# Patient Record
Sex: Female | Born: 1955 | Race: White | Hispanic: No | Smoking: Current every day smoker
Health system: Southern US, Community
[De-identification: ages and names within clinical notes are randomized; demographics above are authoritative.]

## PROBLEM LIST (undated history)

## (undated) DIAGNOSIS — M1990 Unspecified osteoarthritis, unspecified site: Secondary | ICD-10-CM

## (undated) DIAGNOSIS — I509 Heart failure, unspecified: Secondary | ICD-10-CM

## (undated) DIAGNOSIS — I10 Essential (primary) hypertension: Secondary | ICD-10-CM

---

## 1998-08-31 ENCOUNTER — Other Ambulatory Visit: Admission: RE | Admit: 1998-08-31 | Discharge: 1998-08-31 | Payer: Self-pay | Admitting: Obstetrics & Gynecology

## 1998-09-01 ENCOUNTER — Inpatient Hospital Stay (HOSPITAL_COMMUNITY): Admission: AD | Admit: 1998-09-01 | Discharge: 1998-09-01 | Payer: Self-pay | Admitting: Obstetrics & Gynecology

## 2000-04-27 ENCOUNTER — Other Ambulatory Visit: Admission: RE | Admit: 2000-04-27 | Discharge: 2000-04-27 | Payer: Self-pay | Admitting: Obstetrics & Gynecology

## 2001-09-13 ENCOUNTER — Other Ambulatory Visit: Admission: RE | Admit: 2001-09-13 | Discharge: 2001-09-13 | Payer: Self-pay | Admitting: Obstetrics & Gynecology

## 2001-10-11 ENCOUNTER — Encounter: Admission: RE | Admit: 2001-10-11 | Discharge: 2001-10-11 | Payer: Self-pay

## 2004-07-10 ENCOUNTER — Other Ambulatory Visit: Admission: RE | Admit: 2004-07-10 | Discharge: 2004-07-10 | Payer: Self-pay | Admitting: Internal Medicine

## 2004-08-19 ENCOUNTER — Encounter: Admission: RE | Admit: 2004-08-19 | Discharge: 2004-08-19 | Payer: Self-pay | Admitting: Internal Medicine

## 2012-03-22 ENCOUNTER — Other Ambulatory Visit: Payer: Self-pay | Admitting: Family Medicine

## 2012-03-22 DIAGNOSIS — Z780 Asymptomatic menopausal state: Secondary | ICD-10-CM

## 2012-03-22 DIAGNOSIS — Z1231 Encounter for screening mammogram for malignant neoplasm of breast: Secondary | ICD-10-CM

## 2012-04-19 ENCOUNTER — Ambulatory Visit: Payer: Self-pay

## 2012-04-19 ENCOUNTER — Other Ambulatory Visit: Payer: Self-pay

## 2012-05-17 ENCOUNTER — Ambulatory Visit
Admission: RE | Admit: 2012-05-17 | Discharge: 2012-05-17 | Disposition: A | Discharge status: Home / Self Care | Payer: PRIVATE HEALTH INSURANCE | Source: Ambulatory Visit | Attending: Family Medicine | Admitting: Family Medicine

## 2012-05-17 DIAGNOSIS — Z1231 Encounter for screening mammogram for malignant neoplasm of breast: Secondary | ICD-10-CM

## 2013-12-16 ENCOUNTER — Encounter (HOSPITAL_COMMUNITY): Payer: Self-pay | Admitting: Emergency Medicine

## 2013-12-16 ENCOUNTER — Emergency Department (HOSPITAL_COMMUNITY)
Admission: EM | Admit: 2013-12-16 | Discharge: 2013-12-16 | Disposition: A | Discharge status: Home / Self Care | Payer: PRIVATE HEALTH INSURANCE | Attending: Emergency Medicine | Admitting: Emergency Medicine

## 2013-12-16 ENCOUNTER — Emergency Department (HOSPITAL_COMMUNITY): Payer: PRIVATE HEALTH INSURANCE

## 2013-12-16 DIAGNOSIS — K625 Hemorrhage of anus and rectum: Secondary | ICD-10-CM | POA: Insufficient documentation

## 2013-12-16 DIAGNOSIS — M1990 Unspecified osteoarthritis, unspecified site: Secondary | ICD-10-CM | POA: Diagnosis not present

## 2013-12-16 DIAGNOSIS — H6123 Impacted cerumen, bilateral: Secondary | ICD-10-CM | POA: Diagnosis not present

## 2013-12-16 DIAGNOSIS — Z7982 Long term (current) use of aspirin: Secondary | ICD-10-CM | POA: Diagnosis not present

## 2013-12-16 DIAGNOSIS — Z720 Tobacco use: Secondary | ICD-10-CM | POA: Diagnosis not present

## 2013-12-16 DIAGNOSIS — Z79899 Other long term (current) drug therapy: Secondary | ICD-10-CM | POA: Insufficient documentation

## 2013-12-16 DIAGNOSIS — R42 Dizziness and giddiness: Secondary | ICD-10-CM | POA: Diagnosis present

## 2013-12-16 DIAGNOSIS — R935 Abnormal findings on diagnostic imaging of other abdominal regions, including retroperitoneum: Secondary | ICD-10-CM | POA: Diagnosis not present

## 2013-12-16 DIAGNOSIS — R55 Syncope and collapse: Secondary | ICD-10-CM | POA: Diagnosis not present

## 2013-12-16 HISTORY — DX: Unspecified osteoarthritis, unspecified site: M19.90

## 2013-12-16 HISTORY — DX: Essential (primary) hypertension: I10

## 2013-12-16 LAB — COMPREHENSIVE METABOLIC PANEL
ALBUMIN: 4.2 g/dL (ref 3.5–5.2)
ALT: 22 U/L (ref 0–35)
ANION GAP: 12 (ref 5–15)
AST: 35 U/L (ref 0–37)
Alkaline Phosphatase: 63 U/L (ref 39–117)
BUN: 13 mg/dL (ref 6–23)
CO2: 27 mEq/L (ref 19–32)
CREATININE: 0.97 mg/dL (ref 0.50–1.10)
Calcium: 8.9 mg/dL (ref 8.4–10.5)
Chloride: 96 mEq/L (ref 96–112)
GFR calc Af Amer: 74 mL/min — ABNORMAL LOW (ref >90)
GFR calc non Af Amer: 64 mL/min — ABNORMAL LOW (ref >90)
Glucose, Bld: 67 mg/dL — ABNORMAL LOW (ref 70–99)
Potassium: 4.6 mEq/L (ref 3.7–5.3)
Sodium: 135 mEq/L — ABNORMAL LOW (ref 137–147)
TOTAL PROTEIN: 7.9 g/dL (ref 6.0–8.3)
Total Bilirubin: 0.4 mg/dL (ref 0.3–1.2)

## 2013-12-16 LAB — URINALYSIS, ROUTINE W REFLEX MICROSCOPIC
Bilirubin Urine: NEGATIVE
Glucose, UA: NEGATIVE mg/dL
Hgb urine dipstick: NEGATIVE
Ketones, ur: NEGATIVE mg/dL
LEUKOCYTES UA: NEGATIVE
Nitrite: NEGATIVE
PH: 6 (ref 5.0–8.0)
PROTEIN: NEGATIVE mg/dL
Specific Gravity, Urine: 1.006 (ref 1.005–1.030)
Urobilinogen, UA: 0.2 mg/dL (ref 0.0–1.0)

## 2013-12-16 LAB — CBC WITH DIFFERENTIAL/PLATELET
BASOS PCT: 0 % (ref 0–1)
Basophils Absolute: 0 10*3/uL (ref 0.0–0.1)
Eosinophils Absolute: 0.2 10*3/uL (ref 0.0–0.7)
Eosinophils Relative: 3 % (ref 0–5)
HEMATOCRIT: 42.9 % (ref 36.0–46.0)
Hemoglobin: 15.1 g/dL — ABNORMAL HIGH (ref 12.0–15.0)
LYMPHS ABS: 1.3 10*3/uL (ref 0.7–4.0)
LYMPHS PCT: 18 % (ref 12–46)
MCH: 36.1 pg — ABNORMAL HIGH (ref 26.0–34.0)
MCHC: 35.2 g/dL (ref 30.0–36.0)
MCV: 102.6 fL — ABNORMAL HIGH (ref 78.0–100.0)
MONO ABS: 0.5 10*3/uL (ref 0.1–1.0)
MONOS PCT: 6 % (ref 3–12)
Neutro Abs: 5.7 10*3/uL (ref 1.7–7.7)
Neutrophils Relative %: 73 % (ref 43–77)
Platelets: 149 10*3/uL — ABNORMAL LOW (ref 150–400)
RBC: 4.18 MIL/uL (ref 3.87–5.11)
RDW: 13.3 % (ref 11.5–15.5)
WBC: 7.7 10*3/uL (ref 4.0–10.5)

## 2013-12-16 LAB — ETHANOL

## 2013-12-16 LAB — I-STAT CHEM 8, ED
BUN: 13 mg/dL (ref 6–23)
CALCIUM ION: 1.09 mmol/L — AB (ref 1.12–1.23)
CHLORIDE: 98 meq/L (ref 96–112)
Creatinine, Ser: 1.1 mg/dL (ref 0.50–1.10)
GLUCOSE: 71 mg/dL (ref 70–99)
HCT: 49 % — ABNORMAL HIGH (ref 36.0–46.0)
Hemoglobin: 16.7 g/dL — ABNORMAL HIGH (ref 12.0–15.0)
Potassium: 4.1 mEq/L (ref 3.7–5.3)
Sodium: 135 mEq/L — ABNORMAL LOW (ref 137–147)
TCO2: 26 mmol/L (ref 0–100)

## 2013-12-16 LAB — CBG MONITORING, ED: GLUCOSE-CAPILLARY: 78 mg/dL (ref 70–99)

## 2013-12-16 LAB — I-STAT TROPONIN, ED: Troponin i, poc: 0.02 ng/mL (ref 0.00–0.08)

## 2013-12-16 LAB — I-STAT CG4 LACTIC ACID, ED: Lactic Acid, Venous: 1.25 mmol/L (ref 0.5–2.2)

## 2013-12-16 MED ORDER — SODIUM CHLORIDE 0.9 % IV SOLN
1000.0000 mL | Freq: Once | INTRAVENOUS | Status: AC
  Administered 2013-12-16: 1000 mL via INTRAVENOUS

## 2013-12-16 MED ORDER — IOHEXOL 300 MG/ML  SOLN
25.0000 mL | Freq: Once | INTRAMUSCULAR | Status: AC | PRN
  Administered 2013-12-16: 25 mL via ORAL

## 2013-12-16 MED ORDER — SODIUM CHLORIDE 0.9 % IV BOLUS (SEPSIS)
1000.0000 mL | Freq: Once | INTRAVENOUS | Status: AC
  Administered 2013-12-16: 1000 mL via INTRAVENOUS

## 2013-12-16 MED ORDER — IOHEXOL 300 MG/ML  SOLN
100.0000 mL | Freq: Once | INTRAMUSCULAR | Status: AC | PRN
  Administered 2013-12-16: 100 mL via INTRAVENOUS

## 2013-12-16 MED ORDER — SODIUM CHLORIDE 0.9 % IV SOLN
1000.0000 mL | INTRAVENOUS | Status: DC
  Administered 2013-12-16: 1000 mL via INTRAVENOUS

## 2013-12-16 NOTE — ED Notes (Signed)
Pt ambulated to restroom with steady gait.

## 2013-12-16 NOTE — ED Notes (Signed)
Pt returned from CT scan.

## 2013-12-16 NOTE — ED Notes (Signed)
Receive pt via EMS with c/o feeling lightheaded between the hours of 7-9 while at work. Pt continued to work until symptoms increased and she felt as if she was going to pass out. Pt went to bathroom and removed a girdle which made her feel a little better. Pt reports that she had some diarrhea normal in color. Co-workers reported that pt was pale and clammy. 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 Pt ate a banana and 3 mini muffins for breakfast with juice.

## 2013-12-16 NOTE — ED Provider Notes (Signed)
CSN: [id number]     Arrival date & time 12/16/13  1023 History   First MD Initiated Contact with Patient 12/16/13 1028     Chief Complaint  Patient presents with  . Dizziness     (Consider location/radiation/quality/duration/timing/severity/associated sxs/prior Treatment) HPI  58 year old female was brought here via EMS with near-syncopal episode. Patient reports she felt fine last night. This morning she woke up and went to work without any problem. 2 hours into her work she developed sensation of lightheadedness, skin felt cool and clammy and report diaphoresis. She continued to work but her symptoms progressed. She went to the bathroom and removed a girdle around her abdomen which makes her feels a bit better. She does not normally wear a girdle. She report having a small amounts of loose stool while in the bathroom. Denies any blood or mucus in it. Patient was walking out of the bathroom but felt as if she was going to pass out, she sat down without passing out. EMS was called. 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 EMS recording of her blood pressure was 104/70. Patient report eating breakfast this morning. She denies having any vertiginous symptoms. Reports mild urinary frequency several days ago but does not think she has a urinary tract infection. She denies any headache, double vision, neck pain, chest pain, shortness of breath, productive cough, nausea or vomiting, back pain, abnormal bleeding, or rash. She does not have a primary care Dr. Patient states she gets going through the menopause and hasn't been feeling herself. Does admit to drinking 3-4 beers nightly.  Report intermittent constipation and diarrhea.  Report having dark stools on occasion, last episode 3 weeks ago.    Past Medical History  Diagnosis Date  . Arthritis    History reviewed. No pertinent past surgical history. No family history on file. History  Substance  Use Topics  . Smoking status: Current Every Day Smoker  . Smokeless tobacco: Not on file  . Alcohol Use: Yes   OB History   Grav Para Term Preterm Abortions TAB SAB Ect Mult Living                 Review of Systems  All other systems reviewed and are negative.     Allergies  Review of patient's allergies indicates no known allergies.  Home Medications   Prior to Admission medications   Medication Sig Start Date End Date Taking? Authorizing Provider  aspirin EC 81 MG tablet Take 81 mg by mouth daily.   Yes Historical Provider, MD  Multiple Vitamin (MULTIVITAMIN WITH MINERALS) TABS tablet Take 1 tablet by mouth daily.   Yes Historical Provider, MD  naphazoline-pheniramine (NAPHCON-A) 0.025-0.3 % ophthalmic solution Place 2 drops into both eyes 4 (four) times daily as needed for irritation.   Yes Historical Provider, MD   BP 123/87  Pulse 88  Temp(Src) 97.6 F (36.4 C) (Oral)  Resp 24  Ht 5\' 6"  (1.676 m)  Wt 156 lb (70.761 kg)  BMI 25.19 kg/m2  SpO2 100% Physical Exam  Nursing note and vitals reviewed. Constitutional: She is oriented to person, place, and time. She appears well-developed and well-nourished. No distress.  HENT:  Head: Atraumatic.  Ears: Cerumen impaction to both ears.  Eyes: Conjunctivae and EOM are normal. Pupils are equal, round, and reactive to light.  Neck: Neck supple. No JVD present.  Cardiovascular: Normal rate and regular rhythm.   Pulmonary/Chest: Effort normal and breath sounds  normal. No respiratory distress.  Abdominal: Soft. There is no tenderness.  Neurological: She is alert and oriented to person, place, and time.  Neurologic exam:  Speech clear, pupils equal round reactive to light, extraocular movements intact  Normal peripheral visual fields Cranial nerves III through XII normal including no facial droop Follows commands, moves all extremities x4, normal strength to bilateral upper and lower extremities at all major muscle groups  including grip Sensation normal to light touch  Coordination intact, no limb ataxia, finger-nose-finger normal No pronator drift Gait normal   Skin: No rash noted.  Psychiatric: She has a normal mood and affect.    ED Course  Procedures (including critical care time)  11:00 AM Patient here with a near syncopal episode. She has no vertiginous symptoms and no focal neuro deficit on exam. She felt better without any specific treatment aside from removing a girdle that she was wearing. Near syncope workup initiated.  2:36 PM Patient is afebrile with normal vital sign. Normal orthostatic vital sign as  Well.  UA without evidence of urinary tract infection, other electrolytes are reassuring, her EKG is unremarkable. Abdominal and pelvis CT scan demonstrates moderate right hydronephrosis and hydroureter to the level of S1 where there is an abrupt transition to normal caliber ureter. This appearance can be seen with a stricture but a ureteral lesion cannot be excluded. The remainder of the CT scan was unremarkable. I discussed findings with patient with recommendation for close follow up with urology, patient agrees. For her intermittent rectal bleeding, and without evidence of anemia, patient will followup with allergy and for further care. Resources provided. Alcohol cessation discussed. Return precautions discussed. Suspect symptoms likely from vasovagal, for wearing a tight girdle.   Labs Review Labs Reviewed  CBC WITH DIFFERENTIAL - Abnormal; Notable for the following:    Hemoglobin 15.1 (*)    MCV 102.6 (*)    MCH 36.1 (*)    Platelets 149 (*)    All other components within normal limits  COMPREHENSIVE METABOLIC PANEL - Abnormal; Notable for the following:    Sodium 135 (*)    Glucose, Bld 67 (*)    GFR calc non Af Amer 64 (*)    GFR calc Af Amer 74 (*)    All other components within normal limits  I-STAT CHEM 8, ED - Abnormal; Notable for the following:    Sodium 135 (*)    Calcium,  Ion 1.09 (*)    Hemoglobin 16.7 (*)    HCT 49.0 (*)    All other components within normal limits  URINALYSIS, ROUTINE W REFLEX MICROSCOPIC  ETHANOL  I-STAT CG4 LACTIC ACID, ED  I-STAT TROPOININ, ED  CBG MONITORING, ED    Imaging Review Ct Abdomen Pelvis W Contrast  12/16/2013   CLINICAL DATA:  Dizziness, diarrhea, low abdominal pain  EXAM: CT ABDOMEN AND PELVIS WITH CONTRAST  TECHNIQUE: Multidetector CT imaging of the abdomen and pelvis was performed using the standard protocol following bolus administration of intravenous contrast.  CONTRAST:  OMNIPAQUE IOHEXOL 300 MG/ML  SOLN  COMPARISON:  None.  FINDINGS: The lung bases are clear.  The liver demonstrates no focal abnormality. There is no intrahepatic or extrahepatic biliary ductal dilatation. The gallbladder is normal. The spleen demonstrates no focal abnormality.There is moderate right hydronephrosis and hydroureter to the level of S1 where there is transition to a normal caliber ureter. The left kidney, adrenal glands and pancreas are normal. The bladder is unremarkable.  Small hiatal hernia. The  stomach, duodenum, small intestine, and large intestine demonstrate no contrast extravasation or dilatation. There is no pneumoperitoneum, pneumatosis, or portal venous gas. There is no abdominal or pelvic free fluid. There is no lymphadenopathy.  The abdominal aorta is normal in caliber.  There are no lytic or sclerotic osseous lesions.  IMPRESSION: 1. Moderate right hydronephrosis and hydroureter to the level of S1 where there is abrupt transition to a normal caliber ureter. This appearance can be seen with a stricture but a ureteral lesion cannot be excluded.   Electronically Signed   By: Elige Ko   On: 12/16/2013 14:05     EKG Interpretation   Date/Time:  Friday December 16 2013 10:28:52 EDT Ventricular Rate:  87 PR Interval:  127 QRS Duration: 72 QT Interval:  389 QTC Calculation: 468 R Axis:   28 Text Interpretation:  Sinus  rhythm Probable left atrial enlargement  Probable anteroseptal infarct, old Electrode noise No old tracing to  compare Confirmed by KNAPP  MD-I, IVA ([id number]) on 12/16/2013 11:55:36 AM      MDM   Final diagnoses:  Abnormal abdominal CT scan  Rectal bleeding  Near syncope    BP 126/89  Pulse 95  Temp(Src) 98 F (36.7 C) (Oral)  Resp 18  Ht 5\' 6"  (1.676 m)  Wt 156 lb (70.761 kg)  BMI 25.19 kg/m2  SpO2 99%  I have reviewed nursing notes and vital signs. I personally reviewed the imaging tests through PACS system  I reviewed available ER/hospitalization records thought the EMR    Fayrene Helper, [place] 12/16/13 1439

## 2013-12-16 NOTE — ED Provider Notes (Signed)
Patient presents after having a near syncopal episode at work today. She states she initially got to work at 7 AM and she started feeling lightheaded like she was going to pass out. She states it got very intense about 8:30 she had to sit down. She states she feels drained of all her energy. She states she feels cold and she feels hot. She reports sweating. She reports for the past year she's been having constipation alternating with diarrhea. She states sometimes she has black stools and other times she has bright red rectal bleeding. She states the last time she had a black stool was about 10 days ago and the last time she had rectal bleeding was about 3 weeks ago when she had a severe episode of diarrhea. She denies any weight loss. She states she's eating normally. She denies chest pain or shortness of breath but does have some lower abdominal discomfort. She had nausea today without vomiting or diarrhea. She denies having actual loss of consciousness. EMS reports her initial blood pressure was in the 70s. She states she refused IV fluids because she thought she could drink. She reports she used to take [facility] powders however when she stopped them rectal bleeding seem to improve.  PCP none  Patient reports she smokes one half to one pack a day. Patient works in nursing home. She states she drinks 3-6 beers daily.  On exam patient has a mild paleness however she is not pale in her palms or her conjunctiva. Her lungs are clear, her abdomen is soft with active bowel sounds and minimal lower abdominal discomfort to palpation.   Medical screening examination/treatment/procedure(s) were conducted as a shared visit with non-physician practitioner(s) and myself.  I personally evaluated the patient during the encounter.   EKG Interpretation   Date/Time:  Friday December 16 2013 10:28:52 EDT Ventricular Rate:  87 PR Interval:  127 QRS Duration: 72 QT Interval:  389 QTC Calculation: 468 R Axis:   28 Text  Interpretation:  Sinus rhythm Probable left atrial enlargement  Probable anteroseptal infarct, old Electrode noise No old tracing to  compare Confirmed by Davian Hanshaw  MD-I, Topaz Raglin ([id number]) on 12/16/2013 11:55:36 AM       Devoria Albe, MD, Franz Dell, MD 12/16/13 [phone]

## 2013-12-16 NOTE — Discharge Instructions (Signed)
You have been evaluated for your near passing out episode.  Your work up has been unremarkable.  There is an abnormal finding on your abdominal CT scan which demonstrates an enlargement of your right ureter.  Please follow up with [facility] for further evaluation.  As for your rectal bleeding, follow up closely with [facility] if bleeding worsening.  Use resources below to find a primary care provider.  Return if you have any concerns.   Near-Syncope Near-syncope (commonly known as near fainting) is sudden weakness, dizziness, or feeling like you might pass out. This can happen when getting up or while standing for a long time. It is caused by a sudden decrease in blood flow to the brain, which can occur for various reasons. Most of the reasons are not serious.  HOME CARE Watch your condition for any changes.  Have someone stay with you until you feel stable.  If you feel like you are going to pass out:  Lie down right away.  Prop your feet up if you can.  Breathe deeply and steadily.  Move only when the feeling has gone away. Most of the time, this feeling lasts only a few minutes. You may feel tired for several hours.  Drink enough fluids to keep your pee (urine) clear or pale yellow.  If you are taking blood pressure or heart medicine, stand up slowly.  Follow up with your doctor as told. GET HELP RIGHT AWAY IF:   You have a severe headache.  You have unusual pain in the chest, belly (abdomen), or back.  You have bleeding from the mouth or butt (rectum), or you have black or tarry poop (stool).  You feel your heart beat differently than normal, or you have a very fast pulse.  You pass out, or you twitch and shake when you pass out.  You pass out when sitting or lying down.  You feel confused.  You have trouble walking.  You are weak.  You have vision problems. MAKE SURE YOU:   Understand these instructions.  Will watch your condition.  Will get help right  away if you are not doing well or get worse. Document Released: 08/13/2007 Document Revised: 03/01/2013 Document Reviewed: 07/30/2012 [facility] Patient Information 2015 [place], [place]. This information is not intended to replace advice given to you by your health care provider. Make sure you discuss any questions you have with your health care provider.   Emergency Department Resource Guide 1) Find a Doctor and Pay Out of Pocket Although you won't have to find out who is covered by your insurance plan, it is a good idea to ask around and get recommendations. You will then need to call the office and see if the doctor you have chosen will accept you as a new patient and what types of options they offer for patients who are self-pay. Some doctors offer discounts or will set up payment plans for their patients who do not have insurance, but you will need to ask so you aren't surprised when you get to your appointment.  2) Contact Your Local Health Department Not all health departments have doctors that can see patients for sick visits, but many do, so it is worth a call to see if yours does. If you don't know where your local health department is, you can check in your phone book. The CDC also has a tool to help you locate your state's health department, and many state websites also have listings of all  of their local health departments.  3) Find a Walk-in Clinic If your illness is not likely to be very severe or complicated, you may want to try a walk in clinic. These are popping up all over the country in pharmacies, drugstores, and shopping centers. They're usually staffed by nurse practitioners or physician assistants that have been trained to treat common illnesses and complaints. They're usually fairly quick and inexpensive. However, if you have serious medical issues or chronic medical problems, these are probably not your best option.  No Primary Care Doctor: - Call Health Connect at  [phone] -  they can help you locate a primary care doctor that  accepts your insurance, provides certain services, etc. - Physician Referral Service- [phone]  Chronic Pain Problems: Organization         Address  Phone   Notes  [facility]  [phone] Patients need to be referred by their primary care doctor.   Medication Assistance: Organization         Address  Phone   Notes  [facility] Medication [facility] [address] [place], [place] [address] [phone] --Must be a resident of [facility] -- Must have NO insurance coverage whatsoever (no Medicaid/ Medicare, etc.) -- The pt. MUST have a primary care doctor that directs their care regularly and follows them in the community   [facility]ist  [phone]   [employer]  [phone]    Agencies that provide inexpensive medical care: Organization         Address  Phone   Notes  [facility]  [phone]   [facility]    [phone]   [facility] [address] [place], [place] [address] [phone]   [facility] [address] [place], [place] [phone]   Planned Parenthood    [phone]   [facility]    [phone]   [facility] and [facility]  [address], [place] Phone:  [phone], Fax:  [phone] Hours of Operation:  9 am - 6 pm, M-F.  Also accepts Medicaid/Medicare and self-pay.  [facility]  [address], [place] Phone: [phone], Fax: [phone]. Hours of Operation:  8:30 am - 5:30 pm, M-F.  Also accepts Medicaid and self-pay.  [facility] [place] [address] [place] Phone: [phone]   [facility] [address] Natasha Bence [place], [place] [phone], Ext. [phone] Mondays & Thursdays: 7-9 AM.  First 15 patients are seen on a first come, first serve basis.    Medicaid-accepting  [facility] Providers:  Organization         Address  Phone   Notes  [facility] [address], [place] [phone] Also accepts self-pay patients.  [facility] [address] Laurell Josephs [place], [place]  [phone]   [facility] [address], [place] [phone]   [facility] [address], [place] [phone]   Renaye Rakers [address], [place]   [phone] Only accepts [place] patients after they have their name applied to their card.   Self-Pay (no insurance) in [facility]:  Organization         Address  Phone   Notes  Sickle  Cell Patients, [facility] [address] [place], [place] [phone]   [facility] [address] [place], [place] [phone]   [facility]  [address], [place] [phone]   [facility]/Dr. Osei-Bonsu  [address] [place] or [address], [place] [phone] Phone number for both [place] and [place] locations is the same.  [facility] [address] [place] [phone]   [facility] [address] [place] or [address] [phone]   [facility] [address] [place] [phone], phone; [phone], fax Sees patients 1st and 3rd Saturday of every month.  Must not qualify for public or private insurance (i.e. Medicaid, Medicare, [place] Health Choice, Veterans' Benefits)  Household income should be no more than 200% of the poverty level The clinic cannot treat you if you are pregnant or think you are pregnant  Sexually transmitted diseases are not treated at the clinic.    Dental Care: Organization         Address  Phone  Notes  [facility] Department of [facility] [address] [place], [place] [phone] Accepts children up to age 64 who are enrolled in [place] or [place] Health Choice; pregnant women with a Medicaid card; and children who have applied for Medicaid or [place] Health Choice, but were declined, whose parents can pay a reduced fee at time of service.  [facility] Department of [facility]  [address], [place] [phone] Accepts children up to age 72 who are enrolled in [place] or [place] Health Choice; pregnant women with a Medicaid card; and children who have applied for Medicaid or  Health Choice, but were declined, whose parents can pay a reduced fee at time of service.  [facility]  [address] [place], [place] [phone] Patients are seen by appointment only. Walk-ins are not accepted. [facility] will see patients 41 years of age and older. Monday - Tuesday (8am-5pm) Most Wednesdays (8:30-5pm) $30 per visit, cash only  [facility]  [address], [facility] [phone] Patients are seen by appointment only. Walk-ins are not accepted. [facility] will see patients 97 years of age and older. One Wednesday Evening (Monthly: Volunteer Based).  $30 per visit, cash only  [employer]  [phone] for adults; Children under age 52, call Graduate Pediatric Dentistry at [phone]. Children aged 11-14, please call [phone] to request a pediatric application.  Dental services are provided in all areas of dental care including fillings, crowns and bridges, complete and partial dentures, implants, gum treatment, root canals, and extractions. Preventive care is also provided. Treatment is provided to both adults and children. Patients are selected via a lottery and there is often a waiting list.   [facility] [address], [place]  [phone] [email or web address]   [facility] [address] [place], [place] [phone], Ext. [phone] Second and Fourth Thursday of each month, opens at 6:30 AM; Clinic ends at 9 AM.  Patients are seen on a first-come first-served basis, and a limited number are seen during each clinic.   [facility]  [address] Ether Griffins [place], [place] [phone]   Eligibility Requirements  You must have lived in [place], [place], or [place] counties for at least the last three months.   You cannot be eligible for state or federal sponsored [place]healthcare insurance, including CIG[employer]eterans Administration, [place]icaid, or [employer]icare.   You generally cannot be eligible for healthcare insurance through your employer.    How to apply: Eligibility screenings are held every Tuesday and Wednesday afternoon from 1:00 pm until 4:00 pm. You do not need an appointment for the interview!  [facility] [address], [place], [place] [phone]   [facility]  [phone]   [facility]  [phone]   [facility]  [phone]    Behavioral Health Resources in the Community: Intensive Outpatient Programs Organization         Address  Phone  Notes  [facility] [address], [place], [place] [phone]   [facility] [address], [place], [place] [phone]   [facility] [address], [place], [place]  [phone]   [facility] [address],  [place], [place] [phone] or [phone]   Substance Abuse Resources Organization         Address  Phone  Notes  [facility]  [phone]   [facility]  [phone]   [facility]  [phone]   [facility]  [phone]   Residential & Outpatient Substance Abuse Program  [phone]   Psychological Services Organization         Address  Phone  Notes  [facility]   [phone]- [phone]   [facility]  [phone]- [phone]   [facility] [address], [place] [phone] or [phone]    Mobile Crisis Teams Organization         Address  Phone  Notes  [facility], Mobile Crisis Care Unit  [phone]   [facility]  [address] [place], [place] [phone]   Doristine LocksSharon DeEsch [address] [place] [phone]    Self-Help/Support Groups Organization         Address  Phone             Notes  [facility] [place] - variety of support groups  [phone] Call for more information  Narcotics Anonymous (NA), [facility] [address], [place]  2 meetings at this location   Statisticianesidential Treatment Programs Organization         Address  Phone  Notes  [facility] [address],    [place]  [phone]   [facility]  [address], [place] [phone], [place], [place] [phone]   [facility] [address], [place] [phone] Admissions: 8am-3pm M-F  [facility] [address]    [place], [place] [phone]   [facility] [address], [place], [place] [phone]   [facility] [address]  [place], [place] [phone]   [facility] - Intensive Outpatient [address], [place], [place] [phone]   [facility] ([facility] [address]  [place], [place] [phone] or [phone]   [facility] ([facility]) [address] [place], [place] [phone] Accepts Medicaid  [facility] [address]  [place] [phone] Substance Abuse/Addiction Treatment   [facility] Resources Organization         Address  Phone  Notes  [facility]  [phone]   Angie Fava, PhD [address] Ervin Knack [place], [place]   [phone] or  [phone]   [facility]   [address] [place], [place] [phone]   [facility] [address] [place], [place] [phone] Insurance/Medicaid/sponsorship through [employer] and Families [address]                                    [place], [place] [phone] Therapy/tele-psych/case  [facility] [address], [place] [phone]    Dr. Lolly Mustache  [phone]   [facility]  [employer] [facility] Dept. 1) [address], [place] 2) [address] [place] 3)  [address], [place] [phone]  [phone]   [facility] Child Abuse Hotline (646)[phone] or [phone] (After Hours)

## 2013-12-16 NOTE — ED Provider Notes (Signed)
See prior note   Ward Givens, MD 12/16/13 1443

## 2014-08-30 ENCOUNTER — Other Ambulatory Visit: Payer: Self-pay

## 2014-08-30 DIAGNOSIS — Z1231 Encounter for screening mammogram for malignant neoplasm of breast: Secondary | ICD-10-CM

## 2014-09-04 ENCOUNTER — Ambulatory Visit
Admission: RE | Admit: 2014-09-04 | Discharge: 2014-09-04 | Disposition: A | Discharge status: Home / Self Care | Payer: PRIVATE HEALTH INSURANCE | Source: Ambulatory Visit

## 2014-09-04 DIAGNOSIS — Z1231 Encounter for screening mammogram for malignant neoplasm of breast: Secondary | ICD-10-CM

## 2014-09-05 ENCOUNTER — Other Ambulatory Visit: Payer: Self-pay | Admitting: Nurse Practitioner

## 2014-09-05 DIAGNOSIS — R928 Other abnormal and inconclusive findings on diagnostic imaging of breast: Secondary | ICD-10-CM

## 2014-09-15 ENCOUNTER — Ambulatory Visit
Admission: RE | Admit: 2014-09-15 | Discharge: 2014-09-15 | Disposition: A | Discharge status: Home / Self Care | Payer: PRIVATE HEALTH INSURANCE | Source: Ambulatory Visit | Attending: Nurse Practitioner | Admitting: Nurse Practitioner

## 2014-09-15 DIAGNOSIS — R928 Other abnormal and inconclusive findings on diagnostic imaging of breast: Secondary | ICD-10-CM

## 2014-10-27 ENCOUNTER — Other Ambulatory Visit: Payer: Self-pay | Admitting: Gastroenterology

## 2017-02-04 ENCOUNTER — Other Ambulatory Visit: Payer: Self-pay | Admitting: Family Medicine

## 2017-02-04 DIAGNOSIS — Z1231 Encounter for screening mammogram for malignant neoplasm of breast: Secondary | ICD-10-CM

## 2017-02-04 DIAGNOSIS — M8580 Other specified disorders of bone density and structure, unspecified site: Secondary | ICD-10-CM

## 2017-02-27 ENCOUNTER — Emergency Department (HOSPITAL_COMMUNITY): Payer: PRIVATE HEALTH INSURANCE

## 2017-02-27 ENCOUNTER — Encounter (HOSPITAL_COMMUNITY): Payer: Self-pay

## 2017-02-27 ENCOUNTER — Inpatient Hospital Stay (HOSPITAL_COMMUNITY)
Admission: EM | Admit: 2017-02-27 | Discharge: 2017-03-11 | DRG: 216 | Disposition: A | Discharge status: Home Health | Payer: PRIVATE HEALTH INSURANCE | Attending: Cardiothoracic Surgery | Admitting: Cardiothoracic Surgery

## 2017-02-27 DIAGNOSIS — I081 Rheumatic disorders of both mitral and tricuspid valves: Secondary | ICD-10-CM | POA: Diagnosis present

## 2017-02-27 DIAGNOSIS — I110 Hypertensive heart disease with heart failure: Secondary | ICD-10-CM | POA: Diagnosis present

## 2017-02-27 DIAGNOSIS — J9811 Atelectasis: Secondary | ICD-10-CM | POA: Diagnosis present

## 2017-02-27 DIAGNOSIS — I5021 Acute systolic (congestive) heart failure: Secondary | ICD-10-CM | POA: Diagnosis present

## 2017-02-27 DIAGNOSIS — F17210 Nicotine dependence, cigarettes, uncomplicated: Secondary | ICD-10-CM | POA: Diagnosis present

## 2017-02-27 DIAGNOSIS — Z79899 Other long term (current) drug therapy: Secondary | ICD-10-CM | POA: Diagnosis not present

## 2017-02-27 DIAGNOSIS — F329 Major depressive disorder, single episode, unspecified: Secondary | ICD-10-CM | POA: Diagnosis present

## 2017-02-27 DIAGNOSIS — I440 Atrioventricular block, first degree: Secondary | ICD-10-CM | POA: Diagnosis present

## 2017-02-27 DIAGNOSIS — I25110 Atherosclerotic heart disease of native coronary artery with unstable angina pectoris: Secondary | ICD-10-CM | POA: Diagnosis not present

## 2017-02-27 DIAGNOSIS — Z720 Tobacco use: Secondary | ICD-10-CM | POA: Diagnosis present

## 2017-02-27 DIAGNOSIS — I509 Heart failure, unspecified: Secondary | ICD-10-CM

## 2017-02-27 DIAGNOSIS — E871 Hypo-osmolality and hyponatremia: Secondary | ICD-10-CM | POA: Diagnosis present

## 2017-02-27 DIAGNOSIS — R739 Hyperglycemia, unspecified: Secondary | ICD-10-CM | POA: Diagnosis present

## 2017-02-27 DIAGNOSIS — N179 Acute kidney failure, unspecified: Secondary | ICD-10-CM | POA: Diagnosis present

## 2017-02-27 DIAGNOSIS — D696 Thrombocytopenia, unspecified: Secondary | ICD-10-CM | POA: Diagnosis present

## 2017-02-27 DIAGNOSIS — E039 Hypothyroidism, unspecified: Secondary | ICD-10-CM | POA: Diagnosis present

## 2017-02-27 DIAGNOSIS — Z8249 Family history of ischemic heart disease and other diseases of the circulatory system: Secondary | ICD-10-CM | POA: Diagnosis not present

## 2017-02-27 DIAGNOSIS — R945 Abnormal results of liver function studies: Secondary | ICD-10-CM | POA: Diagnosis not present

## 2017-02-27 DIAGNOSIS — D62 Acute posthemorrhagic anemia: Secondary | ICD-10-CM | POA: Diagnosis not present

## 2017-02-27 DIAGNOSIS — J810 Acute pulmonary edema: Secondary | ICD-10-CM

## 2017-02-27 DIAGNOSIS — M1990 Unspecified osteoarthritis, unspecified site: Secondary | ICD-10-CM | POA: Diagnosis present

## 2017-02-27 DIAGNOSIS — J449 Chronic obstructive pulmonary disease, unspecified: Secondary | ICD-10-CM | POA: Diagnosis present

## 2017-02-27 DIAGNOSIS — I700 Atherosclerosis of aorta: Secondary | ICD-10-CM | POA: Diagnosis present

## 2017-02-27 DIAGNOSIS — I272 Pulmonary hypertension, unspecified: Secondary | ICD-10-CM | POA: Diagnosis present

## 2017-02-27 DIAGNOSIS — I471 Supraventricular tachycardia: Secondary | ICD-10-CM | POA: Diagnosis present

## 2017-02-27 DIAGNOSIS — R7989 Other specified abnormal findings of blood chemistry: Secondary | ICD-10-CM | POA: Diagnosis present

## 2017-02-27 DIAGNOSIS — Z952 Presence of prosthetic heart valve: Secondary | ICD-10-CM

## 2017-02-27 DIAGNOSIS — Z79890 Hormone replacement therapy (postmenopausal): Secondary | ICD-10-CM

## 2017-02-27 DIAGNOSIS — J9601 Acute respiratory failure with hypoxia: Secondary | ICD-10-CM | POA: Diagnosis present

## 2017-02-27 DIAGNOSIS — I501 Left ventricular failure: Secondary | ICD-10-CM | POA: Diagnosis present

## 2017-02-27 DIAGNOSIS — I340 Nonrheumatic mitral (valve) insufficiency: Secondary | ICD-10-CM | POA: Diagnosis not present

## 2017-02-27 DIAGNOSIS — R0602 Shortness of breath: Secondary | ICD-10-CM | POA: Diagnosis present

## 2017-02-27 DIAGNOSIS — I361 Nonrheumatic tricuspid (valve) insufficiency: Secondary | ICD-10-CM | POA: Diagnosis not present

## 2017-02-27 DIAGNOSIS — I480 Paroxysmal atrial fibrillation: Secondary | ICD-10-CM | POA: Diagnosis present

## 2017-02-27 HISTORY — DX: Heart failure, unspecified: I50.9

## 2017-02-27 LAB — I-STAT ARTERIAL BLOOD GAS, ED
Acid-base deficit: 11 mmol/L — ABNORMAL HIGH (ref 0.0–2.0)
Bicarbonate: 17.6 mmol/L — ABNORMAL LOW (ref 20.0–28.0)
O2 Saturation: 99 %
PCO2 ART: 48.6 mmHg — AB (ref 32.0–48.0)
PH ART: 7.166 — AB (ref 7.350–7.450)
PO2 ART: 181 mmHg — AB (ref 83.0–108.0)
Patient temperature: 98.6
TCO2: 19 mmol/L — ABNORMAL LOW (ref 22–32)

## 2017-02-27 LAB — COMPREHENSIVE METABOLIC PANEL
ALBUMIN: 3.2 g/dL — AB (ref 3.5–5.0)
ALK PHOS: 83 U/L (ref 38–126)
ALT: 82 U/L — AB (ref 14–54)
AST: 93 U/L — AB (ref 15–41)
Anion gap: 14 (ref 5–15)
BUN: 17 mg/dL (ref 6–20)
CALCIUM: 8.1 mg/dL — AB (ref 8.9–10.3)
CO2: 17 mmol/L — AB (ref 22–32)
CREATININE: 1.19 mg/dL — AB (ref 0.44–1.00)
Chloride: 104 mmol/L (ref 101–111)
GFR calc Af Amer: 57 mL/min — ABNORMAL LOW (ref >60)
GFR calc non Af Amer: 49 mL/min — ABNORMAL LOW (ref >60)
GLUCOSE: 304 mg/dL — AB (ref 65–99)
Potassium: 3.9 mmol/L (ref 3.5–5.1)
SODIUM: 135 mmol/L (ref 135–145)
Total Bilirubin: 0.9 mg/dL (ref 0.3–1.2)
Total Protein: 6.1 g/dL — ABNORMAL LOW (ref 6.5–8.1)

## 2017-02-27 LAB — BRAIN NATRIURETIC PEPTIDE: B NATRIURETIC PEPTIDE 5: 1451.3 pg/mL — AB (ref 0.0–100.0)

## 2017-02-27 LAB — CBC WITH DIFFERENTIAL/PLATELET
BASOS PCT: 0 %
Basophils Absolute: 0 10*3/uL (ref 0.0–0.1)
EOS PCT: 2 %
Eosinophils Absolute: 0.3 10*3/uL (ref 0.0–0.7)
HCT: 41.9 % (ref 36.0–46.0)
Hemoglobin: 13.5 g/dL (ref 12.0–15.0)
LYMPHS ABS: 6 10*3/uL — AB (ref 0.7–4.0)
Lymphocytes Relative: 41 %
MCH: 32.6 pg (ref 26.0–34.0)
MCHC: 32.2 g/dL (ref 30.0–36.0)
MCV: 101.2 fL — AB (ref 78.0–100.0)
MONO ABS: 1 10*3/uL (ref 0.1–1.0)
Monocytes Relative: 7 %
NEUTROS ABS: 7.4 10*3/uL (ref 1.7–7.7)
NEUTROS PCT: 50 %
PLATELETS: 192 10*3/uL (ref 150–400)
RBC: 4.14 MIL/uL (ref 3.87–5.11)
RDW: 13.9 % (ref 11.5–15.5)
WBC: 14.7 10*3/uL — ABNORMAL HIGH (ref 4.0–10.5)

## 2017-02-27 LAB — I-STAT CHEM 8, ED
BUN: 16 mg/dL (ref 6–20)
CREATININE: 1 mg/dL (ref 0.44–1.00)
Calcium, Ion: 1.08 mmol/L — ABNORMAL LOW (ref 1.15–1.40)
Chloride: 104 mmol/L (ref 101–111)
Glucose, Bld: 299 mg/dL — ABNORMAL HIGH (ref 65–99)
HEMATOCRIT: 43 % (ref 36.0–46.0)
Hemoglobin: 14.6 g/dL (ref 12.0–15.0)
Potassium: 3.9 mmol/L (ref 3.5–5.1)
Sodium: 138 mmol/L (ref 135–145)
TCO2: 19 mmol/L — AB (ref 22–32)

## 2017-02-27 LAB — ETHANOL: Alcohol, Ethyl (B): <10 mg/dL (ref <10)

## 2017-02-27 LAB — I-STAT TROPONIN, ED: Troponin i, poc: 0.01 ng/mL (ref 0.00–0.08)

## 2017-02-27 LAB — PROTIME-INR
INR: 1.21
Prothrombin Time: 15.2 seconds (ref 11.4–15.2)

## 2017-02-27 LAB — CBG MONITORING, ED: GLUCOSE-CAPILLARY: 299 mg/dL — AB (ref 65–99)

## 2017-02-27 MED ORDER — ACETAMINOPHEN 650 MG RE SUPP: 650.0000 mg | Freq: Four times a day (QID) | RECTAL | Status: DC | PRN

## 2017-02-27 MED ORDER — LORAZEPAM 2 MG/ML IJ SOLN
INTRAMUSCULAR | Status: AC
  Filled 2017-02-27: qty 1

## 2017-02-27 MED ORDER — NICOTINE 14 MG/24HR TD PT24
14.0000 mg | MEDICATED_PATCH | Freq: Every day | TRANSDERMAL | Status: DC
  Administered 2017-02-27 – 2017-03-03 (×5): 14 mg via TRANSDERMAL
  Filled 2017-02-27 (×5): qty 1

## 2017-02-27 MED ORDER — ESCITALOPRAM OXALATE 10 MG PO TABS
20.0000 mg | ORAL_TABLET | Freq: Every day | ORAL | Status: DC
  Administered 2017-02-27 – 2017-03-03 (×5): 20 mg via ORAL
  Filled 2017-02-27 (×7): qty 2

## 2017-02-27 MED ORDER — ACETAMINOPHEN 325 MG PO TABS
650.0000 mg | ORAL_TABLET | Freq: Four times a day (QID) | ORAL | Status: DC | PRN
  Administered 2017-02-28 – 2017-03-01 (×2): 650 mg via ORAL
  Filled 2017-02-27 (×2): qty 2

## 2017-02-27 MED ORDER — ENOXAPARIN SODIUM 40 MG/0.4ML ~~LOC~~ SOLN
40.0000 mg | SUBCUTANEOUS | Status: DC
  Administered 2017-02-27 – 2017-02-28 (×2): 40 mg via SUBCUTANEOUS
  Filled 2017-02-27 (×2): qty 0.4

## 2017-02-27 MED ORDER — FUROSEMIDE 10 MG/ML IJ SOLN
40.0000 mg | Freq: Once | INTRAMUSCULAR | Status: AC
  Administered 2017-02-27: 40 mg via INTRAVENOUS
  Filled 2017-02-27: qty 4

## 2017-02-27 MED ORDER — LEVOTHYROXINE SODIUM 100 MCG PO TABS
100.0000 ug | ORAL_TABLET | Freq: Every day | ORAL | Status: DC
  Administered 2017-02-28 – 2017-03-03 (×4): 100 ug via ORAL
  Filled 2017-02-27 (×4): qty 1

## 2017-02-27 MED ORDER — MAGNESIUM SULFATE 2 GM/50ML IV SOLN
2.0000 g | Freq: Once | INTRAVENOUS | Status: AC
  Administered 2017-02-27: 2 g via INTRAVENOUS
  Filled 2017-02-27: qty 50

## 2017-02-27 MED ORDER — IOPAMIDOL (ISOVUE-370) INJECTION 76%
INTRAVENOUS | Status: AC
  Administered 2017-02-27: 100 mL
  Filled 2017-02-27: qty 100

## 2017-02-27 NOTE — ED Triage Notes (Signed)
Pt from home by [facility]. Pt called EMS saying: "I cannot breath I am dying." they got disconnected and EMS had to force entry into house. Pt has inhaler near her in the house no other history at this time. Pt was found with purple lips and blue fingers

## 2017-02-27 NOTE — Progress Notes (Signed)
Patient admitted onto 3e. Vitals take and patient resting in the room.   Diane Nielsen, Diane Nielsen

## 2017-02-27 NOTE — ED Provider Notes (Signed)
Assumed care from Dr. Rubin Payor @ 1600; see that provider's note for full H&P. In stable condition at the time of transfer. Currently awaiting CTA to evaluate for PE.  UPDATE: CTA negative for PE but again shows signs of acute heart failure.  Re-evaluated the Pt; saturations in the mid-90s on 3L [place] w/normal RR.  Will admit the Pt to medicine for further evaluation and treatment of new-onset CHF.   Forest Becker, MD 02/27/17 1744    Cathren Laine, MD 02/27/17 [phone]

## 2017-02-27 NOTE — H&P (Signed)
Family Medicine Teaching [facility] Admission History and Physical Service Pager: [phone]  Patient name: Diane Nielsen Medical record number: [id number] Date of birth: 10-27-1955 Age: 61 y.o. Gender: female  Primary Care Provider: Patient, No Pcp Per Consultants: None Code Status: Full  Chief Complaint: Shortness of breath  Assessment and Plan: Diane Nielsen is a 61 y.o. female presenting with shortness of breath found to have acute heart failure. PMH is significant for hypothyroidism, depression, arthritis, and tobacco abuse.  Shortness of breath with acute respiratory failure, likely 2/2 to acute decompensated heart failure:  Patient with increasing weakness for 1 month and acute shortness of breath over the past 24-48 hours.  Found on the floor cyanotic and with O2 sats in the 70s by EMS. Patient was initially placed on BiPAP in the ED but is now satting in the upper 90s on 3 L by [place], already with about 1 L of urine output s/p 40 mg IV lasix in ED.  CXR in ED showed CHF, with mild cardiomegaly and moderate to severe pulmonary edema.  CTA was obtained to rule out PE due to patient's hypoxemia.  CTA showed acute congestive heart failure with mild cardiomegaly and bilateral pleural effusions with no PE.  BNP was elevated to 1,451. Given 2 g of mag and 40 mg IV Lasix in the ED.  ADHF is commonly due to left ventricular systolic or diastolic dysfunction however patient with no significant cardiac history.  Patient does not have hypertension, but noted to have elevated AST 93, ALT 82. EMR shows ED visit from 2015 with patient endorsing having 3-4 beers night but EtOH < 10 on admission and denies EtOH use. Initial troponin negative and denying chest pain, so do not suspect ACS but does have new possible RBBB.  -Admit to telemetry, attending Dr. Lum Babe -Vitals per unit -Supplemental oxygen to maintain O2 sat greater than 92% -Obtain echocardiogram -Consult cardiology for management of new  onset CHF  -Strict I's and O's -Monitor respiratory status -Repeat CMP in a.m. -Repeat EKG in a.m. -Risk strat labs of TSH, A1c, lipid panel -IV lasix 20 mg ordered for a.m.  Hypothyroidism: Chronic.  -Continue home levothyroxine 100 mcg -Obtain TSH  Depression: Chronic.  Patient also with spouse passing away 6 months ago and reports that she is managing okay with this and has been in grief counseling. -Continue home Lexapro 20 mg  Tobacco abuse: Patient says she smokes a little less than half a pack a day.  Request nicotine patch while admitted.  Reports she is tried to stop before but has been unsuccessful. -Nicotine patch 14 mg  AKI: Initial SCr 1.19. Repeat 1.08. - Continue to monitor  Elevated LFTs: In setting of acute heart failure may be 2/2 volume overload. Consider hepatitis labs. Not in typical 2:1 AST:ALT for ETOH abuse and denies recent drinking. - a.m. CMP  Hyperglycemia: to 304 in ED. Was given steroids by EMS for respiratory failure. - Obtain A1c  FEN/GI: Heart healthy diet Prophylaxis: Lovenox  Disposition: Admit to telemetry  History of Present Illness:  Diane Nielsen is a 61 y.o. female presenting with shortness of breath.  Earlier today patient could not breathe and called 911 reporting that she was dying because she could not breathe.  Patient reports that she waited too long and that she had trouble breathing all night.  She reports that she became acutely worse in the past 24-48 hours.  Last night the patient had to use 2-3 pillows but  still tossed and turned.  She has had trouble laying flat for a little while.  She felt that she was hyperventilating and could not get good air in. 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  She does endorse that walking long distances in the hallways for the past month she has felt weaker.  She also reports that for a couple of months she has used an inhaler on and off to help  "open my lungs up" but not on a regular basis.   Patient reporting that her husband passed away 6 months ago.  She reports that she has had alcohol a few times since his passing but that she has not had a drink in a month.  Patient reports that she does smoke tobacco but only has less than half a pack per day.  Reports she has tried to quit but was unsuccessful in the past.  Patient only has a past medical history significant for hypothyroidism and depression.  EMS reported that her sats were in the 70s when they arrived to her house and they started her on oxygen which relieved her symptoms somewhat.  They also reported that she was cyanotic and had to enter her house by force because no one responded.  On arrival to the ED patient's ABG was 7.166, 48.6, 181, with a bicarb of 17.6.  Patient was initially placed on BiPAP.  CXR in ED showed: CHF, with mild cardiomegaly and moderate to severe pulmonary edema.  CTA was obtained to rule out PE due to patient's hypoxemia.  CTA showed acute congestive heart failure with mild cardiomegaly and bilateral pleural effusions with no PE.  A BNP was obtained which was elevated to 1, 451.  Initial troponin was negative.  Given 2 g of mag and 40 mg IV Lasix in the ED.  Review Of Systems: Per HPI with the following additions:   Review of Systems  Constitutional: Positive for chills and fever.  HENT: Negative for congestion and sore throat.   Eyes: Negative for double vision and pain.  Respiratory: Positive for shortness of breath. Negative for cough.   Cardiovascular: Positive for palpitations and orthopnea. Negative for chest pain and leg swelling.  Gastrointestinal: Negative for abdominal pain and constipation.  Genitourinary: Negative for dysuria and urgency.       Reports some trouble not going as much.  Musculoskeletal: Positive for falls (mechanical while mopping).  Skin: Negative for rash.  Neurological: Positive for weakness. Negative for sensory change  and headaches.  Psychiatric/Behavioral: Negative for suicidal ideas.    Patient Active Problem List   Diagnosis Date Noted  . Acute exacerbation of CHF (congestive heart failure) (HCC) 02/27/2017    Past Medical History: Past Medical History:  Diagnosis Date  . Acute exacerbation of CHF (congestive heart failure) (HCC) 02/27/2017  . Arthritis   . Hypertension     Past Surgical History: History reviewed. No pertinent surgical history.  Social History: Social History   Tobacco Use  . Smoking status: Current Every Day Smoker  Substance Use Topics  . Alcohol use: Yes  . Drug use: No   Additional social history: last drink was 1 month ago, smokes half a pack per day, no illicit drugs Please also refer to relevant sections of EMR.  Family History: No family history on file. No known heart or lung problems.   Allergies and Medications: No Known Allergies No current facility-administered medications on file  prior to encounter.    Current Outpatient Medications on File Prior to Encounter  Medication Sig Dispense Refill  . acetaminophen (TYLENOL) 325 MG tablet Take 650 mg by mouth every 6 (six) hours as needed for headache (pain).    . Aspirin-Salicylamide-Caffeine (BC HEADACHE POWDER PO) Take 1 packet by mouth 2 (two) times daily as needed (pain/headache).    . escitalopram (LEXAPRO) 20 MG tablet Take 20 mg by mouth at bedtime.  0  . Ipratropium Bromide (ATROVENT IN) Inhale 2 puffs into the lungs 2 (two) times daily as needed (shortness of breath/wheezing).    Marland Kitchen. levothyroxine (SYNTHROID, LEVOTHROID) 100 MCG tablet Take 100 mcg by mouth daily before breakfast.  1  . Multiple Vitamin (MULTIVITAMIN WITH MINERALS) TABS tablet Take 1 tablet by mouth daily.    Marland Kitchen. OVER THE COUNTER MEDICATION Take 1 tablet by mouth daily. Hildred PriestSebra dietary supplement      Objective: BP 138/78   Pulse 96   Resp (!) 23   SpO2 97%  Exam: General: NAD, pleasant Eyes: PERRL, EOMI, no conjunctival  pallor or injection ENTM: Moist mucous membranes, no pharyngeal erythema or exudate Neck: Supple, no LAD Cardiovascular: tachycardic with regular rhythm, no m/r/g, no LE edema Respiratory: CTA in upper lobes, crackles noted in BL bases, normal work of breathing on 3L  Gastrointestinal: soft, nontender, nondistended, normoactive BS MSK: moves 4 extremities equally, normal tone Derm: no rashes appreciated Neuro: CN II-XII grossly intact, no focal deficits Psych: AOx3, appropriate affect   Labs and Imaging: CBC BMET  Recent Labs  Lab 02/27/17 1300 02/27/17 1314  WBC 14.7*  --   HGB 13.5 14.6  HCT 41.9 43.0  PLT 192  --    Recent Labs  Lab 02/27/17 1300 02/27/17 1314  NA 135 138  K 3.9 3.9  CL 104 104  CO2 17*  --   BUN 17 16  CREATININE 1.19* 1.00  GLUCOSE 304* 299*  CALCIUM 8.1*  --      BNP 1,451.3  Ct Angio Chest Pe W And/or Wo Contrast  Result Date: 02/27/2017 CLINICAL DATA:  Strain shortness of breath since last night. Found unresponsive and cyanotic. Smoker. EXAM: CT ANGIOGRAPHY CHEST WITH CONTRAST TECHNIQUE: Multidetector CT imaging of the chest was performed using the standard protocol during bolus administration of intravenous contrast. Multiplanar CT image reconstructions and MIPs were obtained to evaluate the vascular anatomy. CONTRAST:  100mL ISOVUE-370 IOPAMIDOL (ISOVUE-370) INJECTION 76% COMPARISON:  Chest radiographs obtained earlier today. FINDINGS: Cardiovascular: Normally opacified pulmonary arteries with no pulmonary arterial filling defects. Mildly enlarged heart. Atheromatous calcifications, including the aorta and coronary arteries. Mediastinum/Nodes: No enlarged mediastinal, hilar, or axillary lymph nodes. Thyroid gland, trachea, and esophagus demonstrate no significant findings. Lungs/Pleura: Small bilateral pleural effusions. Patchy interstitial prominence in both lungs. Diffusely prominent pulmonary vasculature. Bilateral dependent lower lobe  atelectasis. Upper Abdomen: Atheromatous arterial calcifications. Musculoskeletal: Thoracic spine degenerative changes. Review of the MIP images confirms the above findings. IMPRESSION: 1. Changes of acute congestive heart failure with mild cardiomegaly and bilateral pleural effusions. 2. No pulmonary emboli. 3. Bilateral lower lobe atelectasis. 4. Atheromatous calcifications, including the coronary arteries and aorta. Aortic Atherosclerosis (ICD10-I70.0). Electronically Signed   By: Beckie SaltsSteven  Reid M.D.   On: 02/27/2017 16:37   Dg Chest Portable 1 View  Result Date: 02/27/2017 CLINICAL DATA:  61 year old with acute onset of severe shortness of breath and cyanosis. Current smoker. Patient currently on BiPAP. EXAM: PORTABLE CHEST 1 VIEW COMPARISON:  None. FINDINGS: Cardiac silhouette mildly enlarged.  Thoracic aorta atherosclerotic. Moderate to severe diffuse interstitial pulmonary edema with associated mild airspace pulmonary edema. No visible pleural effusions. IMPRESSION: CHF, with mild cardiomegaly and moderate to severe pulmonary edema. Electronically Signed   By: Hulan Saas M.D.   On: 02/27/2017 13:19    Shirley, [place], DO 02/27/2017, 7:23 PM PGY-1,  Family Medicine FPTS Intern pager: [phone], text pages welcome  Upper Level Addendum:  I have seen and evaluated this patient along with Dr. Talbert Forest and reviewed the above note, making necessary revisions in green.  Casey Burkitt, MD PGY-3,  [facility] 02/27/2017 11:13 PM

## 2017-02-27 NOTE — ED Provider Notes (Addendum)
[facility] EMERGENCY DEPARTMENT Provider Note   CSN: [id number] Arrival date & time: 02/27/17  1247     History   Chief Complaint Chief Complaint  Patient presents with  . Respiratory Distress    HPI BETZAIRA HILES is a 61 y.o. female. Level 5 caveat due to altered mental status HPI Patient presents with shortness of breath.  Initial altered mental status at home.  Reportedly called EMS saying she was dying and EMS had to force entry into house.  Found unresponsive and cyanotic.  Upon arrival patient cannot really provide much history although is more reactive than upon arrival per EMS.EMS gave steroids and albuterol treatments. Past Medical History:  Diagnosis Date  . Arthritis   . Hypertension     There are no active problems to display for this patient.   History reviewed. No pertinent surgical history.  OB History    No data available       Home Medications    Prior to Admission medications   Medication Sig Start Date End Date Taking? Authorizing Provider  aspirin EC 81 MG tablet Take 81 mg by mouth daily.    [provider]  Multiple Vitamin (MULTIVITAMIN WITH MINERALS) TABS tablet Take 1 tablet by mouth daily.    [provider]  naphazoline-pheniramine (NAPHCON-A) 0.025-0.3 % ophthalmic solution Place 2 drops into both eyes 4 (four) times daily as needed for irritation.    [provider]    Family History No family history on file.  Social History Social History   Tobacco Use  . Smoking status: Current Every Day Smoker  Substance Use Topics  . Alcohol use: Yes  . Drug use: No     Allergies   Patient has no known allergies.   Review of Systems Review of Systems  Unable to perform ROS: Mental status change     Physical Exam Updated Vital Signs BP 117/83   Pulse 92   Resp (!) 26   SpO2 91%   Physical Exam  Constitutional: She appears well-developed.  HENT:  Head: Normocephalic.    Eyes: Pupils are equal, round, and reactive to light.  Neck: Neck supple.  Cardiovascular:  Tachycardia  Pulmonary/Chest: She has wheezes.  Diffuse wheezes and prolonged expirations.  With some respiratory distress.  Abdominal: There is no tenderness.  Musculoskeletal: She exhibits no edema.  Neurological: She is alert.  Patient is alert but somewhat confused.  Unable to really provide any history.  Continues to try and pull off her mask.  Skin: Skin is warm. Capillary refill takes less than 2 seconds.     ED Treatments / Results  Labs (all labs ordered are listed, but only abnormal results are displayed) Labs Reviewed  COMPREHENSIVE METABOLIC PANEL - Abnormal; Notable for the following components:      Result Value   CO2 17 (*)    Glucose, Bld 304 (*)    Creatinine, Ser 1.19 (*)    Calcium 8.1 (*)    Total Protein 6.1 (*)    Albumin 3.2 (*)    AST 93 (*)    ALT 82 (*)    GFR calc non Af Amer 49 (*)    GFR calc Af Amer 57 (*)    All other components within normal limits  CBC WITH DIFFERENTIAL/PLATELET - Abnormal; Notable for the following components:   WBC 14.7 (*)    MCV 101.2 (*)    Lymphs Abs 6.0 (*)    All other components  within normal limits  BRAIN NATRIURETIC PEPTIDE - Abnormal; Notable for the following components:   B Natriuretic Peptide 1,451.3 (*)    All other components within normal limits  I-STAT ARTERIAL BLOOD GAS, ED - Abnormal; Notable for the following components:   pH, Arterial 7.166 (*)    pCO2 arterial 48.6 (*)    pO2, Arterial 181.0 (*)    Bicarbonate 17.6 (*)    TCO2 19 (*)    Acid-base deficit 11.0 (*)    All other components within normal limits  I-STAT CHEM 8, ED - Abnormal; Notable for the following components:   Glucose, Bld 299 (*)    Calcium, Ion 1.08 (*)    TCO2 19 (*)    All other components within normal limits  CBG MONITORING, ED - Abnormal; Notable for the following components:   Glucose-Capillary 299 (*)    All other  components within normal limits  ETHANOL  PROTIME-INR  I-STAT TROPONIN, ED    EKG  EKG Interpretation None       Radiology Dg Chest Portable 1 View  Result Date: 02/27/2017 CLINICAL DATA:  61 year old with acute onset of severe shortness of breath and cyanosis. Current smoker. Patient currently on BiPAP. EXAM: PORTABLE CHEST 1 VIEW COMPARISON:  None. FINDINGS: Cardiac silhouette mildly enlarged. Thoracic aorta atherosclerotic. Moderate to severe diffuse interstitial pulmonary edema with associated mild airspace pulmonary edema. No visible pleural effusions. IMPRESSION: CHF, with mild cardiomegaly and moderate to severe pulmonary edema. Electronically Signed   By: Hulan Saashomas  Lawrence M.D.   On: 02/27/2017 13:19    Procedures Procedures (including critical care time)  Medications Ordered in ED Medications  LORazepam (ATIVAN) 2 MG/ML injection (not administered)  magnesium sulfate IVPB 2 g 50 mL (0 g Intravenous Stopped 02/27/17 1414)     Initial Impression / Assessment and Plan / ED Course  I have reviewed the triage vital signs and the nursing notes.  Pertinent labs & imaging results that were available during my care of the patient were reviewed by me and considered in my medical decision making (see chart for details).     Patient presented in respiratory distress.  Improved on BiPAP.  Initial sounded very tight but later more wet at the base.  Mental status much improved.  Seasonal vomit as a primary.  She continues to smoke but does not have a history of CHF.  BNP elevated.  Will do CT PE due to hypoxia but potentially could be new CHF exacerbation.  Will require admission to hospital but results pending.  Care turned over to Dr. Denton LankSteinl.  CRITICAL CARE Performed by: Benjiman CoreNathan Meryle Pugmire Total critical care time: 30 minutes Critical care time was exclusive of separately billable procedures and treating other patients. Critical care was necessary to treat or prevent imminent or  life-threatening deterioration. Critical care was time spent personally by me on the following activities: development of treatment plan with patient and/or surrogate as well as nursing, discussions with consultants, evaluation of patient's response to treatment, examination of patient, obtaining history from patient or surrogate, ordering and performing treatments and interventions, ordering and review of laboratory studies, ordering and review of radiographic studies, pulse oximetry and re-evaluation of patient's condition.  Final Clinical Impressions(s) / ED Diagnoses   Final diagnoses:  None    ED Discharge Orders    None      Benjiman CorePickering, Carrissa Taitano, MD 02/27/17 1520  Benjiman CorePickering, Levent Kornegay, MD 02/27/17 1606

## 2017-02-27 NOTE — ED Notes (Signed)
CBG 299; RN aware

## 2017-02-27 NOTE — ED Notes (Signed)
Attempted report 

## 2017-02-27 NOTE — ED Notes (Signed)
RT at bedside putting pt on Bipap

## 2017-02-28 ENCOUNTER — Inpatient Hospital Stay (HOSPITAL_COMMUNITY): Payer: PRIVATE HEALTH INSURANCE

## 2017-02-28 ENCOUNTER — Other Ambulatory Visit: Payer: Self-pay

## 2017-02-28 DIAGNOSIS — I361 Nonrheumatic tricuspid (valve) insufficiency: Secondary | ICD-10-CM

## 2017-02-28 LAB — ECHOCARDIOGRAM COMPLETE
CHL CUP DOP CALC LVOT VTI: 14.6 cm
CHL CUP MV DEC (S): 324
E/e' ratio: 25.94
EWDT: 324 ms
FS: 31 % (ref 28–44)
HEIGHTINCHES: 65 in
IV/PV OW: 0.9
LA diam end sys: 62 mm
LA diam index: 3.46 cm/m2
LA vol A4C: 130 ml
LASIZE: 62 mm
LAVOL: 141 mL
LAVOLIN: 78.8 mL/m2
LV PW d: 10 mm — AB (ref 0.6–1.1)
LV TDI E'LATERAL: 7.94
LV TDI E'MEDIAL: 6.09
LV e' LATERAL: 7.94 cm/s
LVEEAVG: 25.94
LVEEMED: 25.94
LVOT area: 3.14 cm2
LVOTD: 20 mm
LVOTPV: 80.6 cm/s
LVOTSV: 46 mL
Lateral S' vel: 12.2 cm/s
MV Peak grad: 17 mmHg
MV pk A vel: 65.8 m/s
MVPKEVEL: 206 m/s
PISA EROA: 0.32 cm2
TAPSE: 22.8 mm
VTI: 149 cm
WEIGHTICAEL: 2432 [oz_av]

## 2017-02-28 LAB — CBC
HCT: 35.6 % — ABNORMAL LOW (ref 36.0–46.0)
HEMOGLOBIN: 12.1 g/dL (ref 12.0–15.0)
MCH: 32.4 pg (ref 26.0–34.0)
MCHC: 34 g/dL (ref 30.0–36.0)
MCV: 95.2 fL (ref 78.0–100.0)
PLATELETS: 154 10*3/uL (ref 150–400)
RBC: 3.74 MIL/uL — AB (ref 3.87–5.11)
RDW: 13.5 % (ref 11.5–15.5)
WBC: 12.8 10*3/uL — ABNORMAL HIGH (ref 4.0–10.5)

## 2017-02-28 LAB — LIPID PANEL
CHOLESTEROL: 136 mg/dL (ref 0–200)
HDL: 41 mg/dL (ref >40)
LDL Cholesterol: 85 mg/dL (ref 0–99)
TRIGLYCERIDES: 48 mg/dL (ref <150)
Total CHOL/HDL Ratio: 3.3 RATIO
VLDL: 10 mg/dL (ref 0–40)

## 2017-02-28 LAB — COMPREHENSIVE METABOLIC PANEL
ALK PHOS: 69 U/L (ref 38–126)
ALT: 77 U/L — AB (ref 14–54)
ANION GAP: 10 (ref 5–15)
AST: 66 U/L — ABNORMAL HIGH (ref 15–41)
Albumin: 3.3 g/dL — ABNORMAL LOW (ref 3.5–5.0)
BILIRUBIN TOTAL: 0.9 mg/dL (ref 0.3–1.2)
BUN: 13 mg/dL (ref 6–20)
CALCIUM: 8.5 mg/dL — AB (ref 8.9–10.3)
CO2: 27 mmol/L (ref 22–32)
CREATININE: 0.86 mg/dL (ref 0.44–1.00)
Chloride: 101 mmol/L (ref 101–111)
Glucose, Bld: 99 mg/dL (ref 65–99)
Potassium: 3.7 mmol/L (ref 3.5–5.1)
Sodium: 138 mmol/L (ref 135–145)
TOTAL PROTEIN: 6.3 g/dL — AB (ref 6.5–8.1)

## 2017-02-28 LAB — TSH: TSH: 2.548 u[IU]/mL (ref 0.350–4.500)

## 2017-02-28 LAB — HEMOGLOBIN A1C
HEMOGLOBIN A1C: 6 % — AB (ref 4.8–5.6)
Mean Plasma Glucose: 125.5 mg/dL

## 2017-02-28 MED ORDER — FUROSEMIDE 10 MG/ML IJ SOLN: 40.0000 mg | Freq: Once | INTRAMUSCULAR | Status: DC

## 2017-02-28 MED ORDER — FUROSEMIDE 10 MG/ML IJ SOLN
20.0000 mg | Freq: Once | INTRAMUSCULAR | Status: AC
  Administered 2017-02-28: 20 mg via INTRAVENOUS
  Filled 2017-02-28: qty 2

## 2017-02-28 NOTE — Plan of Care (Signed)
  Clinical Measurements: Diagnostic test results will improve 02/28/2017 2259 - Adequate for Discharge by Sheryle Hail, RN   Clinical Measurements: Respiratory complications will improve 02/28/2017 2259 - Adequate for Discharge by Eliyah Bazzi A, RN   Activity: Risk for activity intolerance will decrease 02/28/2017 2259 - Adequate for Discharge by Doll Frazee, Marlana Salvage, RN

## 2017-02-28 NOTE — Progress Notes (Signed)
  Echocardiogram 2D Echocardiogram has been performed.  Delcie Roch 02/28/2017, 10:01 AM

## 2017-02-28 NOTE — Progress Notes (Signed)
Family Medicine Teaching Service Daily Progress Note Intern Pager: [phone]  Patient name: Diane Nielsen Medical record number: [id number] Date of birth: [birth year] Age: 61 y.o. Gender: female  Primary Care Provider: Patient, No Pcp Per Consultants: None Code Status: Full  Chief Complaint: Shortness of breath  Assessment and Plan: Diane NapSuzanne H Canevari is a 61 y.o. female presenting with shortness of breath found to have acute heart failure. PMH is significant for hypothyroidism, depression, arthritis, and tobacco abuse.  Shortness of breath with acute respiratory failure, likely 2/2 to acute decompensated heart failure:  Patient initially required bipap, now on 2L by [place]. UOP 1.4 L overnight, Weight down 0.8 lbs s/p 40 IV lasix.  CXR in ED showed CHF, with mild cardiomegaly and moderate to severe pulmonary edema.  CTA negative for PE but with mild cardiomegaly and bilateral pleural effusions. BNP was elevated to 1,451. No known cardiac history. Initial troponin negative and denying chest pain, so do not suspect ACS but does have new possible RBBB.  -On telemetry -Vitals per unit -IV lasix 20 mg ordered for a.m. -Supplemental oxygen to maintain O2 sat greater than 92%; wean O2 as able -Obtain echocardiogram; in process -Consulted cardiology for establishment of care and recommendations on further management -Strict I's and O's -Monitor respiratory status -Repeat EKG ordered -Risk strat labs of TSH, A1c, lipid panel --> A1c 6.0. Cholesterol 136, LDL 85.   Hypothyroidism: Chronic. TSH 12/22 WNL at 2.548.  -Continue home levothyroxine 100 mcg  Depression: Chronic. Patient also with spouse passing away 6 months ago and reports that she is managing okay with this and has been in grief counseling. -Continue home Lexapro 20 mg  Tobacco abuse: Patient says she smokes a little less than half a pack a day.  Requests nicotine patch while admitted. Previous cessation attempts  unsuccessful. -Nicotine patch 14 mg  AKI: Resolved. Initial SCr 1.19 > 1.00 > 0.86 - Continue to monitor  Elevated LFTs: Transaminitis slightly improved (AST 93 > 66. ALT 82 > 77). In setting of acute heart failure may be 2/2 volume overload. Consider hepatitis labs. Not in typical 2:1 AST:ALT for ETOH abuse and denies recent drinking.  - trend CMP  Hyperglycemia: Resolved this a.m. At 99 on CMP. To 304 in ED s/p steroids by EMS for respiratory failure. A1c 6.0 on 02/27/17.   FEN/GI: Heart healthy diet Prophylaxis: Lovenox  Disposition: Admit to telemetry  Subjective:  Patient feeling much improved. Able to sleep with head of bed only slightly elevated. No chest pain.   Objective: Temp:  [97.6 F (36.4 C)-99.4 F (37.4 C)] 98.7 F (37.1 C) (12/22 0806) Pulse Rate:  [70-121] 89 (12/22 0806) Resp:  [15-32] 18 (12/22 0806) BP: (98-160)/(54-106) 103/77 (12/22 0806) SpO2:  [74 %-100 %] 98 % (12/22 0806) FiO2 (%):  [100 %] 100 % (12/21 1300) Weight:  [152 lb (68.9 kg)-152 lb 12.8 oz (69.3 kg)] 152 lb (68.9 kg) (12/22 0414) Physical Exam: General: NAD, pleasant, sitting up in bed with neighbor visiting at bedside Cardiovascular: RRR, s1 s2, no m/r/g, no LE edema Respiratory: No increased WOB, [place] in place on 2L. Mild bibasilar crackles, improved from yesterday's exam. Gastrointestinal: soft, nontender, nondistended, normoactive BS Neuro: CN II-XII grossly intact, no focal deficits Psych: AOx3, appropriate affect  Laboratory: Recent Labs  Lab 02/27/17 1300 02/27/17 1314 02/28/17 0633  WBC 14.7*  --  12.8*  HGB 13.5 14.6 12.1  HCT 41.9 43.0 35.6*  PLT 192  --  154   Recent Labs  Lab 02/27/17 1300 02/27/17 1314 02/28/17 0633  NA 135 138 138  K 3.9 3.9 3.7  CL 104 104 101  CO2 17*  --  27  BUN 17 16 13   CREATININE 1.19* 1.00 0.86  CALCIUM 8.1*  --  8.5*  PROT 6.1*  --  6.3*  BILITOT 0.9  --  0.9  ALKPHOS 83  --  69  ALT 82*  --  77*  AST 93*  --  66*   GLUCOSE 304* 299* 99   Imaging/Diagnostic Tests: Ct Angio Chest Pe W And/or Wo Contrast  IMPRESSION: 1. Changes of acute congestive heart failure with mild cardiomegaly and bilateral pleural effusions. 2. No pulmonary emboli. 3. Bilateral lower lobe atelectasis. 4. Atheromatous calcifications, including the coronary arteries and aorta. Aortic Atherosclerosis (ICD10-I70.0). Electronically Signed   By: Beckie Salts M.D.   On: 02/27/2017 16:37   Dg Chest Portable 1 View  Result Date: 02/27/2017 IMPRESSION: CHF, with mild cardiomegaly and moderate to severe pulmonary edema. Electronically Signed   By: Hulan Saas M.D.   On: 02/27/2017 13:19   Casey Burkitt, MD 02/28/2017, 8:38 AM PGY-3, [facility] Family Medicine FPTS Intern pager: [phone], text pages welcome

## 2017-02-28 NOTE — Consult Note (Signed)
Reason for Consult: Acute decompensated congestive heart failure Referring Physician: [facility] hospitalist  Diane Nielsen is an 61 y.o. female.  HPI: Patient is 61 year old female with past medical history significant for hypothyroidism, depression, tobacco abuse, smokes half pack per day for 30+ years, family history of coronary artery disease grandmother died in her 32s due to MI, was admitted yesterday because of progressive worsening shortness of breath associated with feeling tired fatigued for last Monday did not seek medical attention initially but as her breathing got worst so decided to call EMS. Patient denies any chest pain, nausea vomiting or diaphoresis. Patient denies any history of PND orthopnea or leg swelling. Denies palpitation lightheadedness or syncopal episode. Denies such episodes in the past. Denies any history of rheumatic fever as a child. Patient was noted to be in acute pulmonary edema requiring BiPAP and Lasix with improvement in her symptoms. EKG showed sinus tachycardia left atrial enlargement and poor R-wave progression and nonspecific ST-T wave changes.  Past Medical History:  Diagnosis Date  . Acute exacerbation of CHF (congestive heart failure) ([place]) 02/27/2017  . Arthritis   . Hypertension     History reviewed. No pertinent surgical history.  No family history on file.  Social History:  reports that she has been smoking.  She does not have any smokeless tobacco history on file. She reports that she drinks alcohol. She reports that she does not use drugs.  Allergies: No Known Allergies  Medications: I have reviewed the patient's current medications.  Results for orders placed or performed during the hospital encounter of 02/27/17 (from the past 48 hour(s))  Comprehensive metabolic panel     Status: Abnormal   Collection Time: 02/27/17  1:00 PM  Result Value Ref Range   Sodium 135 135 - 145 mmol/L   Potassium 3.9 3.5 - 5.1 mmol/L   Chloride 104 101 - 111  mmol/L   CO2 17 (L) 22 - 32 mmol/L   Glucose, Bld 304 (H) 65 - 99 mg/dL   BUN 17 6 - 20 mg/dL   Creatinine, Ser 1.19 (H) 0.44 - 1.00 mg/dL   Calcium 8.1 (L) 8.9 - 10.3 mg/dL   Total Protein 6.1 (L) 6.5 - 8.1 g/dL   Albumin 3.2 (L) 3.5 - 5.0 g/dL   AST 93 (H) 15 - 41 U/L   ALT 82 (H) 14 - 54 U/L   Alkaline Phosphatase 83 38 - 126 U/L   Total Bilirubin 0.9 0.3 - 1.2 mg/dL   GFR calc non Af Amer 49 (L) >60 mL/min   GFR calc Af Amer 57 (L) >60 mL/min    Comment: (NOTE) The eGFR has been calculated using the CKD EPI equation. This calculation has not been validated in all clinical situations. eGFR's persistently <60 mL/min signify possible Chronic Kidney Disease.    Anion gap 14 5 - 15  CBC with Differential     Status: Abnormal   Collection Time: 02/27/17  1:00 PM  Result Value Ref Range   WBC 14.7 (H) 4.0 - 10.5 K/uL   RBC 4.14 3.87 - 5.11 MIL/uL   Hemoglobin 13.5 12.0 - 15.0 g/dL   HCT 41.9 36.0 - 46.0 %   MCV 101.2 (H) 78.0 - 100.0 fL    Comment: REPEATED TO VERIFY   MCH 32.6 26.0 - 34.0 pg   MCHC 32.2 30.0 - 36.0 g/dL   RDW 13.9 11.5 - 15.5 %   Platelets 192 150 - 400 K/uL   Neutrophils Relative % 50 %  Lymphocytes Relative 41 %   Monocytes Relative 7 %   Eosinophils Relative 2 %   Basophils Relative 0 %   Neutro Abs 7.4 1.7 - 7.7 K/uL   Lymphs Abs 6.0 (H) 0.7 - 4.0 K/uL   Monocytes Absolute 1.0 0.1 - 1.0 K/uL   Eosinophils Absolute 0.3 0.0 - 0.7 K/uL   Basophils Absolute 0.0 0.0 - 0.1 K/uL   WBC Morphology MILD LEFT SHIFT (1-5% METAS, OCC MYELO, OCC BANDS)   Protime-INR     Status: None   Collection Time: 02/27/17  1:00 PM  Result Value Ref Range   Prothrombin Time 15.2 11.4 - 15.2 seconds   INR 1.21   Brain natriuretic peptide     Status: Abnormal   Collection Time: 02/27/17  1:00 PM  Result Value Ref Range   B Natriuretic Peptide 1,451.3 (H) 0.0 - 100.0 pg/mL  I-stat troponin, ED     Status: None   Collection Time: 02/27/17  1:12 PM  Result Value Ref Range    Troponin i, poc 0.01 0.00 - 0.08 ng/mL   Comment 3            Comment: Due to the release kinetics of cTnI, a negative result within the first hours of the onset of symptoms does not rule out myocardial infarction with certainty. If myocardial infarction is still suspected, repeat the test at appropriate intervals.   I-stat Chem 8, ED     Status: Abnormal   Collection Time: 02/27/17  1:14 PM  Result Value Ref Range   Sodium 138 135 - 145 mmol/L   Potassium 3.9 3.5 - 5.1 mmol/L   Chloride 104 101 - 111 mmol/L   BUN 16 6 - 20 mg/dL   Creatinine, Ser 1.00 0.44 - 1.00 mg/dL   Glucose, Bld 299 (H) 65 - 99 mg/dL   Calcium, Ion 1.08 (L) 1.15 - 1.40 mmol/L   TCO2 19 (L) 22 - 32 mmol/L   Hemoglobin 14.6 12.0 - 15.0 g/dL   HCT 43.0 36.0 - 46.0 %  CBG monitoring, ED     Status: Abnormal   Collection Time: 02/27/17  1:14 PM  Result Value Ref Range   Glucose-Capillary 299 (H) 65 - 99 mg/dL  Ethanol     Status: None   Collection Time: 02/27/17  1:23 PM  Result Value Ref Range   Alcohol, Ethyl (B) <10 <10 mg/dL    Comment:        LOWEST DETECTABLE LIMIT FOR SERUM ALCOHOL IS 10 mg/dL FOR MEDICAL PURPOSES ONLY   I-Stat arterial blood gas, ED     Status: Abnormal   Collection Time: 02/27/17  1:36 PM  Result Value Ref Range   pH, Arterial 7.166 (LL) 7.350 - 7.450   pCO2 arterial 48.6 (H) 32.0 - 48.0 mmHg   pO2, Arterial 181.0 (H) 83.0 - 108.0 mmHg   Bicarbonate 17.6 (L) 20.0 - 28.0 mmol/L   TCO2 19 (L) 22 - 32 mmol/L   O2 Saturation 99.0 %   Acid-base deficit 11.0 (H) 0.0 - 2.0 mmol/L   Patient temperature 98.6 F    Collection site RADIAL, ALLEN'S TEST ACCEPTABLE    Sample type ARTERIAL    Comment NOTIFIED PHYSICIAN   Hemoglobin A1c     Status: Abnormal   Collection Time: 02/28/17  6:33 AM  Result Value Ref Range   Hgb A1c MFr Bld 6.0 (H) 4.8 - 5.6 %    Comment: (NOTE) Pre diabetes:  5.7%-6.4% Diabetes:              >6.4% Glycemic control for   <7.0% adults with  diabetes    Mean Plasma Glucose 125.5 mg/dL  TSH     Status: None   Collection Time: 02/28/17  6:33 AM  Result Value Ref Range   TSH 2.548 0.350 - 4.500 uIU/mL    Comment: Performed by a 3rd Generation assay with a functional sensitivity of <=0.01 uIU/mL.  Comprehensive metabolic panel     Status: Abnormal   Collection Time: 02/28/17  6:33 AM  Result Value Ref Range   Sodium 138 135 - 145 mmol/L   Potassium 3.7 3.5 - 5.1 mmol/L   Chloride 101 101 - 111 mmol/L   CO2 27 22 - 32 mmol/L   Glucose, Bld 99 65 - 99 mg/dL   BUN 13 6 - 20 mg/dL   Creatinine, Ser 0.86 0.44 - 1.00 mg/dL   Calcium 8.5 (L) 8.9 - 10.3 mg/dL   Total Protein 6.3 (L) 6.5 - 8.1 g/dL   Albumin 3.3 (L) 3.5 - 5.0 g/dL   AST 66 (H) 15 - 41 U/L   ALT 77 (H) 14 - 54 U/L   Alkaline Phosphatase 69 38 - 126 U/L   Total Bilirubin 0.9 0.3 - 1.2 mg/dL   GFR calc non Af Amer >60 >60 mL/min   GFR calc Af Amer >60 >60 mL/min    Comment: (NOTE) The eGFR has been calculated using the CKD EPI equation. This calculation has not been validated in all clinical situations. eGFR's persistently <60 mL/min signify possible Chronic Kidney Disease.    Anion gap 10 5 - 15  CBC     Status: Abnormal   Collection Time: 02/28/17  6:33 AM  Result Value Ref Range   WBC 12.8 (H) 4.0 - 10.5 K/uL   RBC 3.74 (L) 3.87 - 5.11 MIL/uL   Hemoglobin 12.1 12.0 - 15.0 g/dL   HCT 35.6 (L) 36.0 - 46.0 %   MCV 95.2 78.0 - 100.0 fL   MCH 32.4 26.0 - 34.0 pg   MCHC 34.0 30.0 - 36.0 g/dL   RDW 13.5 11.5 - 15.5 %   Platelets 154 150 - 400 K/uL  Lipid panel     Status: None   Collection Time: 02/28/17  6:33 AM  Result Value Ref Range   Cholesterol 136 0 - 200 mg/dL   Triglycerides 48 <150 mg/dL   HDL 41 >40 mg/dL   Total CHOL/HDL Ratio 3.3 RATIO   VLDL 10 0 - 40 mg/dL   LDL Cholesterol 85 0 - 99 mg/dL    Comment:        Total Cholesterol/HDL:CHD Risk Coronary Heart Disease Risk Table                     Men   Women  1/2 Average Risk   3.4    3.3  Average Risk       5.0   4.4  2 X Average Risk   9.6   7.1  3 X Average Risk  23.4   11.0        Use the calculated Patient Ratio above and the CHD Risk Table to determine the patient's CHD Risk.        ATP III CLASSIFICATION (LDL):  <100     mg/dL   Optimal  100-129  mg/dL   Near or Above  Optimal  130-159  mg/dL   Borderline  160-189  mg/dL   High  >190     mg/dL   Very High     Ct Angio Chest Pe W And/or Wo Contrast  Result Date: 02/27/2017 CLINICAL DATA:  Strain shortness of breath since last night. Found unresponsive and cyanotic. Smoker. EXAM: CT ANGIOGRAPHY CHEST WITH CONTRAST TECHNIQUE: Multidetector CT imaging of the chest was performed using the standard protocol during bolus administration of intravenous contrast. Multiplanar CT image reconstructions and MIPs were obtained to evaluate the vascular anatomy. CONTRAST:  119m ISOVUE-370 IOPAMIDOL (ISOVUE-370) INJECTION 76% COMPARISON:  Chest radiographs obtained earlier today. FINDINGS: Cardiovascular: Normally opacified pulmonary arteries with no pulmonary arterial filling defects. Mildly enlarged heart. Atheromatous calcifications, including the aorta and coronary arteries. Mediastinum/Nodes: No enlarged mediastinal, hilar, or axillary lymph nodes. Thyroid gland, trachea, and esophagus demonstrate no significant findings. Lungs/Pleura: Small bilateral pleural effusions. Patchy interstitial prominence in both lungs. Diffusely prominent pulmonary vasculature. Bilateral dependent lower lobe atelectasis. Upper Abdomen: Atheromatous arterial calcifications. Musculoskeletal: Thoracic spine degenerative changes. Review of the MIP images confirms the above findings. IMPRESSION: 1. Changes of acute congestive heart failure with mild cardiomegaly and bilateral pleural effusions. 2. No pulmonary emboli. 3. Bilateral lower lobe atelectasis. 4. Atheromatous calcifications, including the coronary arteries and aorta. Aortic  Atherosclerosis (ICD10-I70.0). Electronically Signed   By: SClaudie ReveringM.D.   On: 02/27/2017 16:37   Dg Chest Portable 1 View  Result Date: 02/27/2017 CLINICAL DATA:  61year old with acute onset of severe shortness of breath and cyanosis. Current smoker. Patient currently on BiPAP. EXAM: PORTABLE CHEST 1 VIEW COMPARISON:  None. FINDINGS: Cardiac silhouette mildly enlarged. Thoracic aorta atherosclerotic. Moderate to severe diffuse interstitial pulmonary edema with associated mild airspace pulmonary edema. No visible pleural effusions. IMPRESSION: CHF, with mild cardiomegaly and moderate to severe pulmonary edema. Electronically Signed   By: TEvangeline DakinM.D.   On: 02/27/2017 13:19    Review of Systems  Constitutional: Positive for malaise/fatigue. Negative for chills, fever and weight loss.  HENT: Negative for hearing loss.   Eyes: Negative for blurred vision.  Respiratory: Positive for shortness of breath.   Cardiovascular: Negative for chest pain, palpitations and leg swelling.  Gastrointestinal: Negative for nausea and vomiting.  Genitourinary: Negative for dysuria.  Neurological: Negative for dizziness.   Blood pressure 107/78, pulse 90, temperature 98.7 F (37.1 C), temperature source Oral, resp. rate 18, height 5' 5" (1.651 m), weight 68.9 kg (152 lb), SpO2 97 %. Physical Exam  Constitutional: She is oriented to person, place, and time.  HENT:  Head: Normocephalic and atraumatic.  Eyes: Conjunctivae are normal. Pupils are equal, round, and reactive to light. Left eye exhibits no discharge. No scleral icterus.  Neck: Normal range of motion. Neck supple. No JVD present. No tracheal deviation present. No thyromegaly present.  Cardiovascular: Normal rate and regular rhythm. Exam reveals gallop (S3 gallop noted).  Murmur (2/6 systolic murmur noted at left lower sternal border and apex.) heard. Respiratory:  Decreased breath sound at bases with faint rales noted  GI: Soft. Bowel  sounds are normal. She exhibits no distension. There is no tenderness. There is no rebound.  Musculoskeletal: She exhibits no edema, tenderness or deformity.  Neurological: She is alert and oriented to person, place, and time.    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

## 2017-02-28 NOTE — Plan of Care (Signed)
Patient stable during 7 a to 7 p shift, able to wean off oxygen and maintain oxygen saturation on room air in the high 90s.  This RN went over living with heart failure handbook with patient and daughter, patient has scales and verbalizes she will begin weighing herself daily.  Reviewed the importance of compliance with her medications, eating low sodium foods and notifying her provider if she gains 3 lbs overnight, 5 lbs in a week or if she has worsening shortness of breath at home.  Daughter at bedside.

## 2017-03-01 ENCOUNTER — Encounter (HOSPITAL_COMMUNITY): Payer: Self-pay | Admitting: General Practice

## 2017-03-01 DIAGNOSIS — E039 Hypothyroidism, unspecified: Secondary | ICD-10-CM | POA: Diagnosis present

## 2017-03-01 DIAGNOSIS — R7989 Other specified abnormal findings of blood chemistry: Secondary | ICD-10-CM | POA: Diagnosis present

## 2017-03-01 DIAGNOSIS — R945 Abnormal results of liver function studies: Secondary | ICD-10-CM

## 2017-03-01 LAB — TROPONIN I
TROPONIN I: 0.03 ng/mL — AB (ref <0.03)
Troponin I: 0.04 ng/mL (ref <0.03)

## 2017-03-01 LAB — CBC
HEMATOCRIT: 37.7 % (ref 36.0–46.0)
HEMOGLOBIN: 12.4 g/dL (ref 12.0–15.0)
MCH: 32.2 pg (ref 26.0–34.0)
MCHC: 32.9 g/dL (ref 30.0–36.0)
MCV: 97.9 fL (ref 78.0–100.0)
Platelets: 151 10*3/uL (ref 150–400)
RBC: 3.85 MIL/uL — AB (ref 3.87–5.11)
RDW: 14 % (ref 11.5–15.5)
WBC: 10.1 10*3/uL (ref 4.0–10.5)

## 2017-03-01 LAB — COMPREHENSIVE METABOLIC PANEL
ALBUMIN: 3.3 g/dL — AB (ref 3.5–5.0)
ALT: 67 U/L — ABNORMAL HIGH (ref 14–54)
ANION GAP: 8 (ref 5–15)
AST: 47 U/L — ABNORMAL HIGH (ref 15–41)
Alkaline Phosphatase: 65 U/L (ref 38–126)
BUN: 19 mg/dL (ref 6–20)
CHLORIDE: 101 mmol/L (ref 101–111)
CO2: 28 mmol/L (ref 22–32)
Calcium: 8.4 mg/dL — ABNORMAL LOW (ref 8.9–10.3)
Creatinine, Ser: 1.02 mg/dL — ABNORMAL HIGH (ref 0.44–1.00)
GFR calc Af Amer: >60 mL/min (ref >60)
GFR calc non Af Amer: 59 mL/min — ABNORMAL LOW (ref >60)
Glucose, Bld: 106 mg/dL — ABNORMAL HIGH (ref 65–99)
POTASSIUM: 3.4 mmol/L — AB (ref 3.5–5.1)
SODIUM: 137 mmol/L (ref 135–145)
Total Bilirubin: 0.8 mg/dL (ref 0.3–1.2)
Total Protein: 6.1 g/dL — ABNORMAL LOW (ref 6.5–8.1)

## 2017-03-01 LAB — HEPARIN LEVEL (UNFRACTIONATED): HEPARIN UNFRACTIONATED: 0.49 [IU]/mL (ref 0.30–0.70)

## 2017-03-01 LAB — HIV ANTIBODY (ROUTINE TESTING W REFLEX): HIV SCREEN 4TH GENERATION: NONREACTIVE

## 2017-03-01 MED ORDER — METOPROLOL TARTRATE 5 MG/5ML IV SOLN
5.0000 mg | Freq: Once | INTRAVENOUS | Status: AC
  Administered 2017-03-01: 5 mg via INTRAVENOUS
  Filled 2017-03-01: qty 5

## 2017-03-01 MED ORDER — AMIODARONE HCL IN DEXTROSE 360-4.14 MG/200ML-% IV SOLN
30.0000 mg/h | INTRAVENOUS | Status: DC
  Administered 2017-03-01 – 2017-03-03 (×7): 30 mg/h via INTRAVENOUS
  Filled 2017-03-01 (×7): qty 200

## 2017-03-01 MED ORDER — SODIUM CHLORIDE 0.9% FLUSH
3.0000 mL | Freq: Two times a day (BID) | INTRAVENOUS | Status: DC
  Administered 2017-03-01 – 2017-03-02 (×2): 3 mL via INTRAVENOUS

## 2017-03-01 MED ORDER — SODIUM CHLORIDE 0.9% FLUSH: 3.0000 mL | INTRAVENOUS | Status: DC | PRN

## 2017-03-01 MED ORDER — SODIUM CHLORIDE 0.9 % IV SOLN
250.0000 mL | INTRAVENOUS | Status: DC | PRN
Start: 2017-03-01 — End: 2017-03-02

## 2017-03-01 MED ORDER — HEPARIN BOLUS VIA INFUSION
2000.0000 [IU] | Freq: Once | INTRAVENOUS | Status: AC
Start: 2017-03-01 — End: 2017-03-01
  Administered 2017-03-01: 2000 [IU] via INTRAVENOUS
  Filled 2017-03-01: qty 2000

## 2017-03-01 MED ORDER — AMIODARONE HCL IN DEXTROSE 360-4.14 MG/200ML-% IV SOLN
60.0000 mg/h | INTRAVENOUS | Status: AC
  Administered 2017-03-01 (×2): 60 mg/h via INTRAVENOUS
  Filled 2017-03-01 (×2): qty 200

## 2017-03-01 MED ORDER — AMIODARONE LOAD VIA INFUSION
150.0000 mg | Freq: Once | INTRAVENOUS | Status: AC
  Administered 2017-03-01: 150 mg via INTRAVENOUS
  Filled 2017-03-01: qty 83.34

## 2017-03-01 MED ORDER — ASPIRIN 81 MG PO CHEW
81.0000 mg | CHEWABLE_TABLET | ORAL | Status: AC
  Administered 2017-03-02: 81 mg via ORAL
  Filled 2017-03-01: qty 1

## 2017-03-01 MED ORDER — POTASSIUM CHLORIDE CRYS ER 20 MEQ PO TBCR
20.0000 meq | EXTENDED_RELEASE_TABLET | Freq: Once | ORAL | Status: AC
  Administered 2017-03-01: 20 meq via ORAL
  Filled 2017-03-01: qty 1

## 2017-03-01 MED ORDER — HEPARIN (PORCINE) IN NACL 100-0.45 UNIT/ML-% IJ SOLN
1200.0000 [IU]/h | INTRAMUSCULAR | Status: DC
  Administered 2017-03-01 (×2): 1000 [IU]/h via INTRAVENOUS
  Filled 2017-03-01 (×4): qty 250

## 2017-03-01 MED ORDER — SODIUM CHLORIDE 0.9 % IV SOLN
INTRAVENOUS | Status: DC
  Administered 2017-03-02: 07:00:00 via INTRAVENOUS

## 2017-03-01 NOTE — Progress Notes (Signed)
ANTICOAGULATION CONSULT NOTE - Initial Consult  Pharmacy Consult for Heparin Indication: SVT  No Known Allergies  Patient Measurements: Height: 5\' 5"  (165.1 cm) Weight: 152 lb (68.9 kg) IBW/kg (Calculated) : 57  Vital Signs: Temp: 98.3 F (36.8 C) (12/23 0020) Temp Source: Oral (12/23 0020) BP: 101/68 (12/23 0020) Pulse Rate: 95 (12/23 0020)  Labs: Recent Labs    02/27/17 1300 02/27/17 1314 02/28/17 0633  HGB 13.5 14.6 12.1  HCT 41.9 43.0 35.6*  PLT 192  --  154  LABPROT 15.2  --   --   INR 1.21  --   --   CREATININE 1.19* 1.00 0.86    Estimated Creatinine Clearance: 68.7 mL/min (by C-G formula based on SCr of 0.86 mg/dL).   Medical History: Past Medical History:  Diagnosis Date  . Acute exacerbation of CHF (congestive heart failure) ([facility]) 02/27/2017  . Arthritis   . Hypertension     Medications:  Medications Prior to Admission  Medication Sig Dispense Refill Last Dose  . acetaminophen (TYLENOL) 325 MG tablet Take 650 mg by mouth every 6 (six) hours as needed for headache (pain).   few weeks ago  . Aspirin-Salicylamide-Caffeine (BC HEADACHE POWDER PO) Take 1 packet by mouth 2 (two) times daily as needed (pain/headache).   02/26/2017 at Unknown time  . escitalopram (LEXAPRO) 20 MG tablet Take 20 mg by mouth at bedtime.  0 02/26/2017 at pm  . Ipratropium Bromide (ATROVENT IN) Inhale 2 puffs into the lungs 2 (two) times daily as needed (shortness of breath/wheezing).   02/27/2017 at am  . levothyroxine (SYNTHROID, LEVOTHROID) 100 MCG tablet Take 100 mcg by mouth daily before breakfast.  1 02/27/2017 at am  . Multiple Vitamin (MULTIVITAMIN WITH MINERALS) TABS tablet Take 1 tablet by mouth daily.   02/26/2017 at am  . OVER THE COUNTER MEDICATION Take 1 tablet by mouth daily. Hildred Priest dietary supplement   02/27/2017 at am    Assessment: 61 y.o. female with tachycardia for heparin Goal of Therapy:  Heparin level 0.3-0.7 units/ml Monitor platelets by anticoagulation  protocol: Yes   Plan:  Heparin 2000 units IV bolus, then start heparin 1000 units/hr Check heparin level in 8 hours.   Jennylee Uehara, Gary Fleet 03/01/2017,12:59 AM

## 2017-03-01 NOTE — Progress Notes (Signed)
After going to the bathroom and voided Pt started having HR sustaining in the 140's -150's.pt stated" I can feel my heart beating fast". DR Sharyn Lull made aware with orders to give Lopressor 5mg  Iv which was carried out. EKG done as ordered. pts HR now T 120'S-130'S. No c/o chest pains nor SOB. Continued to observe pt.

## 2017-03-01 NOTE — Progress Notes (Signed)
Subjective:  Patient denies any chest pain states breathing has improved. Patient had episodes of A. fib with RVR last night converted back to sinus rhythm. Review 2-D echo which showed severe mitral regurgitation with myxomatous changes in mitral valve. LV end-systolic dimension is close to 4 cm and EF is below 60%.  Objective:  Vital Signs in the last 24 hours: Temp:  [97.7 F (36.5 C)-98.6 F (37 C)] 98.3 F (36.8 C) (12/23 0452) Pulse Rate:  [72-107] 72 (12/23 0930) Resp:  [17-18] 18 (12/23 0452) BP: (88-117)/(57-90) 104/76 (12/23 0930) SpO2:  [95 %-100 %] 95 % (12/23 0452) Weight:  [68.2 kg (150 lb 6.4 oz)] 68.2 kg (150 lb 6.4 oz) (12/23 0452)  Intake/Output from previous day: 12/22 0701 - 12/23 0700 In: 240 [P.O.:240] Out: 2725 [Urine:2725] Intake/Output from this shift: No intake/output data recorded.  Physical Exam: Neck: no adenopathy, no carotid bruit, no JVD and supple, symmetrical, trachea midline Lungs: clear to auscultation bilaterally Heart: regular rate and rhythm, S1, S2 normal and 2/6 systolic murmur noted Abdomen: soft, non-tender; bowel sounds normal; no masses,  no organomegaly Extremities: extremities normal, atraumatic, no cyanosis or edema  Lab Results: Recent Labs    02/28/17 0633 03/01/17 0506  WBC 12.8* 10.1  HGB 12.1 12.4  PLT 154 151   Recent Labs    02/28/17 0633 03/01/17 0506  NA 138 137  K 3.7 3.4*  CL 101 101  CO2 27 28  GLUCOSE 99 106*  BUN 13 19  CREATININE 0.86 1.02*   Recent Labs    03/01/17 0506  TROPONINI 0.04*   Hepatic Function Panel Recent Labs    03/01/17 0506  PROT 6.1*  ALBUMIN 3.3*  AST 47*  ALT 67*  ALKPHOS 65  BILITOT 0.8   Recent Labs    02/28/17 0633  CHOL 136   No results for input(s): PROTIME in the last 72 hours.  Imaging: Imaging results have been reviewed and Ct Angio Chest Pe W And/or Wo Contrast  Result Date: 02/27/2017 CLINICAL DATA:  Strain shortness of breath since last night.  Found unresponsive and cyanotic. Smoker. EXAM: CT ANGIOGRAPHY CHEST WITH CONTRAST TECHNIQUE: Multidetector CT imaging of the chest was performed using the standard protocol during bolus administration of intravenous contrast. Multiplanar CT image reconstructions and MIPs were obtained to evaluate the vascular anatomy. CONTRAST:  ISOVUE-370 IOPAMIDOL (ISOVUE-370) INJECTION 76% COMPARISON:  Chest radiographs obtained earlier today. FINDINGS: Cardiovascular: Normally opacified pulmonary arteries with no pulmonary arterial filling defects. Mildly enlarged heart. Atheromatous calcifications, including the aorta and coronary arteries. Mediastinum/Nodes: No enlarged mediastinal, hilar, or axillary lymph nodes. Thyroid gland, trachea, and esophagus demonstrate no significant findings. Lungs/Pleura: Small bilateral pleural effusions. Patchy interstitial prominence in both lungs. Diffusely prominent pulmonary vasculature. Bilateral dependent lower lobe atelectasis. Upper Abdomen: Atheromatous arterial calcifications. Musculoskeletal: Thoracic spine degenerative changes. Review of the MIP images confirms the above findings. IMPRESSION: 1. Changes of acute congestive heart failure with mild cardiomegaly and bilateral pleural effusions. 2. No pulmonary emboli. 3. Bilateral lower lobe atelectasis. 4. Atheromatous calcifications, including the coronary arteries and aorta. Aortic Atherosclerosis (ICD10-I70.0). Electronically Signed   By: Beckie Salts M.D.   On: 02/27/2017 16:37   Dg Chest Portable 1 View  Result Date: 02/27/2017 CLINICAL DATA:  61 year old with acute onset of severe shortness of breath and cyanosis. Current smoker. Patient currently on BiPAP. EXAM: PORTABLE CHEST 1 VIEW COMPARISON:  None. FINDINGS: Cardiac silhouette mildly enlarged. Thoracic aorta atherosclerotic. Moderate to severe diffuse interstitial pulmonary edema  with associated mild airspace pulmonary edema. No visible pleural effusions.  IMPRESSION: CHF, with mild cardiomegaly and moderate to severe pulmonary edema. Electronically Signed   By: Thomas  Lawrence M.D.   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 fib with RVR Hypothyroidism Depression Tobacco abuse Plan Discussed with patient regarding 2-D echo finding and left and right cardiac catheterization and possibly need for MVR and Maze procedure its risk and benefits i.e. death MI stroke need for emergency CABG local vascular complications and consents for the procedure. Will get CVTS consult in a.m.  LOS: 2 days    Oksana Deberry 03/01/2017, 11:04 AM   

## 2017-03-01 NOTE — Progress Notes (Signed)
Family Medicine Teaching Service Daily Progress Note Intern Pager: [phone]  Patient name: Diane Nielsen Medical record number: [id number] Date of birth: 05-Jan-1956 Age: 61 y.o. Gender: female  Primary Care Provider: Patient, No Pcp Per Consultants: None Code Status: Full  Chief Complaint: Shortness of breath  Assessment and Plan: Diane Nielsen is a 61 y.o. female presenting with shortness of breath found to have acute heart failure. PMH is significant for hypothyroidism, depression, arthritis, and tobacco abuse.  Acute decompensated heart failure with pulmonary edema: 2/2 to severe symptomatic mitral regurgitation. Also on echo EF 55-60%with L atrium massively dilated. Now on room air. UOP 2.7 L overnight, Weight down 1.1kg this hospital stay. Unknown dry weight. Appears euvolemic today Unlikely ACS since istat troponin in ED neg and no chest pain, but repeat troponin today 0.04 in setting of abnormal EKG suggestive of possible old anteroseptal wall MI.  Overnight had episode of SVT to HR 150s requiring lopressor x1 and started on amiodarone due to soft BPs. -Monitor on telemetry -s/p IV lasix 20 mg x2. Hold on further diuresis since euvolemic. -Cardiology following, appreciate recommendations. On amiodarone gtt. Will need cath and possible MVR and maze procedure. CVTS consult in am  -Strict I/O's, daily weights -Monitor respiratory status -Repeat EKG ordered for this am -Repeat troponin x1 -Risk strat labs: A1c 6.0, Cholesterol 136, LDL 85 -heparin gtt for SVT  Hypothyroidism: Chronic. TSH 12/22 WNL at 2.548.  -Continue home levothyroxine 100 mcg  Depression: Chronic. Spouse died 6 months ago, patient in grief counseling and reports coping well -Continue home Lexapro 20 mg qhs  Tobacco abuse: Smokes a little less than half a pack a day.   -continue nicotine patch 14 mg qd  AKI, resolved: Initial SCr 1.19 > 1.00 > 0.86 > 1.02 - Continue to monitor  Elevated LFTs,  improving: AST 93>66>47, ALT 82>77>67. In setting of acute heart failure may be 2/2 volume overload. Not in typical 2:1 AST:ALT for ETOH abuse and denies recent drinking.  - trend CMP  FEN/GI: Heart healthy diet Prophylaxis: heparin  Disposition: pending cardiology recs  Subjective:  Overnight had fast heartbeat with palpitations. Needed to be started on amiodarone and heparin gtts for SVT. This morning she feels well without CP or palpitations. No SOB. Is emotionally feeling overwhelmed regarding the next steps regarding cardiac procedures.  Objective: Temp:  [97.7 F (36.5 C)-98.6 F (37 C)] 98.3 F (36.8 C) (12/23 0452) Pulse Rate:  [72-107] 72 (12/23 0930) Resp:  [17-18] 18 (12/23 0452) BP: (88-117)/(57-90) 104/76 (12/23 0930) SpO2:  [95 %-100 %] 95 % (12/23 0452) Weight:  [68.2 kg (150 lb 6.4 oz)] 68.2 kg (150 lb 6.4 oz) (12/23 0452) Physical Exam:  General: NAD, pleasant, sitting up on side of bed Cardiovascular: regular rhythm, S1 and S2, 2/6 systolic murmur. no LE edema Respiratory: normal WOB on room air. CTAB Gastrointestinal: soft, nontender, nondistended, normoactive BS Neuro: CN II-XII grossly intact, no focal deficits Psych: AOx3, slightly tearful  Laboratory: Recent Labs  Lab 02/27/17 1300 02/27/17 1314 02/28/17 0633 03/01/17 0506  WBC 14.7*  --  12.8* 10.1  HGB 13.5 14.6 12.1 12.4  HCT 41.9 43.0 35.6* 37.7  PLT 192  --  154 151   Recent Labs  Lab 02/27/17 1300 02/27/17 1314 02/28/17 0633 03/01/17 0506  NA 135 138 138 137  K 3.9 3.9 3.7 3.4*  CL 104 104 101 101  CO2 17*  --  27 28  BUN 17 16 13 19   CREATININE  1.19* 1.00 0.86 1.02*  CALCIUM 8.1*  --  8.5* 8.4*  PROT 6.1*  --  6.3* 6.1*  BILITOT 0.9  --  0.9 0.8  ALKPHOS 83  --  69 65  ALT 82*  --  77* 67*  AST 93*  --  66* 47*  GLUCOSE 304* 299* 99 106*   Troponin: 0.04  Imaging/Diagnostic Tests:  Transthoracic Echocardiography  Study Date: 02/28/2017 Conclusions - Left ventricle:  The cavity size was mildly dilated. Wall   thickness was normal. Systolic function was normal. The estimated   ejection fraction was in the range of 55% to 60%. - Mitral valve: MV apparatus is moderately thickened. Posterior   leaflet appears tethered There is also some restricted motion of   anterior leaflet. There is poor coaptation of leaflets in systole   with resultant severe MR - Left atrium: The atrium was massively dilated. - Right atrium: The atrium was mildly dilated. - Tricuspid valve: There was moderate regurgitation.  Ct Angio Chest Pe W And/or Wo Contrast IMPRESSION: 1. Changes of acute congestive heart failure with mild cardiomegaly and bilateral pleural effusions. 2. No pulmonary emboli. 3. Bilateral lower lobe atelectasis. 4. Atheromatous calcifications, including the coronary arteries and aorta. Aortic Atherosclerosis (ICD10-I70.0). Electronically Signed   By: Beckie Salts M.D.   On: 02/27/2017 16:37   Dg Chest Portable 1 View   Result Date: 02/27/2017 IMPRESSION: CHF, with mild cardiomegaly and moderate to severe pulmonary edema. Electronically Signed   By: Hulan Saas M.D.   On: 02/27/2017 13:19   Leland Her, DO 03/01/2017, 11:17 AM PGY-2, [facility] FPTS Intern pager: [phone], text pages welcome

## 2017-03-01 NOTE — H&P (View-Only) (Signed)
Subjective:  Patient denies any chest pain states breathing has improved. Patient had episodes of A. fib with RVR last night converted back to sinus rhythm. Review 2-D echo which showed severe mitral regurgitation with myxomatous changes in mitral valve. LV end-systolic dimension is close to 4 cm and EF is below 60%.  Objective:  Vital Signs in the last 24 hours: Temp:  [97.7 F (36.5 C)-98.6 F (37 C)] 98.3 F (36.8 C) (12/23 0452) Pulse Rate:  [72-107] 72 (12/23 0930) Resp:  [17-18] 18 (12/23 0452) BP: (88-117)/(57-90) 104/76 (12/23 0930) SpO2:  [95 %-100 %] 95 % (12/23 0452) Weight:  [68.2 kg (150 lb 6.4 oz)] 68.2 kg (150 lb 6.4 oz) (12/23 0452)  Intake/Output from previous day: 12/22 0701 - 12/23 0700 In: 240 [P.O.:240] Out: 2725 [Urine:2725] Intake/Output from this shift: No intake/output data recorded.  Physical Exam: Neck: no adenopathy, no carotid bruit, no JVD and supple, symmetrical, trachea midline Lungs: clear to auscultation bilaterally Heart: regular rate and rhythm, S1, S2 normal and 2/6 systolic murmur noted Abdomen: soft, non-tender; bowel sounds normal; no masses,  no organomegaly Extremities: extremities normal, atraumatic, no cyanosis or edema  Lab Results: Recent Labs    02/28/17 0633 03/01/17 0506  WBC 12.8* 10.1  HGB 12.1 12.4  PLT 154 151   Recent Labs    02/28/17 0633 03/01/17 0506  NA 138 137  K 3.7 3.4*  CL 101 101  CO2 27 28  GLUCOSE 99 106*  BUN 13 19  CREATININE 0.86 1.02*   Recent Labs    03/01/17 0506  TROPONINI 0.04*   Hepatic Function Panel Recent Labs    03/01/17 0506  PROT 6.1*  ALBUMIN 3.3*  AST 47*  ALT 67*  ALKPHOS 65  BILITOT 0.8   Recent Labs    02/28/17 0633  CHOL 136   No results for input(s): PROTIME in the last 72 hours.  Imaging: Imaging results have been reviewed and Ct Angio Chest Pe W And/or Wo Contrast  Result Date: 02/27/2017 CLINICAL DATA:  Strain shortness of breath since last night.  Found unresponsive and cyanotic. Smoker. EXAM: CT ANGIOGRAPHY CHEST WITH CONTRAST TECHNIQUE: Multidetector CT imaging of the chest was performed using the standard protocol during bolus administration of intravenous contrast. Multiplanar CT image reconstructions and MIPs were obtained to evaluate the vascular anatomy. CONTRAST:  ISOVUE-370 IOPAMIDOL (ISOVUE-370) INJECTION 76% COMPARISON:  Chest radiographs obtained earlier today. FINDINGS: Cardiovascular: Normally opacified pulmonary arteries with no pulmonary arterial filling defects. Mildly enlarged heart. Atheromatous calcifications, including the aorta and coronary arteries. Mediastinum/Nodes: No enlarged mediastinal, hilar, or axillary lymph nodes. Thyroid gland, trachea, and esophagus demonstrate no significant findings. Lungs/Pleura: Small bilateral pleural effusions. Patchy interstitial prominence in both lungs. Diffusely prominent pulmonary vasculature. Bilateral dependent lower lobe atelectasis. Upper Abdomen: Atheromatous arterial calcifications. Musculoskeletal: Thoracic spine degenerative changes. Review of the MIP images confirms the above findings. IMPRESSION: 1. Changes of acute congestive heart failure with mild cardiomegaly and bilateral pleural effusions. 2. No pulmonary emboli. 3. Bilateral lower lobe atelectasis. 4. Atheromatous calcifications, including the coronary arteries and aorta. Aortic Atherosclerosis (ICD10-I70.0). Electronically Signed   By: Beckie Salts M.D.   On: 02/27/2017 16:37   Dg Chest Portable 1 View  Result Date: 02/27/2017 CLINICAL DATA:  61 year old with acute onset of severe shortness of breath and cyanosis. Current smoker. Patient currently on BiPAP. EXAM: PORTABLE CHEST 1 VIEW COMPARISON:  None. FINDINGS: Cardiac silhouette mildly enlarged. Thoracic aorta atherosclerotic. Moderate to severe diffuse interstitial pulmonary edema  with associated mild airspace pulmonary edema. No visible pleural effusions.  IMPRESSION: CHF, with mild cardiomegaly and moderate to severe pulmonary edema. Electronically Signed   By: Hulan Saashomas  Lawrence M.D.   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 fib with RVR Hypothyroidism Depression Tobacco abuse Plan Discussed with patient regarding 2-D echo finding and left and right cardiac catheterization and possibly need for MVR and Maze procedure its risk and benefits i.e. death MI stroke need for emergency CABG local vascular complications and consents for the procedure. Will get CVTS consult in a.m.  LOS: 2 days    Rinaldo CloudHarwani, Kelisha Dall 03/01/2017, 11:04 AM

## 2017-03-01 NOTE — Progress Notes (Signed)
ANTICOAGULATION CONSULT NOTE  Pharmacy Consult for Heparin Indication: SVT  No Known Allergies  Patient Measurements: Height: 5\' 5"  (165.1 cm) Weight: 150 lb 6.4 oz (68.2 kg) IBW/kg (Calculated) : 57  Vital Signs: Temp: 98.3 F (36.8 C) (12/23 0452) Temp Source: Oral (12/23 0452) BP: 104/76 (12/23 0930) Pulse Rate: 72 (12/23 0930)  Labs: Recent Labs    02/27/17 1300 02/27/17 1314 02/28/17 0633 03/01/17 0506 03/01/17 0849  HGB 13.5 14.6 12.1 12.4  --   HCT 41.9 43.0 35.6* 37.7  --   PLT 192  --  154 151  --   LABPROT 15.2  --   --   --   --   INR 1.21  --   --   --   --   HEPARINUNFRC  --   --   --   --  0.49  CREATININE 1.19* 1.00 0.86 1.02*  --   TROPONINI  --   --   --  0.04*  --     Estimated Creatinine Clearance: 53.4 mL/min (A) (by C-G formula based on SCr of 1.02 mg/dL (H)).   Medical History: Past Medical History:  Diagnosis Date  . Acute exacerbation of CHF (congestive heart failure) (HCC) 02/27/2017  . Arthritis   . Hypertension     Medications:  Medications Prior to Admission  Medication Sig Dispense Refill Last Dose  . acetaminophen (TYLENOL) 325 MG tablet Take 650 mg by mouth every 6 (six) hours as needed for headache (pain).   few weeks ago  . Aspirin-Salicylamide-Caffeine (BC HEADACHE POWDER PO) Take 1 packet by mouth 2 (two) times daily as needed (pain/headache).   02/26/2017 at Unknown time  . escitalopram (LEXAPRO) 20 MG tablet Take 20 mg by mouth at bedtime.  0 02/26/2017 at pm  . Ipratropium Bromide (ATROVENT IN) Inhale 2 puffs into the lungs 2 (two) times daily as needed (shortness of breath/wheezing).   02/27/2017 at am  . levothyroxine (SYNTHROID, LEVOTHROID) 100 MCG tablet Take 100 mcg by mouth daily before breakfast.  1 02/27/2017 at am  . Multiple Vitamin (MULTIVITAMIN WITH MINERALS) TABS tablet Take 1 tablet by mouth daily.   02/26/2017 at am  . OVER THE COUNTER MEDICATION Take 1 tablet by mouth daily. Hildred Priest dietary supplement    02/27/2017 at am    Assessment: 61 y.o. female presenting with tachycardia, SOB. Pharmacy consulted to dose heparin. Not on anticoagulation PTA. CTA negative for PE. Heparin level therapeutic. CBC wnl, troponin 0.01>0.04. No bleed documented.  Goal of Therapy:  Heparin level 0.3-0.7 units/ml Monitor platelets by anticoagulation protocol: Yes   Plan:  Continue heparin at 1000 units/hr Daily heparin level/CBC Monitor for s/sx bleeding F/u Cardiology plans   Babs Bertin, PharmD, BCPS Clinical Pharmacist Clinical phone for 03/01/2017 until 3:30pm: [phone] If after 3:30pm, please call main pharmacy at: [phone] 03/01/2017 10:58 AM

## 2017-03-02 ENCOUNTER — Encounter (HOSPITAL_COMMUNITY)
Admission: EM | Disposition: A | Discharge status: Home Health | Payer: Self-pay | Source: Home / Self Care | Attending: Cardiothoracic Surgery

## 2017-03-02 DIAGNOSIS — I25110 Atherosclerotic heart disease of native coronary artery with unstable angina pectoris: Secondary | ICD-10-CM

## 2017-03-02 DIAGNOSIS — I340 Nonrheumatic mitral (valve) insufficiency: Secondary | ICD-10-CM | POA: Diagnosis present

## 2017-03-02 HISTORY — PX: RIGHT/LEFT HEART CATH AND CORONARY ANGIOGRAPHY: CATH118266

## 2017-03-02 LAB — POCT I-STAT 3, VENOUS BLOOD GAS (G3P V)
ACID-BASE DEFICIT: 1 mmol/L (ref 0.0–2.0)
BICARBONATE: 24.2 mmol/L (ref 20.0–28.0)
BICARBONATE: 24.9 mmol/L (ref 20.0–28.0)
Bicarbonate: 24.8 mmol/L (ref 20.0–28.0)
Bicarbonate: 25.9 mmol/L (ref 20.0–28.0)
Bicarbonate: 25.5 mmol/L (ref 20.0–28.0)
O2 SAT: 41 %
O2 SAT: 55 %
O2 SAT: 63 %
O2 SAT: 57 %
O2 Saturation: 60 %
PCO2 VEN: 41.3 mmHg — AB (ref 44.0–60.0)
PCO2 VEN: 43.9 mmHg — AB (ref 44.0–60.0)
PH VEN: 7.384 (ref 7.250–7.430)
PH VEN: 7.387 (ref 7.250–7.430)
PO2 VEN: 24 mmHg — AB (ref 32.0–45.0)
PO2 VEN: 30 mmHg — AB (ref 32.0–45.0)
PO2 VEN: 31 mmHg — AB (ref 32.0–45.0)
PO2 VEN: 33 mmHg (ref 32.0–45.0)
TCO2: 25 mmol/L (ref 22–32)
TCO2: 26 mmol/L (ref 22–32)
TCO2: 26 mmol/L (ref 22–32)
TCO2: 27 mmol/L (ref 22–32)
TCO2: 27 mmol/L (ref 22–32)
pCO2, Ven: 40.6 mmHg — ABNORMAL LOW (ref 44.0–60.0)
pCO2, Ven: 41.2 mmHg — ABNORMAL LOW (ref 44.0–60.0)
pCO2, Ven: 42.9 mmHg — ABNORMAL LOW (ref 44.0–60.0)
pH, Ven: 7.378 (ref 7.250–7.430)
pH, Ven: 7.39 (ref 7.250–7.430)
pH, Ven: 7.383 (ref 7.250–7.430)
pO2, Ven: 31 mmHg — CL (ref 32.0–45.0)

## 2017-03-02 LAB — POCT I-STAT 3, ART BLOOD GAS (G3+)
ACID-BASE DEFICIT: 1 mmol/L (ref 0.0–2.0)
Bicarbonate: 24.3 mmol/L (ref 20.0–28.0)
O2 SAT: 96 %
TCO2: 26 mmol/L (ref 22–32)
pCO2 arterial: 41 mmHg (ref 32.0–48.0)
pH, Arterial: 7.381 (ref 7.350–7.450)
pO2, Arterial: 80 mmHg — ABNORMAL LOW (ref 83.0–108.0)

## 2017-03-02 LAB — COMPREHENSIVE METABOLIC PANEL
ALBUMIN: 3.1 g/dL — AB (ref 3.5–5.0)
ALT: 51 U/L (ref 14–54)
AST: 32 U/L (ref 15–41)
Alkaline Phosphatase: 59 U/L (ref 38–126)
Anion gap: 8 (ref 5–15)
BILIRUBIN TOTAL: 0.7 mg/dL (ref 0.3–1.2)
BUN: 16 mg/dL (ref 6–20)
CO2: 27 mmol/L (ref 22–32)
CREATININE: 0.97 mg/dL (ref 0.44–1.00)
Calcium: 8.7 mg/dL — ABNORMAL LOW (ref 8.9–10.3)
Chloride: 103 mmol/L (ref 101–111)
GFR calc Af Amer: >60 mL/min (ref >60)
GFR calc non Af Amer: >60 mL/min (ref >60)
GLUCOSE: 95 mg/dL (ref 65–99)
POTASSIUM: 4.1 mmol/L (ref 3.5–5.1)
Sodium: 138 mmol/L (ref 135–145)
TOTAL PROTEIN: 5.8 g/dL — AB (ref 6.5–8.1)

## 2017-03-02 LAB — CBC
HEMATOCRIT: 37.8 % (ref 36.0–46.0)
HEMOGLOBIN: 12.4 g/dL (ref 12.0–15.0)
MCH: 32 pg (ref 26.0–34.0)
MCHC: 32.8 g/dL (ref 30.0–36.0)
MCV: 97.7 fL (ref 78.0–100.0)
Platelets: 132 10*3/uL — ABNORMAL LOW (ref 150–400)
RBC: 3.87 MIL/uL (ref 3.87–5.11)
RDW: 13.9 % (ref 11.5–15.5)
WBC: 5.8 10*3/uL (ref 4.0–10.5)

## 2017-03-02 LAB — HEPARIN LEVEL (UNFRACTIONATED): HEPARIN UNFRACTIONATED: 0.18 [IU]/mL — AB (ref 0.30–0.70)

## 2017-03-02 LAB — GLUCOSE, CAPILLARY: GLUCOSE-CAPILLARY: 93 mg/dL (ref 65–99)

## 2017-03-02 LAB — POCT ACTIVATED CLOTTING TIME: ACTIVATED CLOTTING TIME: 136 s

## 2017-03-02 SURGERY — RIGHT/LEFT HEART CATH AND CORONARY ANGIOGRAPHY
Anesthesia: LOCAL

## 2017-03-02 MED ORDER — LIDOCAINE HCL (PF) 1 % IJ SOLN
INTRAMUSCULAR | Status: AC
  Filled 2017-03-02: qty 30

## 2017-03-02 MED ORDER — SODIUM CHLORIDE 0.9% FLUSH: 3.0000 mL | INTRAVENOUS | Status: DC | PRN

## 2017-03-02 MED ORDER — FUROSEMIDE 40 MG PO TABS
40.0000 mg | ORAL_TABLET | Freq: Every day | ORAL | Status: DC
  Administered 2017-03-03: 40 mg via ORAL
  Filled 2017-03-02: qty 1

## 2017-03-02 MED ORDER — HEPARIN (PORCINE) IN NACL 2-0.9 UNIT/ML-% IJ SOLN
INTRAMUSCULAR | Status: AC | PRN
  Administered 2017-03-02: 1000 mL

## 2017-03-02 MED ORDER — IOPAMIDOL (ISOVUE-370) INJECTION 76%
INTRAVENOUS | Status: AC
  Filled 2017-03-02: qty 50

## 2017-03-02 MED ORDER — MOMETASONE FURO-FORMOTEROL FUM 200-5 MCG/ACT IN AERO: 2.0000 | INHALATION_SPRAY | Freq: Two times a day (BID) | RESPIRATORY_TRACT | Status: DC

## 2017-03-02 MED ORDER — SODIUM CHLORIDE 0.9 % IV SOLN
INTRAVENOUS | Status: DC | PRN
  Administered 2017-03-02: 10 mL/h via INTRAVENOUS

## 2017-03-02 MED ORDER — HEPARIN SODIUM (PORCINE) 5000 UNIT/ML IJ SOLN
5000.0000 [IU] | Freq: Three times a day (TID) | INTRAMUSCULAR | Status: DC
  Administered 2017-03-03 (×3): 5000 [IU] via SUBCUTANEOUS
  Filled 2017-03-02 (×5): qty 1

## 2017-03-02 MED ORDER — FENTANYL CITRATE (PF) 100 MCG/2ML IJ SOLN
INTRAMUSCULAR | Status: DC | PRN
  Administered 2017-03-02 (×2): 25 ug via INTRAVENOUS

## 2017-03-02 MED ORDER — IOPAMIDOL (ISOVUE-370) INJECTION 76%
INTRAVENOUS | Status: DC | PRN
  Administered 2017-03-02: 95 mL via INTRA_ARTERIAL

## 2017-03-02 MED ORDER — ONDANSETRON HCL 4 MG/2ML IJ SOLN: 4.0000 mg | Freq: Four times a day (QID) | INTRAMUSCULAR | Status: DC | PRN

## 2017-03-02 MED ORDER — LIDOCAINE HCL (PF) 1 % IJ SOLN
INTRAMUSCULAR | Status: DC | PRN
  Administered 2017-03-02: 25 mL

## 2017-03-02 MED ORDER — HEPARIN (PORCINE) IN NACL 2-0.9 UNIT/ML-% IJ SOLN
INTRAMUSCULAR | Status: AC
  Filled 2017-03-02: qty 1000

## 2017-03-02 MED ORDER — FENTANYL CITRATE (PF) 100 MCG/2ML IJ SOLN
INTRAMUSCULAR | Status: AC
  Filled 2017-03-02: qty 2

## 2017-03-02 MED ORDER — SODIUM CHLORIDE 0.9% FLUSH
3.0000 mL | Freq: Two times a day (BID) | INTRAVENOUS | Status: DC
  Administered 2017-03-02: 3 mL via INTRAVENOUS

## 2017-03-02 MED ORDER — GUAIFENESIN ER 600 MG PO TB12
600.0000 mg | ORAL_TABLET | Freq: Two times a day (BID) | ORAL | Status: DC
  Administered 2017-03-02 – 2017-03-03 (×3): 600 mg via ORAL
  Filled 2017-03-02 (×3): qty 1

## 2017-03-02 MED ORDER — MIDAZOLAM HCL 2 MG/2ML IJ SOLN
INTRAMUSCULAR | Status: AC
  Filled 2017-03-02: qty 2

## 2017-03-02 MED ORDER — SODIUM CHLORIDE 0.9 % IV SOLN: INTRAVENOUS | Status: AC

## 2017-03-02 MED ORDER — MIDAZOLAM HCL 2 MG/2ML IJ SOLN
INTRAMUSCULAR | Status: DC | PRN
  Administered 2017-03-02 (×2): 1 mg via INTRAVENOUS

## 2017-03-02 MED ORDER — SODIUM CHLORIDE 0.9 % IV SOLN: 250.0000 mL | INTRAVENOUS | Status: DC | PRN

## 2017-03-02 MED ORDER — HEPARIN BOLUS VIA INFUSION
2000.0000 [IU] | Freq: Once | INTRAVENOUS | Status: AC
  Administered 2017-03-02: 2000 [IU] via INTRAVENOUS
  Filled 2017-03-02: qty 2000

## 2017-03-02 SURGICAL SUPPLY — 11 items
CATH INFINITI 5FR MULTPACK ANG (CATHETERS) ×2 IMPLANT
CATH SWAN GANZ 7F STRAIGHT (CATHETERS) ×2 IMPLANT
KIT HEART LEFT (KITS) ×2 IMPLANT
PACK CARDIAC CATHETERIZATION (CUSTOM PROCEDURE TRAY) ×2 IMPLANT
SHEATH PINNACLE 5F 10CM (SHEATH) ×2 IMPLANT
SHEATH PINNACLE 7F 10CM (SHEATH) ×2 IMPLANT
SYR MEDRAD MARK V 150ML (SYRINGE) ×2 IMPLANT
TRANSDUCER W/STOPCOCK (MISCELLANEOUS) IMPLANT
TUBING ART PRESS 72  MALE/FEM (TUBING) ×1
TUBING ART PRESS 72 MALE/FEM (TUBING) ×1 IMPLANT
WIRE EMERALD 3MM-J .035X150CM (WIRE) ×2 IMPLANT

## 2017-03-02 NOTE — Progress Notes (Signed)
Family Medicine Teaching Service Daily Progress Note Intern Pager: [phone]  Patient name: Diane Nielsen Medical record number: [id number] Date of birth: [birth year] Age: 61 y.o. Gender: female  Primary Care Provider: Patient, No Pcp Per Consultants: None Code Status: Full  Chief Complaint: Shortness of breath  Assessment and Plan: Diane Nielsen is a 61 y.o. female presenting with shortness of breath found to have acute heart failure. PMH is significant for hypothyroidism, depression, arthritis, and tobacco abuse.  Acute decompensated heart failure with pulmonary edema: 2/2 to symptomatic severe mitral regurgitation. Echo EF 55-60% with L atrium massively dilated. Now on room air. UOP net -3.7L but up 0.6L in last 24h, still appears euvolemic. Weight down 0.6kg this hospital stay. Unknown dry weight.  Earlier this hospital stay had episode of SVT w/HR 150s requiring amiodarone gtt due to soft BPs. -Monitor on telemetry -s/p IV lasix 20 mg x2. Holding on further diuresis since appears euvolemic on exam -Cardiology following, appreciate recommendations. On amiodarone gtt. Will need cath and possible MVR and maze procedure.  - CVTS consult this am, appreciate recommendations -Strict I/O's, daily weights -Monitor respiratory status -Risk strat labs: A1c 6.0, Cholesterol 136, LDL 85 -heparin gtt for SVT  Hypothyroidism: Chronic. TSH 12/22 WNL at 2.548.  -Continue home levothyroxine 100 mcg  Depression: Chronic. Spouse died 6 months ago, patient in grief counseling and reports coping well -Continue home Lexapro 20 mg qhs  Tobacco abuse: Smokes a little less than half a pack a day.   -continue nicotine patch 14 mg qd   Elevated LFTs, resolved: AST 93>32, ALT 82>51. In setting of acute heart failure may be 2/2 volume overload.  - trend CMP  FEN/GI: NPO in case of procedure Prophylaxis: heparin  Disposition: pending cardiology/CVTS recs  Subjective:  No acute overnight  events. Feeling well this morning, no concerns. No chest pain or palpitations or SOB. Had a good visit with her daughter yesterday and now feels better about her next steps.   Objective: Temp:  [97.7 F (36.5 C)-98.4 F (36.9 C)] 97.7 F (36.5 C) (12/24 0641) Pulse Rate:  [70-80] 70 (12/24 0641) Resp:  [18] 18 (12/24 0641) BP: (103-111)/(67-78) 111/78 (12/24 0641) SpO2:  [98 %-100 %] 98 % (12/24 0641) Weight:  [68.7 kg (151 lb 8 oz)] 68.7 kg (151 lb 8 oz) (12/24 29560641) Physical Exam:  General: NAD, pleasant, laying in bed comfortably Cardiovascular: regular rhythm, S1 and S2, 2/6 systolic murmur. no LE edema Respiratory: normal WOB on room air. CTAB Gastrointestinal: soft, nontender, nondistended, normoactive BS Neuro: CN II-XII grossly intact, no focal deficits Psych: appropriate affect  Laboratory: Recent Labs  Lab 02/28/17 0633 03/01/17 0506 03/02/17 0550  WBC 12.8* 10.1 5.8  HGB 12.1 12.4 12.4  HCT 35.6* 37.7 37.8  PLT 154 151 132*   Recent Labs  Lab 02/28/17 0633 03/01/17 0506 03/02/17 0550  NA 138 137 138  K 3.7 3.4* 4.1  CL 101 101 103  CO2 27 28 27   BUN 13 19 16   CREATININE 0.86 1.02* 0.97  CALCIUM 8.5* 8.4* 8.7*  PROT 6.3* 6.1* 5.8*  BILITOT 0.9 0.8 0.7  ALKPHOS 69 65 59  ALT 77* 67* 51  AST 66* 47* 32  GLUCOSE 99 106* 95   Troponin: 0.04 > 0.03  Imaging/Diagnostic Tests:  Transthoracic Echocardiography  Study Date: 02/28/2017 Conclusions - Left ventricle: The cavity size was mildly dilated. Wall   thickness was normal. Systolic function was normal. The estimated   ejection fraction was in  the range of 55% to 60%. - Mitral valve: MV apparatus is moderately thickened. Posterior   leaflet appears tethered There is also some restricted motion of   anterior leaflet. There is poor coaptation of leaflets in systole   with resultant severe MR - Left atrium: The atrium was massively dilated. - Right atrium: The atrium was mildly dilated. - Tricuspid  valve: There was moderate regurgitation.  Ct Angio Chest Pe W And/or Wo Contrast IMPRESSION: 1. Changes of acute congestive heart failure with mild cardiomegaly and bilateral pleural effusions. 2. No pulmonary emboli. 3. Bilateral lower lobe atelectasis. 4. Atheromatous calcifications, including the coronary arteries and aorta. Aortic Atherosclerosis (ICD10-I70.0). Electronically Signed   By: Beckie Salts M.D.   On: 02/27/2017 16:37   Dg Chest Portable 1 View   Result Date: 02/27/2017 IMPRESSION: CHF, with mild cardiomegaly and moderate to severe pulmonary edema. Electronically Signed   By: Hulan Saas M.D.   On: 02/27/2017 13:19   Leland Her, DO 03/02/2017, 8:19 AM PGY-2, [facility] FPTS Intern pager: [phone], text pages welcome

## 2017-03-02 NOTE — Progress Notes (Signed)
Day of Surgery Procedure(s) (LRB): RIGHT/LEFT HEART CATH AND CORONARY ANGIOGRAPHY (N/A) Subjective: Patient examined and recent echocardiogram and catheterization-coronary angiogram images personally reviewed and counseled with patient  61 year old Caucasian female who lives alone and works at [employer] admitted 3 days ago with acute shortness of breath secondary to moderate-severe mitral regurgitation and rapid atrial fibrillation of new onset. Echocardiogram shows central MR, mild-moderate TR, normal LV systolic function. She has  been medically treated with amiodarone, heparin, diuretics, and has had improvement in symptoms. Today she underwent right and left heart catheterization. This showed moderate pulmonary hypertension PA pressure 45/25 with mixed venous saturation 62%, CVP 10.  The patient is a heavy smoker. She smoked up until her admission. She denies a previous recent admissions. There is no history of heart valve disease in the family. 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  Objective: Vital signs in last 24 hours: Temp:  [97.7 F (36.5 C)-98.4 F (36.9 C)] 98.1 F (36.7 C) (12/24 1157) Pulse Rate:  [66-95] 71 (12/24 1540) Cardiac Rhythm: Normal sinus rhythm (12/24 0700) Resp:  [12-20] 19 (12/24 1540) BP: (103-129)/(67-91) 126/87 (12/24 1540) SpO2:  [94 %-99 %] 97 % (12/24 1540) Weight:  [151 lb 8 oz (68.7 kg)] 151 lb 8 oz (68.7 kg) (12/24 0641)  Hemodynamic parameters for last 24 hours:    Intake/Output from previous day: 12/23 0701 - 12/24 0700 In: 1143.1 [P.O.:120; I.V.:1023.1] Out: 1250 [Urine:1250] Intake/Output this shift: Total I/O In: 91.4 [I.V.:91.4] Out: 200 [Urine:200]  Patient resting comfortably and cath lab holding area HEENT normocephalic dentition appears adequate Neck with mild JVD no bruit or adenopathy Coarse breath sounds bilaterally Heart rate rhythm currently sinus, 3/6 holosystolic murmur to left  axilla Abdomen soft nontender mildly obese Extremities warm mild ankle edema Peripheral pulses intact Neuro intact without focal deficit  Lab Results: Recent Labs    03/01/17 0506 03/02/17 0550  WBC 10.1 5.8  HGB 12.4 12.4  HCT 37.7 37.8  PLT 151 132*   BMET:  Recent Labs    03/01/17 0506 03/02/17 0550  NA 137 138  K 3.4* 4.1  CL 101 103  CO2 28 27  GLUCOSE 106* 95  BUN 19 16  CREATININE 1.02* 0.97  CALCIUM 8.4* 8.7*    PT/INR: No results for input(s): LABPROT, INR in the last 72 hours. ABG    Component Value Date/Time   PHART 7.381 03/02/2017 1528   HCO3 25.5 03/02/2017 1530   TCO2 27 03/02/2017 1530   ACIDBASEDEF 1.0 03/02/2017 1528   O2SAT 57.0 03/02/2017 1530   CBG (last 3)  Recent Labs    03/02/17 1614  GLUCAP 93    Assessment/Plan: S/P Procedure(s) (LRB): RIGHT/LEFT HEART CATH AND CORONARY ANGIOGRAPHY (N/A) Moderate to severe MR New onset rapid atrial fibrillation Preserved LV systolic function No significant coronary artery disease COPD with active smoking  Patient would benefit from mitral valve repair-replacement with combined maze procedure. Her pulmonary status needs to be optimized prior to surgery which we hope to schedule later in the week. Orthopantogram pending.   LOS: 3 days    Kathlee Nations Trigt III 03/02/2017

## 2017-03-02 NOTE — Progress Notes (Signed)
ANTICOAGULATION CONSULT NOTE  Pharmacy Consult for Heparin Indication: afib  No Known Allergies  Patient Measurements: Height: 5\' 5"  (165.1 cm) Weight: 151 lb 8 oz (68.7 kg) IBW/kg (Calculated) : 57  Vital Signs: Temp: 97.7 F (36.5 C) (12/24 0641) Temp Source: Oral (12/24 0641) BP: 111/78 (12/24 0641) Pulse Rate: 70 (12/24 0641)  Labs: Recent Labs    02/27/17 1300  02/28/17 0633 03/01/17 0506 03/01/17 0849 03/01/17 1107 03/02/17 0550  HGB 13.5   < > 12.1 12.4  --   --  12.4  HCT 41.9   < > 35.6* 37.7  --   --  37.8  PLT 192  --  154 151  --   --  132*  LABPROT 15.2  --   --   --   --   --   --   INR 1.21  --   --   --   --   --   --   HEPARINUNFRC  --   --   --   --  0.49  --  0.18*  CREATININE 1.19*   < > 0.86 1.02*  --   --  0.97  TROPONINI  --   --   --  0.04*  --  0.03*  --    < > = values in this interval not displayed.    Estimated Creatinine Clearance: 60.8 mL/min (by C-G formula based on SCr of 0.97 mg/dL).   Medical History: Past Medical History:  Diagnosis Date  . Acute exacerbation of CHF (congestive heart failure) (HCC) 02/27/2017  . Arthritis   . Hypertension     Medications:  Medications Prior to Admission  Medication Sig Dispense Refill Last Dose  . acetaminophen (TYLENOL) 325 MG tablet Take 650 mg by mouth every 6 (six) hours as needed for headache (pain).   few weeks ago  . Aspirin-Salicylamide-Caffeine (BC HEADACHE POWDER PO) Take 1 packet by mouth 2 (two) times daily as needed (pain/headache).   02/26/2017 at Unknown time  . escitalopram (LEXAPRO) 20 MG tablet Take 20 mg by mouth at bedtime.  0 02/26/2017 at pm  . Ipratropium Bromide (ATROVENT IN) Inhale 2 puffs into the lungs 2 (two) times daily as needed (shortness of breath/wheezing).   02/27/2017 at am  . levothyroxine (SYNTHROID, LEVOTHROID) 100 MCG tablet Take 100 mcg by mouth daily before breakfast.  1 02/27/2017 at am  . Multiple Vitamin (MULTIVITAMIN WITH MINERALS) TABS tablet  Take 1 tablet by mouth daily.   02/26/2017 at am  . OVER THE COUNTER MEDICATION Take 1 tablet by mouth daily. Hildred Priest dietary supplement   02/27/2017 at am    Assessment: 61 y.o. female presenting with tachycardia, SOB. Pharmacy consulted to dose heparin. Not on anticoagulation PTA. CTA negative for PE. Echo noted with severe MR and plans for cath today -heparin level is below goal  Goal of Therapy:  Heparin level 0.3-0.7 units/ml Monitor platelets by anticoagulation protocol: Yes   Plan:  Heparin bolus 2000 units then increase to 1200 units/hr Will follow plans post cath Daily heparin level and CBC  Harland German, Pharm D 03/02/2017 7:57 AM

## 2017-03-02 NOTE — Interval H&P Note (Signed)
Cath Lab Visit (complete for each Cath Lab visit)  Clinical Evaluation Leading to the Procedure:   ACS: No.  Non-ACS:    Anginal Classification: CCS IV  Anti-ischemic medical therapy: Minimal Therapy (1 class of medications)  Non-Invasive Test Results: No non-invasive testing performed  Prior CABG: No previous CABG      History and Physical Interval Note:  03/02/2017 2:35 PM  Diane Nielsen  has presented today for surgery, with the diagnosis of unstable angina  The various methods of treatment have been discussed with the patient and family. After consideration of risks, benefits and other options for treatment, the patient has consented to  Procedure(s): RIGHT/LEFT HEART CATH AND CORONARY ANGIOGRAPHY (N/A) as a surgical intervention .  The patient's history has been reviewed, patient examined, no change in status, stable for surgery.  I have reviewed the patient's chart and labs.  Questions were answered to the patient's satisfaction.     Rinaldo Cloud

## 2017-03-02 NOTE — Progress Notes (Addendum)
Site area: RFA / RFV Site Prior to Removal:  Level  Pressure Applied For:20 min Manual: yes   Patient Status During Pull: stable  Post Pull Site:  Level 0 Post Pull Instructions Given:  yes Post Pull Pulses Present: palpablee Dressing Applied:  tegaderm Bedrest begins @ 1630 till 2030 Comments:

## 2017-03-03 ENCOUNTER — Other Ambulatory Visit (HOSPITAL_COMMUNITY): Payer: PRIVATE HEALTH INSURANCE

## 2017-03-03 ENCOUNTER — Inpatient Hospital Stay (HOSPITAL_COMMUNITY): Payer: PRIVATE HEALTH INSURANCE

## 2017-03-03 DIAGNOSIS — I272 Pulmonary hypertension, unspecified: Secondary | ICD-10-CM | POA: Diagnosis present

## 2017-03-03 DIAGNOSIS — I340 Nonrheumatic mitral (valve) insufficiency: Secondary | ICD-10-CM

## 2017-03-03 LAB — CBC
HEMATOCRIT: 37.2 % (ref 36.0–46.0)
Hemoglobin: 12.3 g/dL (ref 12.0–15.0)
MCH: 32 pg (ref 26.0–34.0)
MCHC: 33.1 g/dL (ref 30.0–36.0)
MCV: 96.9 fL (ref 78.0–100.0)
PLATELETS: 135 10*3/uL — AB (ref 150–400)
RBC: 3.84 MIL/uL — ABNORMAL LOW (ref 3.87–5.11)
RDW: 13.6 % (ref 11.5–15.5)
WBC: 6.2 10*3/uL (ref 4.0–10.5)

## 2017-03-03 LAB — BASIC METABOLIC PANEL
Anion gap: 6 (ref 5–15)
BUN: 10 mg/dL (ref 6–20)
CALCIUM: 8.4 mg/dL — AB (ref 8.9–10.3)
CO2: 27 mmol/L (ref 22–32)
CREATININE: 0.88 mg/dL (ref 0.44–1.00)
Chloride: 104 mmol/L (ref 101–111)
Glucose, Bld: 100 mg/dL — ABNORMAL HIGH (ref 65–99)
Potassium: 4.1 mmol/L (ref 3.5–5.1)
SODIUM: 137 mmol/L (ref 135–145)

## 2017-03-03 LAB — SURGICAL PCR SCREEN
MRSA, PCR: NEGATIVE
Staphylococcus aureus: NEGATIVE

## 2017-03-03 LAB — PREPARE RBC (CROSSMATCH)

## 2017-03-03 LAB — ABO/RH: ABO/RH(D): O POS

## 2017-03-03 MED ORDER — TRANEXAMIC ACID (OHS) BOLUS VIA INFUSION
15.0000 mg/kg | INTRAVENOUS | Status: AC
  Administered 2017-03-04: 1036.5 mg via INTRAVENOUS
  Filled 2017-03-03: qty 1037

## 2017-03-03 MED ORDER — DIAZEPAM 5 MG PO TABS
5.0000 mg | ORAL_TABLET | Freq: Once | ORAL | Status: AC
  Administered 2017-03-04: 5 mg via ORAL
  Filled 2017-03-03: qty 1

## 2017-03-03 MED ORDER — CHLORHEXIDINE GLUCONATE 0.12 % MT SOLN
15.0000 mL | Freq: Once | OROMUCOSAL | Status: AC
  Administered 2017-03-04: 15 mL via OROMUCOSAL
  Filled 2017-03-03: qty 15

## 2017-03-03 MED ORDER — PHENYLEPHRINE HCL 10 MG/ML IJ SOLN
30.0000 ug/min | INTRAMUSCULAR | Status: AC
  Administered 2017-03-04: 20 ug/min via INTRAVENOUS
  Filled 2017-03-03: qty 20

## 2017-03-03 MED ORDER — MAGNESIUM SULFATE 50 % IJ SOLN
40.0000 meq | INTRAMUSCULAR | Status: DC
  Filled 2017-03-03: qty 9.85

## 2017-03-03 MED ORDER — DOPAMINE-DEXTROSE 3.2-5 MG/ML-% IV SOLN
0.0000 ug/kg/min | INTRAVENOUS | Status: AC
  Administered 2017-03-04: 2 ug/kg/min via INTRAVENOUS
  Filled 2017-03-03: qty 250

## 2017-03-03 MED ORDER — TRANEXAMIC ACID (OHS) PUMP PRIME SOLUTION
2.0000 mg/kg | INTRAVENOUS | Status: DC
  Filled 2017-03-03: qty 1.38

## 2017-03-03 MED ORDER — DEXMEDETOMIDINE HCL IN NACL 400 MCG/100ML IV SOLN
0.1000 ug/kg/h | INTRAVENOUS | Status: AC
  Administered 2017-03-04: .3 ug/kg/h via INTRAVENOUS
  Filled 2017-03-03: qty 100

## 2017-03-03 MED ORDER — ALPRAZOLAM 0.25 MG PO TABS: 0.2500 mg | ORAL_TABLET | ORAL | Status: DC | PRN

## 2017-03-03 MED ORDER — VANCOMYCIN HCL 10 G IV SOLR
1250.0000 mg | INTRAVENOUS | Status: AC
  Administered 2017-03-04: 1250 mg via INTRAVENOUS
  Filled 2017-03-03: qty 1250

## 2017-03-03 MED ORDER — EPINEPHRINE PF 1 MG/ML IJ SOLN
0.0000 ug/min | INTRAMUSCULAR | Status: DC
  Filled 2017-03-03: qty 4

## 2017-03-03 MED ORDER — SODIUM CHLORIDE 0.9 % IV SOLN
1.5000 mg/kg/h | INTRAVENOUS | Status: AC
  Administered 2017-03-04: 1.5 mg/kg/h via INTRAVENOUS
  Filled 2017-03-03: qty 25

## 2017-03-03 MED ORDER — TEMAZEPAM 15 MG PO CAPS: 15.0000 mg | ORAL_CAPSULE | Freq: Once | ORAL | Status: DC | PRN

## 2017-03-03 MED ORDER — SODIUM CHLORIDE 0.9 % IV SOLN
INTRAVENOUS | Status: DC
  Filled 2017-03-03: qty 30

## 2017-03-03 MED ORDER — DEXTROSE 5 % IV SOLN
1.5000 g | INTRAVENOUS | Status: AC
  Administered 2017-03-04: 1.5 g via INTRAVENOUS
  Filled 2017-03-03: qty 1.5

## 2017-03-03 MED ORDER — NITROGLYCERIN IN D5W 200-5 MCG/ML-% IV SOLN
2.0000 ug/min | INTRAVENOUS | Status: DC
  Filled 2017-03-03: qty 250

## 2017-03-03 MED ORDER — BISACODYL 5 MG PO TBEC
5.0000 mg | DELAYED_RELEASE_TABLET | Freq: Once | ORAL | Status: AC
  Administered 2017-03-03: 5 mg via ORAL
  Filled 2017-03-03: qty 1

## 2017-03-03 MED ORDER — CHLORHEXIDINE GLUCONATE 4 % EX LIQD
60.0000 mL | Freq: Once | CUTANEOUS | Status: AC
  Administered 2017-03-04: 4 via TOPICAL
  Filled 2017-03-03: qty 60

## 2017-03-03 MED ORDER — PLASMA-LYTE 148 IV SOLN
INTRAVENOUS | Status: DC
  Filled 2017-03-03: qty 2.5

## 2017-03-03 MED ORDER — METOPROLOL TARTRATE 12.5 MG HALF TABLET
12.5000 mg | ORAL_TABLET | Freq: Once | ORAL | Status: AC
  Administered 2017-03-04: 12.5 mg via ORAL
  Filled 2017-03-03: qty 1

## 2017-03-03 MED ORDER — SODIUM CHLORIDE 0.9 % IV SOLN
INTRAVENOUS | Status: AC
  Administered 2017-03-04: 1 [IU]/h via INTRAVENOUS
  Filled 2017-03-03: qty 1

## 2017-03-03 MED ORDER — CHLORHEXIDINE GLUCONATE 4 % EX LIQD
60.0000 mL | Freq: Once | CUTANEOUS | Status: AC
  Administered 2017-03-03: 4 via TOPICAL
  Filled 2017-03-03: qty 60

## 2017-03-03 MED ORDER — DEXTROSE 5 % IV SOLN
750.0000 mg | INTRAVENOUS | Status: DC
  Filled 2017-03-03: qty 750

## 2017-03-03 MED ORDER — POTASSIUM CHLORIDE 2 MEQ/ML IV SOLN
80.0000 meq | INTRAVENOUS | Status: DC
  Filled 2017-03-03: qty 40

## 2017-03-03 NOTE — Progress Notes (Signed)
Family Medicine Teaching Service Daily Progress Note Intern Pager: [phone]  Patient name: Diane Nielsen Medical record number: [id number] Date of birth: [birth year] Age: 61 y.o. Gender: female  Primary Care Provider: Patient, No Pcp Per Consultants: cardiology, CVTS Code Status: Full  Chief Complaint: Shortness of breath  Assessment and Plan: LESTER LISING is a 61 y.o. female presenting with shortness of breath found to have acute heart failure. PMH is significant for hypothyroidism, depression, arthritis, and tobacco abuse.  Acute decompensated heart failure with pulmonary edema: 2/2 to symptomatic severe mitral regurgitation. Earlier this hospital stay had episode of SVT w/HR 150s requiring amiodarone gtt due to soft BPs. Had right and left heart cath on 12/24 in preparation for valve surgery. -Monitor on telemetry -Cardiology following, appreciate recommendations. On amiodarone gtt. Will need cath and possible MVR and maze procedure.  - CVTS following for mitral regurg, plan for valve replacement later this week -dg orthopentagram -Strict I/O's, daily weights -heparin gtt for SVT  Hypothyroidism: Chronic, stable. TSH 12/22 WNL at 2.548.  -Continue home levothyroxine 100 mcg  Depression: Chronic. Spouse died 6 months ago, patient in grief counseling and reports coping well -Continue home Lexapro 20 mg qhs  Tobacco abuse: Smokes a little less than half a pack a day.   -continue nicotine patch 14 mg qd  Elevated LFTs, resolved: AST 93>32, ALT 82>51. In setting of acute heart failure may be 2/2 volume overload.  - trend CMP  FEN/GI: heart healthy diet Prophylaxis: heparin drip  Disposition: pending valve replacement  Subjective:  Doing well this morning. Denies SOB, leg edema. Awaiting valve replacement.  Objective: Temp:  [97.7 F (36.5 C)-98.2 F (36.8 C)] 98.1 F (36.7 C) (12/25 0413) Pulse Rate:  [63-95] 75 (12/25 0413) Resp:  [11-21] 18 (12/25  0413) BP: (106-129)/(71-91) 117/76 (12/25 0413) SpO2:  [94 %-99 %] 98 % (12/25 0413) Weight:  [151 lb 8 oz (68.7 kg)-152 lb 4.8 oz (69.1 kg)] 152 lb 4.8 oz (69.1 kg) (12/25 0413) Physical Exam:  General: NAD, pleasant, laying in bed comfortably Cardiovascular: regular rhythm, S1 and S2, 2/6 systolic murmur. no LE edema Respiratory: normal WOB on room air. CTAB Gastrointestinal: soft, nontender, nondistended, normoactive BS Extremities: no edema or cyanosis Neuro: no focal deficits  Laboratory: Recent Labs  Lab 02/28/17 0633 03/01/17 0506 03/02/17 0550  WBC 12.8* 10.1 5.8  HGB 12.1 12.4 12.4  HCT 35.6* 37.7 37.8  PLT 154 151 132*   Recent Labs  Lab 02/28/17 0633 03/01/17 0506 03/02/17 0550  NA 138 137 138  K 3.7 3.4* 4.1  CL 101 101 103  CO2 27 28 27   BUN 13 19 16   CREATININE 0.86 1.02* 0.97  CALCIUM 8.5* 8.4* 8.7*  PROT 6.3* 6.1* 5.8*  BILITOT 0.9 0.8 0.7  ALKPHOS 69 65 59  ALT 77* 67* 51  AST 66* 47* 32  GLUCOSE 99 106* 95   Troponin: 0.04 > 0.03  Imaging/Diagnostic Tests:  Transthoracic Echocardiography  Study Date: 02/28/2017 Conclusions - Left ventricle: The cavity size was mildly dilated. Wall   thickness was normal. Systolic function was normal. The estimated   ejection fraction was in the range of 55% to 60%. - Mitral valve: MV apparatus is moderately thickened. Posterior   leaflet appears tethered There is also some restricted motion of   anterior leaflet. There is poor coaptation of leaflets in systole   with resultant severe MR - Left atrium: The atrium was massively dilated. - Right atrium: The atrium was mildly  dilated. - Tricuspid valve: There was moderate regurgitation.  Ct Angio Chest Pe W And/or Wo Contrast IMPRESSION: 1. Changes of acute congestive heart failure with mild cardiomegaly and bilateral pleural effusions. 2. No pulmonary emboli. 3. Bilateral lower lobe atelectasis. 4. Atheromatous calcifications, including the coronary  arteries and aorta. Aortic Atherosclerosis (ICD10-I70.0). Electronically Signed   By: Beckie SaltsSteven  Reid M.D.   On: 02/27/2017 16:37   Dg Chest Portable 1 View   Result Date: 02/27/2017 IMPRESSION: CHF, with mild cardiomegaly and moderate to severe pulmonary edema. Electronically Signed   By: Hulan Saashomas  Lawrence M.D.   On: 02/27/2017 13:19   Tillman SersRiccio, Thresa Dozier C, DO 03/03/2017, 6:10 AM PGY-2, [facility] FPTS Intern pager: [phone], text pages welcome

## 2017-03-03 NOTE — Progress Notes (Signed)
Incentive spirometry given to patient and pt educated on how to use.

## 2017-03-03 NOTE — Anesthesia Preprocedure Evaluation (Addendum)
Anesthesia Evaluation  Patient identified by MRN, date of birth, ID band Patient awake    Reviewed: Allergy & Precautions, NPO status , Patient's Chart, lab work & pertinent test results, reviewed documented beta blocker date and time   History of Anesthesia Complications Negative for: history of anesthetic complications  Airway Mallampati: II  TM Distance: >3 FB Neck ROM: Full    Dental no notable dental hx. (+) Caps, Dental Advisory Given   Pulmonary COPD,  COPD inhaler, Current Smoker,    Pulmonary exam normal breath sounds clear to auscultation       Cardiovascular hypertension (no home meds), (-) angina+ CAD (non-obstructive) and +CHF  + dysrhythmias Atrial Fibrillation + Valvular Problems/Murmurs MR  Rhythm:Regular Rate:Normal + Systolic murmurs 97/35/32 ECHO: EF 55-60%, severe MR with posterior leaflet restriction  Heart catheterization showed moderate pulmonary hypertension PA pressure 45/25 with mixed venous saturation 62%, CVP 10.      Neuro/Psych Depression negative neurological ROS     GI/Hepatic negative GI ROS, Neg liver ROS,   Endo/Other  Hypothyroidism   Renal/GU negative Renal ROS     Musculoskeletal   Abdominal   Peds  Hematology plt 135k   Anesthesia Other Findings   Reproductive/Obstetrics                          Anesthesia Physical Anesthesia Plan  ASA: IV  Anesthesia Plan: General   Post-op Pain Management:    Induction: Intravenous  PONV Risk Score and Plan: 3 and Midazolam, Dexamethasone, Ondansetron and Treatment may vary due to age or medical condition  Airway Management Planned: Oral ETT  Additional Equipment: Arterial line, CVP, PA Cath, TEE, Ultrasound Guidance Line Placement and 3D TEE  Intra-op Plan:   Post-operative Plan: Extubation in OR  Informed Consent: I have reviewed the patients History and Physical, chart, labs and discussed the  procedure including the risks, benefits and alternatives for the proposed anesthesia with the patient or authorized representative who has indicated his/her understanding and acceptance.   Dental advisory given  Plan Discussed with: CRNA and Surgeon  Anesthesia Plan Comments: (Plan routine monitors, A line, PA cath, GETA with TEE and post op ventilation)       Anesthesia Quick Evaluation

## 2017-03-03 NOTE — Progress Notes (Signed)
Subjective: Appreciate CVTS consult Doing well denies any chest pain or shortness of breath tolerated left and right heart cath yesterday as per procedure report.  Objective:  Vital Signs in the last 24 hours: Temp:  [98.1 F (36.7 C)-98.2 F (36.8 C)] 98.1 F (36.7 C) (12/25 0413) Pulse Rate:  [63-95] 75 (12/25 0413) Resp:  [11-21] 18 (12/25 0413) BP: (106-129)/(71-91) 117/76 (12/25 0413) SpO2:  [94 %-99 %] 98 % (12/25 0413) Weight:  [69.1 kg (152 lb 4.8 oz)] 69.1 kg (152 lb 4.8 oz) (12/25 0413)  Intake/Output from previous day: 12/24 0701 - 12/25 0700 In: 856.7 [P.O.:120; I.V.:736.7] Out: 600 [Urine:600] Intake/Output from this shift: Total I/O In: 240 [P.O.:240] Out: -   Physical Exam: Neck: no adenopathy, no carotid bruit, no JVD and supple, symmetrical, trachea midline Lungs: clear to auscultation bilaterally Heart: regular rate and rhythm, S1, S2 normal and 2/6 systolic murmur noted Abdomen: soft, non-tender; bowel sounds normal; no masses,  no organomegaly Extremities: extremities normal, atraumatic, no cyanosis or edema and Right groin stable  Lab Results: Recent Labs    03/02/17 0550 03/03/17 0546  WBC 5.8 6.2  HGB 12.4 12.3  PLT 132* 135*   Recent Labs    03/02/17 0550 03/03/17 0546  NA 138 137  K 4.1 4.1  CL 103 104  CO2 27 27  GLUCOSE 95 100*  BUN 16 10  CREATININE 0.97 0.88   Recent Labs    03/01/17 0506 03/01/17 1107  TROPONINI 0.04* 0.03*   Hepatic Function Panel Recent Labs    03/02/17 0550  PROT 5.8*  ALBUMIN 3.1*  AST 32  ALT 51  ALKPHOS 59  BILITOT 0.7   No results for input(s): CHOL in the last 72 hours. No results for input(s): PROTIME in the last 72 hours.  Imaging: Imaging results have been reviewed and Dg Orthopantogram  Result Date: 03/03/2017 CLINICAL DATA:  Preop mitral valve surgery.  Possible jaw infection. EXAM: ORTHOPANTOGRAM/PANORAMIC COMPARISON:  None. FINDINGS: No evidence of fracture or dislocation. No  focal bone destruction to suggest osteomyelitis. 1.3 cm well-defined sclerotic focus over the left mandible. IMPRESSION: No acute findings. 1.3 cm well-defined benign-appearing sclerotic focus over the left mandible. Electronically Signed   By: Elberta Fortis M.D.   On: 03/03/2017 08:01   Dg Chest 2 View  Result Date: 03/03/2017 CLINICAL DATA:  Shortness of Breath EXAM: CHEST  2 VIEW COMPARISON:  02/27/2017 FINDINGS: Improvement in the pulmonary edema pattern from prior study. Mild hyperinflation. Mild cardiomegaly. No confluent opacities or effusions. IMPRESSION: Improvement in the CHF pattern. Cardiomegaly.  Hyperinflation. Electronically Signed   By: Charlett Nose M.D.   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 fib with RVR Hypothyroidism Depression Tobacco abuse Plan Continue present management Restart heparin per pharmacy protocol We will be scheduled for MVR/Maze procedure per CVTS this admission   LOS: 4 days    Rinaldo Cloud 03/03/2017, 10:05 AM

## 2017-03-03 NOTE — Progress Notes (Signed)
Charge Respiratory therapist notified regarding order for PFTs. Per RT, staff is not present to perform test. Asa Lente PA for CVTS notified and states pt is still ok for surgery if test is not performed.

## 2017-03-03 NOTE — Progress Notes (Signed)
1 Day Post-Op Procedure(s) (LRB): RIGHT/LEFT HEART CATH AND CORONARY ANGIOGRAPHY (N/A) Subjective: Comfortable in sinus rhythm Chest x-ray shows improved edema Orthopantogram without evidence of dental infection Carotid Dopplers pending Plan mitral valve repair-replacement for severe MR and acute pulmonary edema with combined maze procedure in a.m. Procedure benefits and risks discussed in detail with patient. Objective: Vital signs in last 24 hours: Temp:  [98.1 F (36.7 C)-98.2 F (36.8 C)] 98.1 F (36.7 C) (12/25 0413) Pulse Rate:  [63-95] 75 (12/25 0413) Cardiac Rhythm: Normal sinus rhythm (12/25 0700) Resp:  [11-21] 18 (12/25 0413) BP: (106-129)/(71-91) 117/76 (12/25 0413) SpO2:  [94 %-99 %] 98 % (12/25 0413) Weight:  [152 lb 4.8 oz (69.1 kg)] 152 lb 4.8 oz (69.1 kg) (12/25 0413)  Hemodynamic parameters for last 24 hours:    Intake/Output from previous day: 12/24 0701 - 12/25 0700 In: 856.7 [P.O.:120; I.V.:736.7] Out: 600 [Urine:600] Intake/Output this shift: Total I/O In: 240 [P.O.:240] Out: -        Exam    General- alert and comfortable   Lungs- clear without rales, wheezes   Cor- regular rate and rhythm, 3/6 holosystolic MR murmur , gallop   Abdomen- soft, non-tender   Extremities - warm, non-tender, minimal edema   Neuro- oriented, appropriate, no focal weakness   Lab Results: Recent Labs    03/02/17 0550 03/03/17 0546  WBC 5.8 6.2  HGB 12.4 12.3  HCT 37.8 37.2  PLT 132* 135*   BMET:  Recent Labs    03/02/17 0550 03/03/17 0546  NA 138 137  K 4.1 4.1  CL 103 104  CO2 27 27  GLUCOSE 95 100*  BUN 16 10  CREATININE 0.97 0.88  CALCIUM 8.7* 8.4*    PT/INR: No results for input(s): LABPROT, INR in the last 72 hours. ABG    Component Value Date/Time   PHART 7.381 03/02/2017 1528   HCO3 25.5 03/02/2017 1530   TCO2 27 03/02/2017 1530   ACIDBASEDEF 1.0 03/02/2017 1528   O2SAT 57.0 03/02/2017 1530   CBG (last 3)  Recent Labs     03/02/17 1614  GLUCAP 93    Assessment/Plan: S/P Procedure(s) (LRB): RIGHT/LEFT HEART CATH AND CORONARY ANGIOGRAPHY (N/A) CABG with mitral valve repair-replacement in a.m.   LOS: 4 days    Diane Nielsen 03/03/2017

## 2017-03-04 ENCOUNTER — Inpatient Hospital Stay (HOSPITAL_COMMUNITY): Payer: PRIVATE HEALTH INSURANCE | Admitting: Anesthesiology

## 2017-03-04 ENCOUNTER — Inpatient Hospital Stay (HOSPITAL_COMMUNITY): Payer: PRIVATE HEALTH INSURANCE

## 2017-03-04 ENCOUNTER — Encounter (HOSPITAL_COMMUNITY)
Admission: EM | Disposition: A | Discharge status: Home Health | Payer: Self-pay | Source: Home / Self Care | Attending: Cardiothoracic Surgery

## 2017-03-04 ENCOUNTER — Encounter (HOSPITAL_COMMUNITY): Payer: Self-pay | Admitting: Cardiology

## 2017-03-04 DIAGNOSIS — Z952 Presence of prosthetic heart valve: Secondary | ICD-10-CM

## 2017-03-04 DIAGNOSIS — I480 Paroxysmal atrial fibrillation: Secondary | ICD-10-CM

## 2017-03-04 HISTORY — PX: MITRAL VALVE REPLACEMENT: SHX147

## 2017-03-04 HISTORY — PX: CLIPPING OF ATRIAL APPENDAGE: SHX5773

## 2017-03-04 HISTORY — PX: MAZE: SHX5063

## 2017-03-04 LAB — POCT I-STAT 4, (NA,K, GLUC, HGB,HCT)
Glucose, Bld: 134 mg/dL — ABNORMAL HIGH (ref 65–99)
HEMATOCRIT: 32 % — AB (ref 36.0–46.0)
Hemoglobin: 10.9 g/dL — ABNORMAL LOW (ref 12.0–15.0)
Potassium: 3.9 mmol/L (ref 3.5–5.1)
SODIUM: 144 mmol/L (ref 135–145)

## 2017-03-04 LAB — CBC
HCT: 39.3 % (ref 36.0–46.0)
HCT: 30.7 % — ABNORMAL LOW (ref 36.0–46.0)
HEMATOCRIT: 31.9 % — AB (ref 36.0–46.0)
Hemoglobin: 12.9 g/dL (ref 12.0–15.0)
Hemoglobin: 10.9 g/dL — ABNORMAL LOW (ref 12.0–15.0)
Hemoglobin: 10.4 g/dL — ABNORMAL LOW (ref 12.0–15.0)
MCH: 31.5 pg (ref 26.0–34.0)
MCH: 32 pg (ref 26.0–34.0)
MCH: 31.3 pg (ref 26.0–34.0)
MCHC: 32.8 g/dL (ref 30.0–36.0)
MCHC: 34.2 g/dL (ref 30.0–36.0)
MCHC: 33.9 g/dL (ref 30.0–36.0)
MCV: 96.1 fL (ref 78.0–100.0)
MCV: 93.5 fL (ref 78.0–100.0)
MCV: 92.5 fL (ref 78.0–100.0)
PLATELETS: 116 10*3/uL — AB (ref 150–400)
Platelets: 141 10*3/uL — ABNORMAL LOW (ref 150–400)
Platelets: 121 10*3/uL — ABNORMAL LOW (ref 150–400)
RBC: 4.09 MIL/uL (ref 3.87–5.11)
RBC: 3.41 MIL/uL — AB (ref 3.87–5.11)
RBC: 3.32 MIL/uL — ABNORMAL LOW (ref 3.87–5.11)
RDW: 13.4 % (ref 11.5–15.5)
RDW: 15.6 % — AB (ref 11.5–15.5)
RDW: 15.9 % — ABNORMAL HIGH (ref 11.5–15.5)
WBC: 6 10*3/uL (ref 4.0–10.5)
WBC: 12.6 10*3/uL — AB (ref 4.0–10.5)
WBC: 11 10*3/uL — ABNORMAL HIGH (ref 4.0–10.5)

## 2017-03-04 LAB — POCT I-STAT 3, ART BLOOD GAS (G3+)
ACID-BASE DEFICIT: 2 mmol/L (ref 0.0–2.0)
ACID-BASE EXCESS: 1 mmol/L (ref 0.0–2.0)
Acid-base deficit: 2 mmol/L (ref 0.0–2.0)
Acid-base deficit: 3 mmol/L — ABNORMAL HIGH (ref 0.0–2.0)
BICARBONATE: 26.5 mmol/L (ref 20.0–28.0)
BICARBONATE: 24.4 mmol/L (ref 20.0–28.0)
BICARBONATE: 22.6 mmol/L (ref 20.0–28.0)
Bicarbonate: 23.1 mmol/L (ref 20.0–28.0)
O2 SAT: 100 %
O2 Saturation: 100 %
O2 Saturation: 91 %
O2 Saturation: 95 %
PCO2 ART: 41.4 mmHg (ref 32.0–48.0)
PH ART: 7.354 (ref 7.350–7.450)
PH ART: 7.381 (ref 7.350–7.450)
PO2 ART: 416 mmHg — AB (ref 83.0–108.0)
TCO2: 28 mmol/L (ref 22–32)
TCO2: 24 mmol/L (ref 22–32)
TCO2: 26 mmol/L (ref 22–32)
TCO2: 24 mmol/L (ref 22–32)
pCO2 arterial: 43.2 mmHg (ref 32.0–48.0)
pCO2 arterial: 49.7 mmHg — ABNORMAL HIGH (ref 32.0–48.0)
pCO2 arterial: 38.2 mmHg (ref 32.0–48.0)
pH, Arterial: 7.396 (ref 7.350–7.450)
pH, Arterial: 7.291 — ABNORMAL LOW (ref 7.350–7.450)
pO2, Arterial: 281 mmHg — ABNORMAL HIGH (ref 83.0–108.0)
pO2, Arterial: 63 mmHg — ABNORMAL LOW (ref 83.0–108.0)
pO2, Arterial: 79 mmHg — ABNORMAL LOW (ref 83.0–108.0)

## 2017-03-04 LAB — POCT I-STAT, CHEM 8
BUN: 14 mg/dL (ref 6–20)
BUN: 13 mg/dL (ref 6–20)
BUN: 12 mg/dL (ref 6–20)
BUN: 11 mg/dL (ref 6–20)
BUN: 12 mg/dL (ref 6–20)
BUN: 12 mg/dL (ref 6–20)
BUN: 11 mg/dL (ref 6–20)
CALCIUM ION: 0.87 mmol/L — AB (ref 1.15–1.40)
CALCIUM ION: 0.9 mmol/L — AB (ref 1.15–1.40)
CALCIUM ION: 0.88 mmol/L — AB (ref 1.15–1.40)
CHLORIDE: 105 mmol/L (ref 101–111)
CHLORIDE: 103 mmol/L (ref 101–111)
CHLORIDE: 104 mmol/L (ref 101–111)
CREATININE: 0.6 mg/dL (ref 0.44–1.00)
CREATININE: 0.6 mg/dL (ref 0.44–1.00)
CREATININE: 0.5 mg/dL (ref 0.44–1.00)
CREATININE: 0.7 mg/dL (ref 0.44–1.00)
Calcium, Ion: 1.2 mmol/L (ref 1.15–1.40)
Calcium, Ion: 1.08 mmol/L — ABNORMAL LOW (ref 1.15–1.40)
Calcium, Ion: 1 mmol/L — ABNORMAL LOW (ref 1.15–1.40)
Calcium, Ion: 1.05 mmol/L — ABNORMAL LOW (ref 1.15–1.40)
Chloride: 106 mmol/L (ref 101–111)
Chloride: 102 mmol/L (ref 101–111)
Chloride: 99 mmol/L — ABNORMAL LOW (ref 101–111)
Chloride: 106 mmol/L (ref 101–111)
Creatinine, Ser: 0.7 mg/dL (ref 0.44–1.00)
Creatinine, Ser: 0.6 mg/dL (ref 0.44–1.00)
Creatinine, Ser: 0.6 mg/dL (ref 0.44–1.00)
GLUCOSE: 143 mg/dL — AB (ref 65–99)
GLUCOSE: 127 mg/dL — AB (ref 65–99)
GLUCOSE: 135 mg/dL — AB (ref 65–99)
GLUCOSE: 178 mg/dL — AB (ref 65–99)
Glucose, Bld: 110 mg/dL — ABNORMAL HIGH (ref 65–99)
Glucose, Bld: 114 mg/dL — ABNORMAL HIGH (ref 65–99)
Glucose, Bld: 126 mg/dL — ABNORMAL HIGH (ref 65–99)
HCT: 26 % — ABNORMAL LOW (ref 36.0–46.0)
HCT: 22 % — ABNORMAL LOW (ref 36.0–46.0)
HCT: 27 % — ABNORMAL LOW (ref 36.0–46.0)
HEMATOCRIT: 36 % (ref 36.0–46.0)
HEMATOCRIT: 29 % — AB (ref 36.0–46.0)
HEMATOCRIT: 28 % — AB (ref 36.0–46.0)
HEMATOCRIT: 29 % — AB (ref 36.0–46.0)
HEMOGLOBIN: 9.9 g/dL — AB (ref 12.0–15.0)
HEMOGLOBIN: 9.5 g/dL — AB (ref 12.0–15.0)
HEMOGLOBIN: 8.8 g/dL — AB (ref 12.0–15.0)
HEMOGLOBIN: 7.5 g/dL — AB (ref 12.0–15.0)
HEMOGLOBIN: 9.2 g/dL — AB (ref 12.0–15.0)
HEMOGLOBIN: 9.9 g/dL — AB (ref 12.0–15.0)
Hemoglobin: 12.2 g/dL (ref 12.0–15.0)
POTASSIUM: 3.5 mmol/L (ref 3.5–5.1)
POTASSIUM: 4.8 mmol/L (ref 3.5–5.1)
POTASSIUM: 4.4 mmol/L (ref 3.5–5.1)
POTASSIUM: 4.7 mmol/L (ref 3.5–5.1)
Potassium: 4 mmol/L (ref 3.5–5.1)
Potassium: 3.9 mmol/L (ref 3.5–5.1)
Potassium: 5.3 mmol/L — ABNORMAL HIGH (ref 3.5–5.1)
SODIUM: 139 mmol/L (ref 135–145)
SODIUM: 137 mmol/L (ref 135–145)
SODIUM: 139 mmol/L (ref 135–145)
SODIUM: 142 mmol/L (ref 135–145)
Sodium: 140 mmol/L (ref 135–145)
Sodium: 139 mmol/L (ref 135–145)
Sodium: 137 mmol/L (ref 135–145)
TCO2: 28 mmol/L (ref 22–32)
TCO2: 24 mmol/L (ref 22–32)
TCO2: 26 mmol/L (ref 22–32)
TCO2: 30 mmol/L (ref 22–32)
TCO2: 24 mmol/L (ref 22–32)
TCO2: 25 mmol/L (ref 22–32)
TCO2: 26 mmol/L (ref 22–32)

## 2017-03-04 LAB — BASIC METABOLIC PANEL
Anion gap: 7 (ref 5–15)
BUN: 12 mg/dL (ref 6–20)
CO2: 26 mmol/L (ref 22–32)
Calcium: 8.9 mg/dL (ref 8.9–10.3)
Chloride: 104 mmol/L (ref 101–111)
Creatinine, Ser: 1.01 mg/dL — ABNORMAL HIGH (ref 0.44–1.00)
GFR calc Af Amer: >60 mL/min (ref >60)
GFR calc non Af Amer: 60 mL/min — ABNORMAL LOW (ref >60)
Glucose, Bld: 115 mg/dL — ABNORMAL HIGH (ref 65–99)
Potassium: 4.4 mmol/L (ref 3.5–5.1)
Sodium: 137 mmol/L (ref 135–145)

## 2017-03-04 LAB — PREPARE RBC (CROSSMATCH)

## 2017-03-04 LAB — GLUCOSE, CAPILLARY
GLUCOSE-CAPILLARY: 128 mg/dL — AB (ref 65–99)
GLUCOSE-CAPILLARY: 118 mg/dL — AB (ref 65–99)
Glucose-Capillary: 122 mg/dL — ABNORMAL HIGH (ref 65–99)
Glucose-Capillary: 96 mg/dL (ref 65–99)
Glucose-Capillary: 115 mg/dL — ABNORMAL HIGH (ref 65–99)

## 2017-03-04 LAB — PROTIME-INR
INR: 1.39
Prothrombin Time: 17 seconds — ABNORMAL HIGH (ref 11.4–15.2)

## 2017-03-04 LAB — CREATININE, SERUM
Creatinine, Ser: 0.75 mg/dL (ref 0.44–1.00)
GFR calc Af Amer: >60 mL/min (ref >60)
GFR calc non Af Amer: >60 mL/min (ref >60)

## 2017-03-04 LAB — APTT: APTT: 38 s — AB (ref 24–36)

## 2017-03-04 LAB — HEMOGLOBIN AND HEMATOCRIT, BLOOD
HCT: 26.6 % — ABNORMAL LOW (ref 36.0–46.0)
Hemoglobin: 9.1 g/dL — ABNORMAL LOW (ref 12.0–15.0)

## 2017-03-04 LAB — MAGNESIUM: Magnesium: 3.5 mg/dL — ABNORMAL HIGH (ref 1.7–2.4)

## 2017-03-04 LAB — PLATELET COUNT: Platelets: 60 10*3/uL — ABNORMAL LOW (ref 150–400)

## 2017-03-04 SURGERY — REPLACEMENT, MITRAL VALVE
Anesthesia: General | Site: Chest

## 2017-03-04 SURGERY — Surgical Case
Anesthesia: *Unknown

## 2017-03-04 MED ORDER — DEXTROSE 5 % IV SOLN
INTRAVENOUS | Status: DC | PRN
  Administered 2017-03-04: 750 mg via INTRAVENOUS

## 2017-03-04 MED ORDER — SODIUM CHLORIDE 0.9 % IV SOLN: Freq: Once | INTRAVENOUS | Status: DC

## 2017-03-04 MED ORDER — LACTATED RINGERS IV SOLN: 500.0000 mL | Freq: Once | INTRAVENOUS | Status: DC | PRN

## 2017-03-04 MED ORDER — 0.9 % SODIUM CHLORIDE (POUR BTL) OPTIME
TOPICAL | Status: DC | PRN
  Administered 2017-03-04: 6000 mL

## 2017-03-04 MED ORDER — MORPHINE SULFATE (PF) 4 MG/ML IV SOLN
2.0000 mg | INTRAVENOUS | Status: DC | PRN
  Administered 2017-03-05 (×2): 4 mg via INTRAVENOUS
  Administered 2017-03-06: 2 mg via INTRAVENOUS
  Filled 2017-03-04 (×3): qty 1

## 2017-03-04 MED ORDER — SODIUM CHLORIDE 0.9 % IV SOLN
INTRAVENOUS | Status: DC
  Filled 2017-03-04: qty 1

## 2017-03-04 MED ORDER — FENTANYL CITRATE (PF) 250 MCG/5ML IJ SOLN
INTRAMUSCULAR | Status: AC
  Filled 2017-03-04: qty 25

## 2017-03-04 MED ORDER — ASPIRIN EC 325 MG PO TBEC
325.0000 mg | DELAYED_RELEASE_TABLET | Freq: Every day | ORAL | Status: DC
  Administered 2017-03-05: 325 mg via ORAL
  Filled 2017-03-04: qty 1

## 2017-03-04 MED ORDER — SUCCINYLCHOLINE CHLORIDE 200 MG/10ML IV SOSY
PREFILLED_SYRINGE | INTRAVENOUS | Status: AC
  Filled 2017-03-04: qty 10

## 2017-03-04 MED ORDER — CHLORHEXIDINE GLUCONATE CLOTH 2 % EX PADS
6.0000 | MEDICATED_PAD | Freq: Every day | CUTANEOUS | Status: DC
  Administered 2017-03-04 – 2017-03-07 (×4): 6 via TOPICAL

## 2017-03-04 MED ORDER — LEVALBUTEROL HCL 1.25 MG/0.5ML IN NEBU
1.2500 mg | INHALATION_SOLUTION | Freq: Four times a day (QID) | RESPIRATORY_TRACT | Status: DC
  Administered 2017-03-04 – 2017-03-06 (×7): 1.25 mg via RESPIRATORY_TRACT
  Filled 2017-03-04 (×7): qty 0.5

## 2017-03-04 MED ORDER — SODIUM CHLORIDE 0.9 % IJ SOLN
OROMUCOSAL | Status: DC | PRN
  Administered 2017-03-04 (×5): 4 mL via TOPICAL

## 2017-03-04 MED ORDER — METOPROLOL TARTRATE 25 MG/10 ML ORAL SUSPENSION: 12.5000 mg | Freq: Two times a day (BID) | ORAL | Status: DC

## 2017-03-04 MED ORDER — ACETAMINOPHEN 160 MG/5ML PO SOLN
1000.0000 mg | Freq: Four times a day (QID) | ORAL | Status: DC
  Administered 2017-03-05: 1000 mg
  Filled 2017-03-04: qty 40.6

## 2017-03-04 MED ORDER — SODIUM CHLORIDE 0.9% FLUSH: 3.0000 mL | INTRAVENOUS | Status: DC | PRN

## 2017-03-04 MED ORDER — BISACODYL 10 MG RE SUPP: 10.0000 mg | Freq: Every day | RECTAL | Status: DC

## 2017-03-04 MED ORDER — OXYCODONE HCL 5 MG PO TABS
5.0000 mg | ORAL_TABLET | ORAL | Status: DC | PRN
  Administered 2017-03-05 – 2017-03-06 (×3): 10 mg via ORAL
  Filled 2017-03-04 (×3): qty 2

## 2017-03-04 MED ORDER — PHENYLEPHRINE HCL 10 MG/ML IJ SOLN
INTRAMUSCULAR | Status: DC | PRN
  Administered 2017-03-04 (×2): 80 ug via INTRAVENOUS
  Administered 2017-03-04 (×2): 160 ug via INTRAVENOUS

## 2017-03-04 MED ORDER — HEPARIN SODIUM (PORCINE) 1000 UNIT/ML IJ SOLN
INTRAMUSCULAR | Status: AC
  Filled 2017-03-04: qty 1

## 2017-03-04 MED ORDER — LIDOCAINE 2% (20 MG/ML) 5 ML SYRINGE
INTRAMUSCULAR | Status: AC
  Filled 2017-03-04: qty 5

## 2017-03-04 MED ORDER — MOMETASONE FURO-FORMOTEROL FUM 200-5 MCG/ACT IN AERO
2.0000 | INHALATION_SPRAY | Freq: Two times a day (BID) | RESPIRATORY_TRACT | Status: DC
  Administered 2017-03-06 – 2017-03-11 (×10): 2 via RESPIRATORY_TRACT
  Filled 2017-03-04 (×2): qty 8.8

## 2017-03-04 MED ORDER — DEXTROSE 5 % IV SOLN
1.5000 g | Freq: Two times a day (BID) | INTRAVENOUS | Status: AC
  Administered 2017-03-04 – 2017-03-06 (×4): 1.5 g via INTRAVENOUS
  Filled 2017-03-04 (×4): qty 1.5

## 2017-03-04 MED ORDER — MILRINONE LACTATE IN DEXTROSE 20-5 MG/100ML-% IV SOLN
0.1250 ug/kg/min | INTRAVENOUS | Status: DC
  Administered 2017-03-04: 0.25 ug/kg/min via INTRAVENOUS
  Filled 2017-03-04: qty 100

## 2017-03-04 MED ORDER — INSULIN REGULAR BOLUS VIA INFUSION
0.0000 [IU] | Freq: Three times a day (TID) | INTRAVENOUS | Status: DC
  Filled 2017-03-04: qty 10

## 2017-03-04 MED ORDER — MILRINONE LACTATE IN DEXTROSE 20-5 MG/100ML-% IV SOLN
0.1250 ug/kg/min | INTRAVENOUS | Status: AC
  Administered 2017-03-04: 0.25 ug/kg/min via INTRAVENOUS
  Filled 2017-03-04: qty 100

## 2017-03-04 MED ORDER — MIDAZOLAM HCL 2 MG/2ML IJ SOLN
2.0000 mg | INTRAMUSCULAR | Status: DC | PRN
  Administered 2017-03-04 – 2017-03-05 (×4): 2 mg via INTRAVENOUS
  Filled 2017-03-04 (×5): qty 2

## 2017-03-04 MED ORDER — MIDAZOLAM HCL 10 MG/2ML IJ SOLN
INTRAMUSCULAR | Status: AC
  Filled 2017-03-04: qty 2

## 2017-03-04 MED ORDER — CALCIUM CHLORIDE 10 % IV SOLN
INTRAVENOUS | Status: DC | PRN
  Administered 2017-03-04 (×3): 100 mg via INTRAVENOUS

## 2017-03-04 MED ORDER — ONDANSETRON HCL 4 MG/2ML IJ SOLN
4.0000 mg | Freq: Four times a day (QID) | INTRAMUSCULAR | Status: DC | PRN
Start: 2017-03-04 — End: 2017-03-07

## 2017-03-04 MED ORDER — METOCLOPRAMIDE HCL 5 MG/ML IJ SOLN
10.0000 mg | Freq: Four times a day (QID) | INTRAMUSCULAR | Status: DC
  Administered 2017-03-04 – 2017-03-07 (×12): 10 mg via INTRAVENOUS
  Filled 2017-03-04 (×11): qty 2

## 2017-03-04 MED ORDER — DEXMEDETOMIDINE HCL IN NACL 400 MCG/100ML IV SOLN
0.0000 ug/kg/h | INTRAVENOUS | Status: DC
  Administered 2017-03-04: 0.4 ug/kg/h via INTRAVENOUS
  Administered 2017-03-05: 0.7 ug/kg/h via INTRAVENOUS
  Filled 2017-03-04 (×3): qty 100

## 2017-03-04 MED ORDER — SODIUM CHLORIDE 0.9 % IV SOLN: INTRAVENOUS | Status: DC

## 2017-03-04 MED ORDER — ROCURONIUM BROMIDE 10 MG/ML (PF) SYRINGE
PREFILLED_SYRINGE | INTRAVENOUS | Status: DC | PRN
Start: 2017-03-04 — End: 2017-03-04
  Administered 2017-03-04 (×3): 50 mg via INTRAVENOUS

## 2017-03-04 MED ORDER — DOPAMINE-DEXTROSE 3.2-5 MG/ML-% IV SOLN: 0.0000 ug/kg/min | INTRAVENOUS | Status: DC

## 2017-03-04 MED ORDER — ROCURONIUM BROMIDE 10 MG/ML (PF) SYRINGE
PREFILLED_SYRINGE | INTRAVENOUS | Status: AC
  Filled 2017-03-04: qty 5

## 2017-03-04 MED ORDER — METOPROLOL TARTRATE 5 MG/5ML IV SOLN: 2.5000 mg | INTRAVENOUS | Status: DC | PRN

## 2017-03-04 MED ORDER — LACTATED RINGERS IV SOLN
INTRAVENOUS | Status: DC
  Administered 2017-03-05: 06:00:00 via INTRAVENOUS

## 2017-03-04 MED ORDER — HEMOSTATIC AGENTS (NO CHARGE) OPTIME
TOPICAL | Status: DC | PRN
  Administered 2017-03-04: 1 via TOPICAL

## 2017-03-04 MED ORDER — HEPARIN SODIUM (PORCINE) 1000 UNIT/ML IJ SOLN
INTRAMUSCULAR | Status: DC | PRN
  Administered 2017-03-04: 25000 [IU] via INTRAVENOUS

## 2017-03-04 MED ORDER — CHLORHEXIDINE GLUCONATE 0.12% ORAL RINSE (MEDLINE KIT)
15.0000 mL | Freq: Two times a day (BID) | OROMUCOSAL | Status: DC
  Administered 2017-03-04 – 2017-03-05 (×2): 15 mL via OROMUCOSAL

## 2017-03-04 MED ORDER — ARTIFICIAL TEARS OPHTHALMIC OINT
TOPICAL_OINTMENT | OPHTHALMIC | Status: DC | PRN
  Administered 2017-03-04: 1 via OPHTHALMIC

## 2017-03-04 MED ORDER — SODIUM CHLORIDE 0.9% FLUSH
10.0000 mL | Freq: Two times a day (BID) | INTRAVENOUS | Status: DC
  Administered 2017-03-06: 20 mL
  Administered 2017-03-06 – 2017-03-07 (×2): 10 mL

## 2017-03-04 MED ORDER — TRAMADOL HCL 50 MG PO TABS: 50.0000 mg | ORAL_TABLET | ORAL | Status: DC | PRN

## 2017-03-04 MED ORDER — BISACODYL 5 MG PO TBEC
10.0000 mg | DELAYED_RELEASE_TABLET | Freq: Every day | ORAL | Status: DC
  Administered 2017-03-05 – 2017-03-07 (×3): 10 mg via ORAL
  Filled 2017-03-04 (×3): qty 2

## 2017-03-04 MED ORDER — LACTATED RINGERS IV SOLN
INTRAVENOUS | Status: DC | PRN
  Administered 2017-03-04 (×2): via INTRAVENOUS

## 2017-03-04 MED ORDER — PROPOFOL 10 MG/ML IV BOLUS
INTRAVENOUS | Status: AC
  Filled 2017-03-04: qty 20

## 2017-03-04 MED ORDER — MAGNESIUM SULFATE 4 GM/100ML IV SOLN
4.0000 g | Freq: Once | INTRAVENOUS | Status: AC
  Administered 2017-03-04: 4 g via INTRAVENOUS
  Filled 2017-03-04: qty 100

## 2017-03-04 MED ORDER — SODIUM CHLORIDE 0.9% FLUSH
3.0000 mL | Freq: Two times a day (BID) | INTRAVENOUS | Status: DC
  Administered 2017-03-05 – 2017-03-07 (×4): 3 mL via INTRAVENOUS

## 2017-03-04 MED ORDER — ORAL CARE MOUTH RINSE
15.0000 mL | OROMUCOSAL | Status: DC
  Administered 2017-03-04 – 2017-03-05 (×8): 15 mL via OROMUCOSAL

## 2017-03-04 MED ORDER — ACETAMINOPHEN 160 MG/5ML PO SOLN: 650.0000 mg | Freq: Once | ORAL | Status: AC

## 2017-03-04 MED ORDER — METOPROLOL TARTRATE 12.5 MG HALF TABLET
12.5000 mg | ORAL_TABLET | Freq: Two times a day (BID) | ORAL | Status: DC
  Administered 2017-03-05 – 2017-03-06 (×2): 12.5 mg via ORAL
  Filled 2017-03-04 (×2): qty 1

## 2017-03-04 MED ORDER — ACETAMINOPHEN 650 MG RE SUPP
650.0000 mg | Freq: Once | RECTAL | Status: AC
  Administered 2017-03-04: 650 mg via RECTAL

## 2017-03-04 MED ORDER — INSULIN ASPART 100 UNIT/ML ~~LOC~~ SOLN
0.0000 [IU] | SUBCUTANEOUS | Status: DC
  Administered 2017-03-04 – 2017-03-05 (×5): 2 [IU] via SUBCUTANEOUS

## 2017-03-04 MED ORDER — AMIODARONE HCL IN DEXTROSE 360-4.14 MG/200ML-% IV SOLN
30.0000 mg/h | INTRAVENOUS | Status: DC
  Administered 2017-03-04 – 2017-03-05 (×2): 30 mg/h via INTRAVENOUS
  Filled 2017-03-04: qty 200

## 2017-03-04 MED ORDER — ASPIRIN 81 MG PO CHEW: 324.0000 mg | CHEWABLE_TABLET | Freq: Every day | ORAL | Status: DC

## 2017-03-04 MED ORDER — PHENYLEPHRINE HCL 10 MG/ML IJ SOLN
INTRAMUSCULAR | Status: DC | PRN
  Administered 2017-03-04: 40 ug/min via INTRAVENOUS

## 2017-03-04 MED ORDER — LACTATED RINGERS IV SOLN: INTRAVENOUS | Status: DC

## 2017-03-04 MED ORDER — SODIUM CHLORIDE 0.9% FLUSH: 10.0000 mL | INTRAVENOUS | Status: DC | PRN

## 2017-03-04 MED ORDER — NITROGLYCERIN IN D5W 200-5 MCG/ML-% IV SOLN: 0.0000 ug/min | INTRAVENOUS | Status: DC

## 2017-03-04 MED ORDER — PROTAMINE SULFATE 10 MG/ML IV SOLN
INTRAVENOUS | Status: DC | PRN
  Administered 2017-03-04: 250 mg via INTRAVENOUS

## 2017-03-04 MED ORDER — DOCUSATE SODIUM 100 MG PO CAPS
200.0000 mg | ORAL_CAPSULE | Freq: Every day | ORAL | Status: DC
  Administered 2017-03-05 – 2017-03-07 (×3): 200 mg via ORAL
  Filled 2017-03-04 (×3): qty 2

## 2017-03-04 MED ORDER — PROPOFOL 10 MG/ML IV BOLUS
INTRAVENOUS | Status: DC | PRN
  Administered 2017-03-04: 30 mg via INTRAVENOUS

## 2017-03-04 MED ORDER — SODIUM CHLORIDE 0.45 % IV SOLN
INTRAVENOUS | Status: DC | PRN
  Administered 2017-03-04: 13:00:00 via INTRAVENOUS

## 2017-03-04 MED ORDER — SODIUM CHLORIDE 0.9 % IV SOLN: 250.0000 mL | INTRAVENOUS | Status: DC

## 2017-03-04 MED ORDER — ACETAMINOPHEN 500 MG PO TABS
1000.0000 mg | ORAL_TABLET | Freq: Four times a day (QID) | ORAL | Status: DC
  Administered 2017-03-05 – 2017-03-07 (×10): 1000 mg via ORAL
  Filled 2017-03-04 (×10): qty 2

## 2017-03-04 MED ORDER — ALBUMIN HUMAN 5 % IV SOLN
250.0000 mL | INTRAVENOUS | Status: AC | PRN
  Administered 2017-03-04 – 2017-03-05 (×3): 250 mL via INTRAVENOUS
  Filled 2017-03-04 (×2): qty 250

## 2017-03-04 MED ORDER — ALBUMIN HUMAN 5 % IV SOLN
INTRAVENOUS | Status: DC | PRN
  Administered 2017-03-04 (×3): via INTRAVENOUS

## 2017-03-04 MED ORDER — CHLORHEXIDINE GLUCONATE 0.12 % MT SOLN
15.0000 mL | OROMUCOSAL | Status: AC
  Administered 2017-03-04: 15 mL via OROMUCOSAL

## 2017-03-04 MED ORDER — SODIUM CHLORIDE 0.9 % IV SOLN
Freq: Once | INTRAVENOUS | Status: AC
  Administered 2017-03-04: 14:00:00 via INTRAVENOUS

## 2017-03-04 MED ORDER — MIDAZOLAM HCL 5 MG/5ML IJ SOLN
INTRAMUSCULAR | Status: DC | PRN
  Administered 2017-03-04 (×2): 2 mg via INTRAVENOUS
  Administered 2017-03-04 (×2): 3 mg via INTRAVENOUS

## 2017-03-04 MED ORDER — GLYCOPYRROLATE 0.2 MG/ML IJ SOLN
INTRAMUSCULAR | Status: DC | PRN
  Administered 2017-03-04: .2 mg via INTRAVENOUS

## 2017-03-04 MED ORDER — SODIUM CHLORIDE 0.9 % IV SOLN
0.0000 ug/min | INTRAVENOUS | Status: DC
  Administered 2017-03-05 (×2): 30 ug/min via INTRAVENOUS
  Filled 2017-03-04 (×2): qty 2
  Filled 2017-03-04: qty 20

## 2017-03-04 MED ORDER — PANTOPRAZOLE SODIUM 40 MG PO TBEC
40.0000 mg | DELAYED_RELEASE_TABLET | Freq: Every day | ORAL | Status: DC
  Administered 2017-03-06 – 2017-03-07 (×2): 40 mg via ORAL
  Filled 2017-03-04 (×2): qty 1

## 2017-03-04 MED ORDER — VANCOMYCIN HCL IN DEXTROSE 1-5 GM/200ML-% IV SOLN
1000.0000 mg | Freq: Once | INTRAVENOUS | Status: AC
  Administered 2017-03-04: 1000 mg via INTRAVENOUS
  Filled 2017-03-04: qty 200

## 2017-03-04 MED ORDER — FAMOTIDINE IN NACL 20-0.9 MG/50ML-% IV SOLN
20.0000 mg | Freq: Two times a day (BID) | INTRAVENOUS | Status: AC
  Administered 2017-03-04: 20 mg via INTRAVENOUS

## 2017-03-04 MED ORDER — POTASSIUM CHLORIDE 10 MEQ/50ML IV SOLN
10.0000 meq | INTRAVENOUS | Status: AC
  Administered 2017-03-04 (×3): 10 meq via INTRAVENOUS

## 2017-03-04 MED ORDER — MORPHINE SULFATE (PF) 4 MG/ML IV SOLN
1.0000 mg | INTRAVENOUS | Status: DC | PRN
  Administered 2017-03-04: 4 mg via INTRAVENOUS
  Filled 2017-03-04 (×2): qty 1

## 2017-03-04 MED ORDER — FENTANYL CITRATE (PF) 250 MCG/5ML IJ SOLN
INTRAMUSCULAR | Status: DC | PRN
  Administered 2017-03-04 (×2): 100 ug via INTRAVENOUS
  Administered 2017-03-04: 150 ug via INTRAVENOUS
  Administered 2017-03-04: 450 ug via INTRAVENOUS
  Administered 2017-03-04: 150 ug via INTRAVENOUS
  Administered 2017-03-04: 50 ug via INTRAVENOUS

## 2017-03-04 MED ORDER — PROTAMINE SULFATE 10 MG/ML IV SOLN
INTRAVENOUS | Status: AC
  Filled 2017-03-04: qty 25

## 2017-03-04 SURGICAL SUPPLY — 116 items
ADAPTER CARDIO PERF ANTE/RETRO (ADAPTER) ×4 IMPLANT
APPLICATOR COTTON TIP 6IN STRL (MISCELLANEOUS) ×4 IMPLANT
ATRICLIP EXCLUSION 45 FLEX HDL (Clip) ×4 IMPLANT
ATTRACTOMAT 16X20 MAGNETIC DRP (DRAPES) ×4 IMPLANT
BAG DECANTER FOR FLEXI CONT (MISCELLANEOUS) ×4 IMPLANT
BLADE STERNUM SYSTEM 6 (BLADE) ×4 IMPLANT
BLADE SURG 15 STRL LF DISP TIS (BLADE) ×2 IMPLANT
BLADE SURG 15 STRL SS (BLADE) ×2
CANISTER SUCT 3000ML PPV (MISCELLANEOUS) ×4 IMPLANT
CANN PRFSN 3/8XCNCT ST RT ANG (MISCELLANEOUS) ×2
CANN PRFSN 3/8XRT ANG TPR 14 (MISCELLANEOUS) ×2
CANNULA AORTIC ROOT 20012 (MISCELLANEOUS) ×3 IMPLANT
CANNULA AORTIC ROOT 9FR (CANNULA) ×4 IMPLANT
CANNULA ARTERIAL NVNT 3/8 20FR (MISCELLANEOUS) ×4 IMPLANT
CANNULA GUNDRY RCSP 15FR (MISCELLANEOUS) ×4 IMPLANT
CANNULA PRFSN 3/8XCNCT RT ANG (MISCELLANEOUS) ×2 IMPLANT
CANNULA PRFSN 3/8XRT ANG TPR14 (MISCELLANEOUS) ×2 IMPLANT
CANNULA SUMP PERICARDIAL (CANNULA) ×4 IMPLANT
CANNULA VEN MTL TIP RT (MISCELLANEOUS) ×4
CARDIOBLATE CARDIAC ABLATION (MISCELLANEOUS) ×4
CATH RETROPLEGIA CORONARY 14FR (CATHETERS) ×4 IMPLANT
CATH ROBINSON RED A/P 18FR (CATHETERS) ×12 IMPLANT
CATH THORACIC 36FR (CATHETERS) ×4 IMPLANT
CATH THORACIC 36FR RT ANG (CATHETERS) ×4 IMPLANT
CLAMP OLL ABLATION (MISCELLANEOUS) ×4 IMPLANT
CLIP FOGARTY SPRING 6M (CLIP) ×4 IMPLANT
CONN 1/2X1/2X1/2  BEN (MISCELLANEOUS) ×4
CONN 1/2X1/2X1/2 BEN (MISCELLANEOUS) ×4 IMPLANT
CONN 3/8X1/2 ST GISH (MISCELLANEOUS) ×16 IMPLANT
CONT SPEC 4OZ CLIKSEAL STRL BL (MISCELLANEOUS) ×4 IMPLANT
COVER SURGICAL LIGHT HANDLE (MISCELLANEOUS) ×12 IMPLANT
CRADLE DONUT ADULT HEAD (MISCELLANEOUS) ×4 IMPLANT
DEVICE CARDIOBLATE CARDIAC ABL (MISCELLANEOUS) ×2 IMPLANT
DRAPE CARDIOVASCULAR INCISE (DRAPES) ×2
DRAPE SLUSH/WARMER DISC (DRAPES) IMPLANT
DRAPE SRG 135X102X78XABS (DRAPES) ×2 IMPLANT
DRSG AQUACEL AG ADV 3.5X14 (GAUZE/BANDAGES/DRESSINGS) ×8 IMPLANT
DRSG COVADERM 4X14 (GAUZE/BANDAGES/DRESSINGS) ×4 IMPLANT
ELECT BLADE 4.0 EZ CLEAN MEGAD (MISCELLANEOUS) ×4
ELECT CAUTERY BLADE 6.4 (BLADE) ×4 IMPLANT
ELECT REM PT RETURN 9FT ADLT (ELECTROSURGICAL) ×8
ELECTRODE BLDE 4.0 EZ CLN MEGD (MISCELLANEOUS) ×2 IMPLANT
ELECTRODE REM PT RTRN 9FT ADLT (ELECTROSURGICAL) ×4 IMPLANT
FELT TEFLON 1X6 (MISCELLANEOUS) ×12 IMPLANT
FLOSEAL 10ML (HEMOSTASIS) ×4 IMPLANT
GAUZE SPONGE 4X4 12PLY STRL (GAUZE/BANDAGES/DRESSINGS) ×4 IMPLANT
GLOVE BIO SURGEON STRL SZ 6 (GLOVE) ×4 IMPLANT
GLOVE BIO SURGEON STRL SZ 6.5 (GLOVE) ×18 IMPLANT
GLOVE BIO SURGEON STRL SZ7 (GLOVE) ×4 IMPLANT
GLOVE BIO SURGEON STRL SZ8 (GLOVE) ×8 IMPLANT
GLOVE BIO SURGEONS STRL SZ 6.5 (GLOVE) ×6
GLOVE BIOGEL PI IND STRL 6 (GLOVE) ×2 IMPLANT
GLOVE BIOGEL PI INDICATOR 6 (GLOVE) ×2
GLOVE EUDERMIC 7 POWDERFREE (GLOVE) ×8 IMPLANT
GLOVE SS N UNI LF 6.0 STRL (GLOVE) ×4 IMPLANT
GLOVE SS N UNI LF 6.5 STRL (GLOVE) ×4 IMPLANT
GOWN STRL REUS W/ TWL LRG LVL3 (GOWN DISPOSABLE) ×16 IMPLANT
GOWN STRL REUS W/ TWL XL LVL3 (GOWN DISPOSABLE) ×4 IMPLANT
GOWN STRL REUS W/TWL LRG LVL3 (GOWN DISPOSABLE) ×16
GOWN STRL REUS W/TWL XL LVL3 (GOWN DISPOSABLE) ×4
HEMOSTAT POWDER SURGIFOAM 1G (HEMOSTASIS) ×12 IMPLANT
HEMOSTAT SURGICEL 2X14 (HEMOSTASIS) ×4 IMPLANT
INSERT FOGARTY XLG (MISCELLANEOUS) IMPLANT
KIT BASIN OR (CUSTOM PROCEDURE TRAY) ×4 IMPLANT
KIT CATH CPB BARTLE (MISCELLANEOUS) ×4 IMPLANT
KIT ROOM TURNOVER OR (KITS) ×4 IMPLANT
KIT SUCTION CATH 14FR (SUCTIONS) ×4 IMPLANT
LINE VENT (MISCELLANEOUS) ×4 IMPLANT
LOOP VESSEL SUPERMAXI WHITE (MISCELLANEOUS) ×12 IMPLANT
NS IRRIG 1000ML POUR BTL (IV SOLUTION) ×28 IMPLANT
PACK OPEN HEART (CUSTOM PROCEDURE TRAY) ×4 IMPLANT
PAD ARMBOARD 7.5X6 YLW CONV (MISCELLANEOUS) ×16 IMPLANT
PROBE CRYO2-ABLATION MALLABLE (MISCELLANEOUS) ×4 IMPLANT
SET CARDIOPLEGIA MPS 5001102 (MISCELLANEOUS) ×4 IMPLANT
SLEEVE SURGEON STRL (DRAPES) ×4 IMPLANT
SUCKER WEIGHTED FLEX (MISCELLANEOUS) ×4 IMPLANT
SUT BONE WAX W31G (SUTURE) ×4 IMPLANT
SUT ETHIBON 2 0 V 52N 30 (SUTURE) ×8 IMPLANT
SUT ETHIBOND 2 0 SH (SUTURE) ×10 IMPLANT
SUT ETHIBOND 2 0 SH 36X2 (SUTURE) ×2 IMPLANT
SUT ETHIBOND 2 0 V4 (SUTURE) IMPLANT
SUT ETHIBOND 2 0V4 GREEN (SUTURE) IMPLANT
SUT ETHIBOND 4 0 TF (SUTURE) ×4 IMPLANT
SUT ETHIBOND 5 0 C 1 30 (SUTURE) ×4 IMPLANT
SUT PROLENE 3 0 SH 1 (SUTURE) ×4 IMPLANT
SUT PROLENE 3 0 SH DA (SUTURE) ×12 IMPLANT
SUT PROLENE 3 0 SH1 36 (SUTURE) ×16 IMPLANT
SUT PROLENE 4 0 RB 1 (SUTURE) ×12
SUT PROLENE 4 0 SH DA (SUTURE) ×12 IMPLANT
SUT PROLENE 4-0 RB1 .5 CRCL 36 (SUTURE) ×12 IMPLANT
SUT PROLENE 5 0 C 1 36 (SUTURE) ×4 IMPLANT
SUT SILK  1 MH (SUTURE) ×2
SUT SILK 1 MH (SUTURE) ×2 IMPLANT
SUT SILK 1 TIES 10X30 (SUTURE) ×4 IMPLANT
SUT SILK 2 0 (SUTURE) ×2
SUT SILK 2 0 SH CR/8 (SUTURE) ×4 IMPLANT
SUT SILK 2-0 18XBRD TIE 12 (SUTURE) ×2 IMPLANT
SUT SILK 3 0 SH CR/8 (SUTURE) ×4 IMPLANT
SUT SILK 4 0 (SUTURE) ×2
SUT SILK 4-0 18XBRD TIE 12 (SUTURE) ×2 IMPLANT
SUT STEEL 6MS V (SUTURE) ×4 IMPLANT
SUT TEM PAC WIRE 2 0 SH (SUTURE) ×4 IMPLANT
SUT VIC AB 1 CTX 18 (SUTURE) ×4 IMPLANT
SUT VIC AB 1 CTX 36 (SUTURE) ×6
SUT VIC AB 1 CTX36XBRD ANBCTR (SUTURE) ×6 IMPLANT
SUT VIC AB 2-0 CTX 27 (SUTURE) ×4 IMPLANT
SUT VIC AB 3-0 X1 27 (SUTURE) ×4 IMPLANT
SYSTEM SAHARA CHEST DRAIN ATS (WOUND CARE) ×4 IMPLANT
TAPE CLOTH SURG 4X10 WHT LF (GAUZE/BANDAGES/DRESSINGS) ×4 IMPLANT
TAPE PAPER 2X10 WHT MICROPORE (GAUZE/BANDAGES/DRESSINGS) ×4 IMPLANT
TOWEL GREEN STERILE (TOWEL DISPOSABLE) ×8 IMPLANT
TOWEL GREEN STERILE FF (TOWEL DISPOSABLE) ×8 IMPLANT
TRAY FOLEY SILVER 16FR TEMP (SET/KITS/TRAYS/PACK) ×4 IMPLANT
UNDERPAD 30X30 (UNDERPADS AND DIAPERS) ×4 IMPLANT
VALVE MAGNA MITRAL 29MM (Prosthesis & Implant Heart) ×4 IMPLANT
WATER STERILE IRR 1000ML POUR (IV SOLUTION) ×8 IMPLANT

## 2017-03-04 NOTE — Progress Notes (Signed)
Preformed two recruitment maneuvers on pt to increase sats upon arrival to [facility]. Returned pt to [facility]/PS.

## 2017-03-04 NOTE — Brief Op Note (Signed)
02/27/2017 - 03/04/2017  11:33 AM  PATIENT:  Diane Nielsen  61 y.o. female  PRE-OPERATIVE DIAGNOSIS:  mitral regurgitation, a fib  POST-OPERATIVE DIAGNOSIS:  mitral regurgitation, a fib  PROCEDURE:  Procedure(s): MITRAL VALVE (MV) REPLACEMENT, MAZE PROCEDURE (N/A) MAZE (N/A) CLIPPING OF ATRIAL APPENDAGE (N/A) #29 Magnaease bioprosthetic Chordal sparing  SURGEON:  Surgeon(s) and Role:    Kerin Perna, MD - Primary  PHYSICIAN ASSISTANT: WAYNE GOLD PA-C  ANESTHESIA:   general  EBL:  400cc   BLOOD ADMINISTERED 2 U packed cells  DRAINS: MEDIASTINAL AND PLEURAL CHEST TUBES   LOCAL MEDICATIONS USED:  NONE  SPECIMEN:  Source of Specimen:  MITRAL LEAFLETS  DISPOSITION OF SPECIMEN:  PATHOLOGY  COUNTS:  YES  TOURNIQUET:  * No tourniquets in log *  DICTATION: .Other Dictation: Dictation Number PENDING  PLAN OF CARE: Admit to inpatient   PATIENT DISPOSITION:  ICU - intubated and hemodynamically stable.   Delay start of Pharmacological VTE agent (>24hrs) due to surgical blood loss or risk of bleeding: yes  COMPLICATIONS: NO KNOWN

## 2017-03-04 NOTE — Anesthesia Procedure Notes (Signed)
Arterial Line Insertion Start/End12/26/2018 6:55 AM, 03/04/2017 7:00 AM Performed by: Rosiland Oz, CRNA, CRNA  Patient location: Pre-op. Preanesthetic checklist: patient identified, IV checked, site marked, risks and benefits discussed, surgical consent, monitors and equipment checked, pre-op evaluation, timeout performed and anesthesia consent Lidocaine 1% used for infiltration and patient sedated radial was placed Catheter size: 20 G Hand hygiene performed , maximum sterile barriers used  and Seldinger technique used  Attempts: 1 Procedure performed without using ultrasound guided technique. Following insertion, dressing applied and Biopatch. Post procedure assessment: normal  Patient tolerated the procedure well with no immediate complications.

## 2017-03-04 NOTE — Anesthesia Postprocedure Evaluation (Signed)
Anesthesia Post Note  Patient: Diane Nielsen  Procedure(s) Performed: MITRAL VALVE (MV) REPLACEMENT, MAZE PROCEDURE (N/A Chest) MAZE (N/A ) CLIPPING OF ATRIAL APPENDAGE (N/A Chest)     Patient location during evaluation: SICU Anesthesia Type: General Level of consciousness: patient remains intubated per anesthesia plan and sedated Pain management: pain level controlled Vital Signs Assessment: post-procedure vital signs reviewed and stable Respiratory status: patient on ventilator - see flowsheet for VS and patient remains intubated per anesthesia plan Cardiovascular status: stable (stable with hemodynamic support) Postop Assessment: no apparent nausea or vomiting Anesthetic complications: no    Last Vitals:  Vitals:   03/04/17 1355 03/04/17 1415  BP:    Pulse:  88  Resp:  16  Temp:  (!) 35.1 C  SpO2: 94% 96%    Last Pain:  Vitals:   03/04/17 1415  TempSrc: Core (Comment)  PainSc:                  Erling Cruz. Jaelani Posa

## 2017-03-04 NOTE — Progress Notes (Signed)
  Echocardiogram Echocardiogram Transesophageal has been performed.  Tionne Carelli L Androw 03/04/2017, 8:38 AM

## 2017-03-04 NOTE — Progress Notes (Signed)
Pre Procedure note for inpatients:   Diane Nielsen has been scheduled for Procedure(s): MITRAL VALVE (MV) REPLACEMENT, MAZE PROCEDURE (N/A) MAZE (N/A) today. The various methods of treatment have been discussed with the patient. After consideration of the risks, benefits and treatment options the patient has consented to the planned procedure.   The patient has been seen and labs reviewed. There are no changes in the patient's condition to prevent proceeding with the planned procedure today.  Recent labs:  Lab Results  Component Value Date   WBC 6.0 03/04/2017   HGB 12.9 03/04/2017   HCT 39.3 03/04/2017   PLT 141 (L) 03/04/2017   GLUCOSE 115 (H) 03/04/2017   CHOL 136 02/28/2017   TRIG 48 02/28/2017   HDL 41 02/28/2017   LDLCALC 85 02/28/2017   ALT 51 03/02/2017   AST 32 03/02/2017   NA 137 03/04/2017   K 4.4 03/04/2017   CL 104 03/04/2017   CREATININE 1.01 (H) 03/04/2017   BUN 12 03/04/2017   CO2 26 03/04/2017   TSH 2.548 02/28/2017   INR 1.21 02/27/2017   HGBA1C 6.0 (H) 02/28/2017    Mikey Bussing, MD 03/04/2017 7:49 AM

## 2017-03-04 NOTE — Progress Notes (Signed)
FPTS Interim Progress Note  S: Patient remains intubated after mitral valve replacement.   O: BP 117/79 (BP Location: Right Arm)   Pulse 88   Temp 98.6 F (37 C)   Resp 16   Ht 5\' 5"  (1.651 m)   Wt 151 lb 14.4 oz (68.9 kg)   SpO2 95%   BMI 25.28 kg/m   General: In NAD Resp: Intubated  A/P: Status post MVR/Maze procedure/clipping of left atrial appendage: Patient doing well, she is still intubated. CVTS primary while patient intubated, will see socially for now until she is stable enough to leave ICU. Appreciate CVTS and cardiology excellent care.  Beaulah Dinning, MD 03/04/2017, 8:23 PM PGY-3, [facility] Family Medicine Service pager [phone]

## 2017-03-04 NOTE — Progress Notes (Signed)
Patient ID: Diane Nielsen, female   DOB: 10/01/1955, 61 y.o.   MRN: [id number] EVENING ROUNDS NOTE :     [address]       [place] [address]             [phone]                 Day of Surgery Procedure(s) (LRB): MITRAL VALVE (MV) REPLACEMENT, MAZE PROCEDURE (N/A) MAZE (N/A) CLIPPING OF ATRIAL APPENDAGE (N/A)  Total Length of Stay:  LOS: 5 days  BP 110/63   Pulse 88   Temp (!) 95.9 F (35.5 C) (Core)   Resp 16   Ht 5\' 5"  (1.651 m)   Wt 151 lb 14.4 oz (68.9 kg)   SpO2 97%   BMI 25.28 kg/m   .Intake/Output      12/25 0701 - 12/26 0700 12/26 0701 - 12/27 0700   P.O. 840 0   I.V. (mL/kg) 387.7 (5.6) 2428.4 (35.2)   Blood  1773   IV Piggyback 0 1500   Total Intake(mL/kg) 1227.7 (17.8) 5701.4 (82.7)   Urine (mL/kg/hr) 3850 (2.3) 2175 (3)   Stool     Blood  600   Chest Tube  120   Total Output 3850 2895   Net -2622.3 +2806.4          . sodium chloride 20 mL/hr at 03/04/17 1315  . [START ON 03/05/2017] sodium chloride    . sodium chloride 20 mL/hr at 03/04/17 1315  . albumin human    . amiodarone    . cefUROXime (ZINACEF)  IV    . dexmedetomidine (PRECEDEX) IV infusion 0.7 mcg/kg/hr (03/04/17 1338)  . DOPamine 2 mcg/kg/min (03/04/17 1338)  . insulin (NOVOLIN-R) infusion 1.7 Units/hr (03/04/17 1600)  . lactated ringers    . lactated ringers    . lactated ringers 20 mL/hr at 03/04/17 1340  . magnesium sulfate 4 g (03/04/17 1358)  . milrinone 0.25 mcg/kg/min (03/04/17 1340)  . nitroGLYCERIN Stopped (03/04/17 1342)  . phenylephrine (NEO-SYNEPHRINE) Adult infusion 15 mcg/min (03/04/17 1400)  . vancomycin       Lab Results  Component Value Date   WBC 12.6 (H) 03/04/2017   HGB 10.9 (L) 03/04/2017   HCT 31.9 (L) 03/04/2017   PLT 116 (L) 03/04/2017   GLUCOSE 134 (H) 03/04/2017   CHOL 136 02/28/2017   TRIG 48 02/28/2017   HDL 41 02/28/2017   LDLCALC 85 02/28/2017   ALT 51 03/02/2017   AST 32 03/02/2017   NA 144 03/04/2017   K 3.9  03/04/2017   CL 103 03/04/2017   CREATININE 0.60 03/04/2017   BUN 12 03/04/2017   CO2 26 03/04/2017   TSH 2.548 02/28/2017   INR 1.39 03/04/2017   HGBA1C 6.0 (H) 02/28/2017   Initial po2 low now improved Waking up, now down to 40% fio2 Not bleeding   Delight Ovens MD  Beeper [phone] Office [phone] 03/04/2017 5:33 PM

## 2017-03-04 NOTE — Anesthesia Procedure Notes (Signed)
Procedure Name: Intubation Date/Time: 03/04/2017 8:01 AM Performed by: Rosiland Oz, CRNA Pre-anesthesia Checklist: Patient identified, Emergency Drugs available, Suction available, Patient being monitored and Timeout performed Patient Re-evaluated:Patient Re-evaluated prior to induction Oxygen Delivery Method: Circle system utilized Preoxygenation: Pre-oxygenation with 100% oxygen Induction Type: IV induction Ventilation: Mask ventilation without difficulty Laryngoscope Size: Miller and 3 Grade View: Grade I Tube type: Oral Tube size: 7.5 mm Number of attempts: 1 Airway Equipment and Method: Stylet Placement Confirmation: ETT inserted through vocal cords under direct vision,  positive ETCO2 and breath sounds checked- equal and bilateral Secured at: 22 cm Tube secured with: Tape Dental Injury: Teeth and Oropharynx as per pre-operative assessment

## 2017-03-04 NOTE — OR Nursing (Signed)
12:05 - 45 minute call to SICU 12:40 - 20 minute call to SICU

## 2017-03-04 NOTE — Progress Notes (Signed)
Subjective:  Intubated on low-dose pressors being weaned off from vent. Tolerated MVR/Maze procedure/clipping of left atrial appendage  Objective:  Vital Signs in the last 24 hours: Temp:  [95.2 F (35.1 C)-99.5 F (37.5 C)] 99.5 F (37.5 C) (12/26 1745) Pulse Rate:  [70-90] 88 (12/26 1745) Resp:  [12-25] 16 (12/26 1745) BP: (88-112)/(51-86) 110/63 (12/26 1546) SpO2:  [88 %-99 %] 94 % (12/26 1745) Arterial Line BP: (85-123)/(49-69) 102/57 (12/26 1745) FiO2 (%):  [50 %-80 %] 70 % (12/26 1546) Weight:  [68.9 kg (151 lb 14.4 oz)] 68.9 kg (151 lb 14.4 oz) (12/26 0358)  Intake/Output from previous day: 12/25 0701 - 12/26 0700 In: 1227.7 [P.O.:840; I.V.:387.7] Out: 3850 [Urine:3850] Intake/Output from this shift: Total I/O In: 5814.3 [I.V.:2541.3; Blood:1773; IV Piggyback:1500] Out: 3125 [Urine:2375; Blood:600; Chest Tube:150]  Physical Exam: Neck: no adenopathy, no carotid bruit, no JVD and supple, symmetrical, trachea midline Lungs: Clear to auscultation anteriorly Heart: regular rate and rhythm, S1, S2 normal and Atrial paced rhythm Abdomen: soft, non-tender; bowel sounds normal; no masses,  no organomegaly Extremities: extremities normal, atraumatic, no cyanosis or edema and Right groin stable  Lab Results: Recent Labs    03/04/17 0454  03/04/17 1103  03/04/17 1327 03/04/17 1329  WBC 6.0  --   --   --   --  12.6*  HGB 12.9   < > 9.1*   < > 10.9* 10.9*  PLT 141*  --  60*  --   --  116*   < > = values in this interval not displayed.   Recent Labs    03/03/17 0546 03/04/17 0454  03/04/17 1140 03/04/17 1211 03/04/17 1327  NA 137 137   < > 137 139 144  K 4.1 4.4   < > 5.3* 4.4 3.9  CL 104 104   < > 104 103  --   CO2 27 26  --   --   --   --   GLUCOSE 100* 115*   < > 135* 178* 134*  BUN 10 12   < > 12 12  --   CREATININE 0.88 1.01*   < > 0.60 0.60  --    < > = values in this interval not displayed.   No results for input(s): TROPONINI in the last 72  hours.  Invalid input(s): CK, MB Hepatic Function Panel Recent Labs    03/02/17 0550  PROT 5.8*  ALBUMIN 3.1*  AST 32  ALT 51  ALKPHOS 59  BILITOT 0.7   No results for input(s): CHOL in the last 72 hours. No results for input(s): PROTIME in the last 72 hours.  Imaging: Imaging results have been reviewed and Dg Orthopantogram  Result Date: 03/03/2017 CLINICAL DATA:  Preop mitral valve surgery.  Possible jaw infection. EXAM: ORTHOPANTOGRAM/PANORAMIC COMPARISON:  None. FINDINGS: No evidence of fracture or dislocation. No focal bone destruction to suggest osteomyelitis. 1.3 cm well-defined sclerotic focus over the left mandible. IMPRESSION: No acute findings. 1.3 cm well-defined benign-appearing sclerotic focus over the left mandible. Electronically Signed   By: Elberta Fortisaniel  Boyle M.D.   On: 03/03/2017 08:01   Dg Chest 2 View  Result Date: 03/03/2017 CLINICAL DATA:  Shortness of Breath EXAM: CHEST  2 VIEW COMPARISON:  02/27/2017 FINDINGS: Improvement in the pulmonary edema pattern from prior study. Mild hyperinflation. Mild cardiomegaly. No confluent opacities or effusions. IMPRESSION: Improvement in the CHF pattern. Cardiomegaly.  Hyperinflation. Electronically Signed   By: Charlett NoseKevin  Dover M.D.   On: 03/03/2017 10:01  Dg Chest Port 1 View  Result Date: 03/04/2017 CLINICAL DATA:  Post open heart surgery. EXAM: PORTABLE CHEST 1 VIEW COMPARISON:  03/03/2017 FINDINGS: The patient is intubated. Endotracheal tube terminates 6 cm above the carina. Enteric catheter descends into the abdomen and is collimated off the image. Mediastinal and chest tubes are in place. Right IJ approach Swan-Ganz catheter terminates in the expected location of main pulmonary trunk. Valvular prosthesis is seen. Left atrial appendage clamp is in place. Cardiomediastinal silhouette is normal. Mediastinal contours appear intact. Calcific atherosclerotic disease of the aorta. There is no evidence of focal airspace consolidation,  or pneumothorax. Small bilateral pleural effusions. Osseous structures are without acute abnormality. Soft tissues are grossly normal. IMPRESSION: Small bilateral pleural effusions. Support apparatus as described. Electronically Signed   By: Ted Mcalpine M.D.   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 fib with RVR Hypothyroidism Depression Tobacco abuse Plan Continue present management per CVTS   LOS: 5 days    Rinaldo Cloud 03/04/2017, 6:12 PM

## 2017-03-04 NOTE — Anesthesia Procedure Notes (Signed)
Central Venous Catheter Insertion Performed by: Murvin Natal, MD, anesthesiologist Start/End12/26/2018 7:10 AM, 03/04/2017 7:20 AM Patient location: Pre-op. Preanesthetic checklist: patient identified, IV checked, site marked, risks and benefits discussed, surgical consent, monitors and equipment checked, pre-op evaluation, timeout performed and anesthesia consent Position: Trendelenburg Lidocaine 1% used for infiltration and patient sedated Hand hygiene performed  and maximum sterile barriers used  Catheter size: 9 Fr Total catheter length 11. PA cath was placed.MAC introducer Swan type:thermodilution PA Cath depth:52 Procedure performed using ultrasound guided technique. Ultrasound Notes:anatomy identified, needle tip was noted to be adjacent to the nerve/plexus identified, no ultrasound evidence of intravascular and/or intraneural injection and image(s) printed for medical record Attempts: 1 Following insertion, line sutured, dressing applied and Biopatch. Post procedure assessment: blood return through all ports, free fluid flow and no air  Patient tolerated the procedure well with no immediate complications.

## 2017-03-04 NOTE — Progress Notes (Signed)
Family Medicine Teaching Service Daily Progress Note Intern Pager: [phone]  Patient name: Diane Nielsen Medical record number: [id number] Date of birth: [birth year] Age: 61 y.o. Gender: female  Primary Care Provider: Patient, No Pcp Per Consultants: cardiology, CVTS Code Status: Full  Chief Complaint: Shortness of breath  Assessment and Plan: Diane Nielsen is a 61 y.o. female presenting with shortness of breath found to have acute heart failure. PMH is significant for hypothyroidism, depression, arthritis, and tobacco abuse.  Acute decompensated heart failure with pulmonary edema: 2/2 to symptomatic severe mitral regurgitation. Earlier this hospital stay had episode of SVT w/HR 150s requiring amiodarone gtt due to soft BPs. Had right and left heart cath on 12/24 in preparation for valve surgery. -Cardiology following, appreciate recommendations. On amiodarone gtt.  -CVTS following for mitral regurg, plan for valve replacement today -dg orthopentagram wnl  -Strict I/O's, daily weights -heparin gtt for SVT  Hypothyroidism: Chronic, stable. TSH 12/22 WNL at 2.548.  -Continue home levothyroxine 100 mcg  Depression: Chronic. Spouse died 6 months ago, patient in grief counseling and reports coping well -Continue home Lexapro 20 mg qhs  Tobacco abuse: Smokes a little less than half a pack a day.   -continue nicotine patch 14 mg qd  FEN/GI: heart healthy diet Prophylaxis: heparin drip  Disposition: pending valve replacement  Subjective:  Doing well. Nervous about surgery today. No SOB and  Chest pain  Objective: Temp:  [97.8 F (36.6 C)-98.1 F (36.7 C)] 98.1 F (36.7 C) (12/26 0358) Pulse Rate:  [70-78] 70 (12/26 0558) Resp:  [18-19] 19 (12/26 0358) BP: (107-112)/(76-86) 107/86 (12/26 0558) SpO2:  [96 %-98 %] 98 % (12/26 0358) Weight:  [151 lb 14.4 oz (68.9 kg)] 151 lb 14.4 oz (68.9 kg) (12/26 0358) Physical Exam:  General: NAD, pleasant, laying in bed  comfortably Cardiovascular: regular rhythm, S1 and S2, 2/6 systolic murmur. no LE edema Respiratory: normal WOB on room air. CTAB Gastrointestinal: soft, nontender, nondistended, normoactive BS Extremities: no edema or cyanosis  Laboratory: Recent Labs  Lab 03/02/17 0550 03/03/17 0546 03/04/17 0454  WBC 5.8 6.2 6.0  HGB 12.4 12.3 12.9  HCT 37.8 37.2 39.3  PLT 132* 135* 141*   Recent Labs  Lab 02/28/17 0633 03/01/17 0506 03/02/17 0550 03/03/17 0546 03/04/17 0454  NA 138 137 138 137 137  K 3.7 3.4* 4.1 4.1 4.4  CL 101 101 103 104 104  CO2 27 28 27 27 26   BUN 13 19 16 10 12   CREATININE 0.86 1.02* 0.97 0.88 1.01*  CALCIUM 8.5* 8.4* 8.7* 8.4* 8.9  PROT 6.3* 6.1* 5.8*  --   --   BILITOT 0.9 0.8 0.7  --   --   ALKPHOS 69 65 59  --   --   ALT 77* 67* 51  --   --   AST 66* 47* 32  --   --   GLUCOSE 99 106* 95 100* 115*   Troponin: 0.04 > 0.03  Imaging/Diagnostic Tests:  Transthoracic Echocardiography  Study Date: 02/28/2017 Conclusions - Left ventricle: The cavity size was mildly dilated. Wall   thickness was normal. Systolic function was normal. The estimated   ejection fraction was in the range of 55% to 60%. - Mitral valve: MV apparatus is moderately thickened. Posterior   leaflet appears tethered There is also some restricted motion of   anterior leaflet. There is poor coaptation of leaflets in systole   with resultant severe MR - Left atrium: The atrium was massively dilated. -  Right atrium: The atrium was mildly dilated. - Tricuspid valve: There was moderate regurgitation.  Ct Angio Chest Pe W And/or Wo Contrast IMPRESSION: 1. Changes of acute congestive heart failure with mild cardiomegaly and bilateral pleural effusions. 2. No pulmonary emboli. 3. Bilateral lower lobe atelectasis. 4. Atheromatous calcifications, including the coronary arteries and aorta. Aortic Atherosclerosis (ICD10-I70.0). Electronically Signed   By: Beckie SaltsSteven  Reid M.D.   On: 02/27/2017 16:37    Dg Chest Portable 1 View   Result Date: 02/27/2017 IMPRESSION: CHF, with mild cardiomegaly and moderate to severe pulmonary edema. Electronically Signed   By: Hulan Saashomas  Lawrence M.D.   On: 02/27/2017 13:19   Berton BonMikell, Timesha Cervantez Zahra, MD 03/04/2017, 7:01 AM PGY-2, [facility] FPTS Intern pager: [phone], text pages welcome

## 2017-03-04 NOTE — Progress Notes (Signed)
Patient's ring removed and sent with nurse, Hiram Gash.  Patient requested that ring be placed in suitcase.

## 2017-03-04 NOTE — Transfer of Care (Signed)
Immediate Anesthesia Transfer of Care Note  Patient: Diane Nielsen  Procedure(s) Performed: MITRAL VALVE (MV) REPLACEMENT, MAZE PROCEDURE (N/A Chest) MAZE (N/A ) CLIPPING OF ATRIAL APPENDAGE (N/A Chest)  Patient Location: SICU  Anesthesia Type:General  Level of Consciousness: sedated and Patient remains intubated per anesthesia plan  Airway & Oxygen Therapy: Patient remains intubated per anesthesia plan and Patient placed on Ventilator (see vital sign flow sheet for setting)  Post-op Assessment: Report given to RN and Post -op Vital signs reviewed and stable  Post vital signs: Reviewed and stable  Last Vitals:  Vitals:   03/04/17 0358 03/04/17 0558  BP: 112/81 107/86  Pulse: 78 70  Resp: 19   Temp: 36.7 C   SpO2: 98%     Last Pain:  Vitals:   03/04/17 0512  TempSrc:   PainSc: 0-No pain         Complications: No apparent anesthesia complications

## 2017-03-04 NOTE — Progress Notes (Signed)
Patient had a ring that she forgot to send to security with her other belongings. Patient ring was removed in pre op area and  Patient requested  ring to be placed in her handbag/purse. Ring was placed in her purse witnessed by Mervyn Skeeters, RN and Secondary school teacher. When Patient is assigned to the room, belongings will be transferred.  Zhaniya Swallows, RN

## 2017-03-04 NOTE — Progress Notes (Signed)
Patient important personal belongings locked with security, rest of the belongings will be transferred when patients room is assigned.   Delsie Amador, RN

## 2017-03-05 ENCOUNTER — Encounter (HOSPITAL_COMMUNITY): Payer: Self-pay | Admitting: Cardiothoracic Surgery

## 2017-03-05 ENCOUNTER — Inpatient Hospital Stay (HOSPITAL_COMMUNITY): Payer: PRIVATE HEALTH INSURANCE

## 2017-03-05 LAB — CBC
HEMATOCRIT: 28.3 % — AB (ref 36.0–46.0)
HEMATOCRIT: 28.8 % — AB (ref 36.0–46.0)
HEMOGLOBIN: 9.7 g/dL — AB (ref 12.0–15.0)
Hemoglobin: 9.8 g/dL — ABNORMAL LOW (ref 12.0–15.0)
MCH: 31.4 pg (ref 26.0–34.0)
MCH: 31.2 pg (ref 26.0–34.0)
MCHC: 34.3 g/dL (ref 30.0–36.0)
MCHC: 34 g/dL (ref 30.0–36.0)
MCV: 91.6 fL (ref 78.0–100.0)
MCV: 91.7 fL (ref 78.0–100.0)
PLATELETS: 115 10*3/uL — AB (ref 150–400)
Platelets: 109 10*3/uL — ABNORMAL LOW (ref 150–400)
RBC: 3.09 MIL/uL — ABNORMAL LOW (ref 3.87–5.11)
RBC: 3.14 MIL/uL — AB (ref 3.87–5.11)
RDW: 15.5 % (ref 11.5–15.5)
RDW: 15.3 % (ref 11.5–15.5)
WBC: 10.5 10*3/uL (ref 4.0–10.5)
WBC: 13.1 10*3/uL — AB (ref 4.0–10.5)

## 2017-03-05 LAB — POCT I-STAT 3, ART BLOOD GAS (G3+)
Acid-base deficit: 4 mmol/L — ABNORMAL HIGH (ref 0.0–2.0)
Acid-base deficit: 3 mmol/L — ABNORMAL HIGH (ref 0.0–2.0)
Acid-base deficit: 3 mmol/L — ABNORMAL HIGH (ref 0.0–2.0)
Acid-base deficit: 1 mmol/L (ref 0.0–2.0)
Acid-base deficit: 2 mmol/L (ref 0.0–2.0)
BICARBONATE: 21.5 mmol/L (ref 20.0–28.0)
Bicarbonate: 21.2 mmol/L (ref 20.0–28.0)
Bicarbonate: 22.4 mmol/L (ref 20.0–28.0)
Bicarbonate: 23.7 mmol/L (ref 20.0–28.0)
Bicarbonate: 22.7 mmol/L (ref 20.0–28.0)
O2 SAT: 88 %
O2 SAT: 96 %
O2 SAT: 93 %
O2 SAT: 95 %
O2 Saturation: 95 %
PCO2 ART: 39 mmHg (ref 32.0–48.0)
PCO2 ART: 38.6 mmHg (ref 32.0–48.0)
PCO2 ART: 35.8 mmHg (ref 32.0–48.0)
PH ART: 7.401 (ref 7.350–7.450)
PH ART: 7.399 (ref 7.350–7.450)
PH ART: 7.41 (ref 7.350–7.450)
PO2 ART: 76 mmHg — AB (ref 83.0–108.0)
PO2 ART: 70 mmHg — AB (ref 83.0–108.0)
PO2 ART: 74 mmHg — AB (ref 83.0–108.0)
Patient temperature: 37.4
Patient temperature: 37.9
Patient temperature: 37.9
TCO2: 22 mmol/L (ref 22–32)
TCO2: 24 mmol/L (ref 22–32)
TCO2: 22 mmol/L (ref 22–32)
TCO2: 25 mmol/L (ref 22–32)
TCO2: 24 mmol/L (ref 22–32)
pCO2 arterial: 36.9 mmHg (ref 32.0–48.0)
pCO2 arterial: 34.9 mmHg (ref 32.0–48.0)
pH, Arterial: 7.369 (ref 7.350–7.450)
pH, Arterial: 7.368 (ref 7.350–7.450)
pO2, Arterial: 56 mmHg — ABNORMAL LOW (ref 83.0–108.0)
pO2, Arterial: 87 mmHg (ref 83.0–108.0)

## 2017-03-05 LAB — BPAM FFP
Blood Product Expiration Date: 201812262359
Blood Product Expiration Date: 201812282359
Blood Product Expiration Date: 201812282359
ISSUE DATE / TIME: 201812261134
ISSUE DATE / TIME: 201812261134
ISSUE DATE / TIME: 201812230133
Unit Type and Rh: 7300
Unit Type and Rh: 7300
Unit Type and Rh: 7300

## 2017-03-05 LAB — PREPARE FRESH FROZEN PLASMA
Unit division: 0
Unit division: 0
Unit division: 0

## 2017-03-05 LAB — POCT I-STAT, CHEM 8
BUN: 9 mg/dL (ref 6–20)
CALCIUM ION: 1.13 mmol/L — AB (ref 1.15–1.40)
CHLORIDE: 99 mmol/L — AB (ref 101–111)
Creatinine, Ser: 0.7 mg/dL (ref 0.44–1.00)
Glucose, Bld: 167 mg/dL — ABNORMAL HIGH (ref 65–99)
HCT: 30 % — ABNORMAL LOW (ref 36.0–46.0)
Hemoglobin: 10.2 g/dL — ABNORMAL LOW (ref 12.0–15.0)
POTASSIUM: 3.8 mmol/L (ref 3.5–5.1)
SODIUM: 137 mmol/L (ref 135–145)
TCO2: 23 mmol/L (ref 22–32)

## 2017-03-05 LAB — BASIC METABOLIC PANEL
Anion gap: 7 (ref 5–15)
BUN: 8 mg/dL (ref 6–20)
CHLORIDE: 108 mmol/L (ref 101–111)
CO2: 24 mmol/L (ref 22–32)
CREATININE: 0.65 mg/dL (ref 0.44–1.00)
Calcium: 8 mg/dL — ABNORMAL LOW (ref 8.9–10.3)
GFR calc Af Amer: >60 mL/min (ref >60)
GFR calc non Af Amer: >60 mL/min (ref >60)
GLUCOSE: 134 mg/dL — AB (ref 65–99)
Potassium: 4 mmol/L (ref 3.5–5.1)
SODIUM: 139 mmol/L (ref 135–145)

## 2017-03-05 LAB — COOXEMETRY PANEL
Carboxyhemoglobin: 0.9 % (ref 0.5–1.5)
Methemoglobin: 1.4 % (ref 0.0–1.5)
O2 Saturation: 75.2 %
Total hemoglobin: 9.8 g/dL — ABNORMAL LOW (ref 12.0–16.0)

## 2017-03-05 LAB — CREATININE, SERUM: Creatinine, Ser: 0.81 mg/dL (ref 0.44–1.00)

## 2017-03-05 LAB — PREPARE PLATELET PHERESIS: UNIT DIVISION: 0

## 2017-03-05 LAB — BPAM PLATELET PHERESIS
Blood Product Expiration Date: 201812271123
ISSUE DATE / TIME: 201812261134
Unit Type and Rh: 7300

## 2017-03-05 LAB — MAGNESIUM
MAGNESIUM: 2.3 mg/dL (ref 1.7–2.4)
Magnesium: 2.6 mg/dL — ABNORMAL HIGH (ref 1.7–2.4)

## 2017-03-05 LAB — GLUCOSE, CAPILLARY
GLUCOSE-CAPILLARY: 139 mg/dL — AB (ref 65–99)
GLUCOSE-CAPILLARY: 128 mg/dL — AB (ref 65–99)
GLUCOSE-CAPILLARY: 158 mg/dL — AB (ref 65–99)
Glucose-Capillary: 151 mg/dL — ABNORMAL HIGH (ref 65–99)
Glucose-Capillary: 159 mg/dL — ABNORMAL HIGH (ref 65–99)
Glucose-Capillary: 198 mg/dL — ABNORMAL HIGH (ref 65–99)

## 2017-03-05 MED ORDER — ORAL CARE MOUTH RINSE
15.0000 mL | Freq: Two times a day (BID) | OROMUCOSAL | Status: DC
  Administered 2017-03-05 – 2017-03-10 (×9): 15 mL via OROMUCOSAL

## 2017-03-05 MED ORDER — VANCOMYCIN HCL IN DEXTROSE 1-5 GM/200ML-% IV SOLN
1000.0000 mg | Freq: Two times a day (BID) | INTRAVENOUS | Status: AC
Start: 2017-03-05 — End: 2017-03-06
  Administered 2017-03-05 – 2017-03-06 (×2): 1000 mg via INTRAVENOUS
  Filled 2017-03-05 (×2): qty 200

## 2017-03-05 MED ORDER — POTASSIUM CHLORIDE 10 MEQ/50ML IV SOLN
10.0000 meq | INTRAVENOUS | Status: AC
  Administered 2017-03-05 (×2): 10 meq via INTRAVENOUS
  Filled 2017-03-05 (×2): qty 50

## 2017-03-05 MED ORDER — FUROSEMIDE 10 MG/ML IJ SOLN
20.0000 mg | Freq: Once | INTRAMUSCULAR | Status: AC
  Administered 2017-03-05: 20 mg via INTRAVENOUS
  Filled 2017-03-05: qty 2

## 2017-03-05 MED ORDER — FUROSEMIDE 10 MG/ML IJ SOLN
20.0000 mg | Freq: Two times a day (BID) | INTRAMUSCULAR | Status: DC
  Administered 2017-03-05 (×2): 20 mg via INTRAVENOUS
  Filled 2017-03-05 (×2): qty 2

## 2017-03-05 MED ORDER — INSULIN ASPART 100 UNIT/ML ~~LOC~~ SOLN
0.0000 [IU] | SUBCUTANEOUS | Status: DC
  Administered 2017-03-05: 4 [IU] via SUBCUTANEOUS
  Administered 2017-03-05 – 2017-03-06 (×3): 2 [IU] via SUBCUTANEOUS

## 2017-03-05 MED ORDER — METHYLPREDNISOLONE SODIUM SUCC 125 MG IJ SOLR
80.0000 mg | Freq: Once | INTRAMUSCULAR | Status: AC
  Administered 2017-03-05: 80 mg via INTRAVENOUS
  Filled 2017-03-05: qty 2

## 2017-03-05 MED FILL — Lidocaine HCl IV Inj 20 MG/ML: INTRAVENOUS | Qty: 5 | Status: AC

## 2017-03-05 MED FILL — Heparin Sodium (Porcine) Inj 1000 Unit/ML: INTRAMUSCULAR | Qty: 2500 | Status: AC

## 2017-03-05 MED FILL — Sodium Chloride IV Soln 0.9%: INTRAVENOUS | Qty: 2000 | Status: AC

## 2017-03-05 MED FILL — Heparin Sodium (Porcine) Inj 1000 Unit/ML: INTRAMUSCULAR | Qty: 30 | Status: AC

## 2017-03-05 MED FILL — Electrolyte-R (PH 7.4) Solution: INTRAVENOUS | Qty: 3000 | Status: AC

## 2017-03-05 MED FILL — Sodium Bicarbonate IV Soln 8.4%: INTRAVENOUS | Qty: 50 | Status: AC

## 2017-03-05 MED FILL — Magnesium Sulfate Inj 50%: INTRAMUSCULAR | Qty: 10 | Status: AC

## 2017-03-05 MED FILL — Albumin, Human Inj 5%: INTRAVENOUS | Qty: 250 | Status: AC

## 2017-03-05 MED FILL — Potassium Chloride Inj 2 mEq/ML: INTRAVENOUS | Qty: 40 | Status: AC

## 2017-03-05 MED FILL — Mannitol IV Soln 20%: INTRAVENOUS | Qty: 500 | Status: AC

## 2017-03-05 NOTE — Progress Notes (Addendum)
Pt. Placed back on full support. Pt. Performed her parameters with good effort (VC 800L, NIF -30) but the ABG resulted a low P02 of 55 which does not meet protocol standards for extubation. The MD was notified by the RN and stated not to extubate pt. Until Dr. Donata Clay sees the pt. RT and RN will continue to monitor pt.

## 2017-03-05 NOTE — Progress Notes (Signed)
Wean protocol initiated. 

## 2017-03-05 NOTE — Care Management Note (Signed)
Case Management Note Donn Pierini RN, BSN Unit 4E-Case Manager-- 2H coverage [phone]  Patient Details  Name: Diane Nielsen MRN: [id number] Date of Birth: 04/06/1955  Subjective/Objective:  Pt admitted Acute decompensated heart failure with pulmonary edema: 2/2 to symptomatic severe mitral regurgitation-- now s/p MVR and MAZE on 03/04/17                 Action/Plan: PTA pt lived at home- anticipate return home- CM to follow pt progress post op.   Expected Discharge Date:  03/09/17               Expected Discharge Plan:     In-House Referral:     Discharge planning Services  CM Consult  Post Acute Care Choice:    Choice offered to:     DME Arranged:    DME Agency:     HH Arranged:    HH Agency:     Status of Service:  In process, will continue to follow  If discussed at Long Length of Stay Meetings, dates discussed:   Discharge Disposition:    Additional Comments:  [name], RN 03/05/2017, 11:50 AM

## 2017-03-05 NOTE — Procedures (Signed)
Extubation Procedure Note  Patient Details:   Name: Diane Nielsen DOB: 08-Jun-1955 MRN: [id number]   Airway Documentation:     Evaluation  O2 sats: stable throughout Complications: No apparent complications Patient did tolerate procedure well. Bilateral Breath Sounds: Clear   Yes   MD ordered to extubate. Patient flipped to wean to monitor while setting up for extubation. Patient very anxious. Positive for cuff leak. Patient extubated to a 6 Lpm nasal cannula. No signs of dyspnea or stridor noted. Patient instructed on the Incentive Spirometer achieving 500 mL three times, with 600 mL at best, once. Patient resting comfortably. RN at bedside.   Ancil Boozer 03/05/2017, 9:25 AM

## 2017-03-05 NOTE — Progress Notes (Signed)
1 Day Post-Op Procedure(s) (LRB): MITRAL VALVE (MV) REPLACEMENT, MAZE PROCEDURE (N/A) MAZE (N/A) CLIPPING OF ATRIAL APPENDAGE (N/A) Subjective: Awake on vent Low PO2 prevented extubation Junctional HR 60, A-V paced Objective: Vital signs in last 24 hours: Temp:  [95.2 F (35.1 C)-99.7 F (37.6 C)] 99.5 F (37.5 C) (12/27 0715) Pulse Rate:  [80-90] 88 (12/27 0715) Cardiac Rhythm: A-V Sequential paced (12/27 0400) Resp:  [12-29] 16 (12/27 0715) BP: (88-137)/(51-84) 120/77 (12/27 0700) SpO2:  [88 %-99 %] 96 % (12/27 0725) Arterial Line BP: (85-148)/(42-69) 148/66 (12/27 0715) FiO2 (%):  [40 %-80 %] 50 % (12/27 0725) Weight:  [170 lb 6.7 oz (77.3 kg)] 170 lb 6.7 oz (77.3 kg) (12/27 0600)  Hemodynamic parameters for last 24 hours: PAP: (19-35)/(8-17) 27/11 CO:  [3.1 L/min-5.1 L/min] 3.5 L/min CI:  [1.7 L/min/m2-2.9 L/min/m2] 2 L/min/m2  Intake/Output from previous day: 12/26 0701 - 12/27 0700 In: 7292.4 [I.V.:3669.4; Blood:1773; IV Piggyback:1850] Out: 4425 [Urine:3475; Blood:600; Chest Tube:350] Intake/Output this shift: Total I/O In: -  Out: 1280 [Urine:1250; Chest Tube:30]       Exam    General- alert and comfortable   Lungs- clear without rales, wheezes   Cor- regular rate and rhythm, no murmur , gallop   Abdomen- soft, non-tender   Extremities - warm, non-tender, mild edema L arm   Neuro- oriented, appropriate, no focal weakness   Lab Results: Recent Labs    03/04/17 1849 03/04/17 1854 03/05/17 0435  WBC 11.0*  --  10.5  HGB 10.4* 9.9* 9.7*  HCT 30.7* 29.0* 28.3*  PLT 121*  --  109*   BMET:  Recent Labs    03/04/17 0454  03/04/17 1854 03/05/17 0435  NA 137   < > 142 139  K 4.4   < > 4.7 4.0  CL 104   < > 106 108  CO2 26  --   --  24  GLUCOSE 115*   < > 126* 134*  BUN 12   < > 11 8  CREATININE 1.01*   < > 0.70 0.65  CALCIUM 8.9  --   --  8.0*   < > = values in this interval not displayed.    PT/INR:  Recent Labs    03/04/17 1329  LABPROT  17.0*  INR 1.39   ABG    Component Value Date/Time   PHART 7.368 03/05/2017 0508   HCO3 22.4 03/05/2017 0508   TCO2 24 03/05/2017 0508   ACIDBASEDEF 3.0 (H) 03/05/2017 0508   O2SAT 75.2 03/05/2017 0518   CBG (last 3)  Recent Labs    03/05/17 0020 03/05/17 0506 03/05/17 0806  GLUCAP 159* 128* 158*    Assessment/Plan: S/P Procedure(s) (LRB): MITRAL VALVE (MV) REPLACEMENT, MAZE PROCEDURE (N/A) MAZE (N/A) CLIPPING OF ATRIAL APPENDAGE (N/A) Mobilize Diuresis Diabetes control coumadin tomorrow  Hold amiodarone, lopressor for low HR   LOS: 6 days    Diane Nielsen 03/05/2017

## 2017-03-05 NOTE — Progress Notes (Signed)
FPTS Interim Progress Note  S:Patient sitting up in bed on room air, asking for ice chips and reporting she in uncomfortable sitting in the bed all the time. Reports she is happy that her heart may be fixed now. Denies chest pain and shortness of breath.   O: BP 109/90 (BP Location: Right Arm)   Pulse 88   Temp 99.7 F (37.6 C) (Core)   Resp 18   Ht 5\' 5"  (1.651 m)   Wt 170 lb 6.7 oz (77.3 kg)   SpO2 95%   BMI 28.36 kg/m   General: Pleasant, NAD Cardiac: RRR, normal S1, S2 Respiratory: CTABL, on room air, normal work of breathing Psych: alert and oriented with normal affect  A/P: Appreciate Critical Care Team and their excellent care and management of this patient. Will continue to follow.   Lonzo Saulter, [place], DO 03/05/2017, 8:50 AM PGY-1, [facility] Family Medicine Service pager [phone]

## 2017-03-05 NOTE — Progress Notes (Signed)
Subjective:  Successfully extubated earlier today denies any chest pain or shortness of breath. Paced rhythm on the monitor.  Objective:  Vital Signs in the last 24 hours: Temp:  [95.2 F (35.1 C)-100.2 F (37.9 C)] 100.2 F (37.9 C) (12/27 0912) Pulse Rate:  [79-90] 79 (12/27 0912) Resp:  [12-29] 23 (12/27 0912) BP: (88-137)/(51-90) 112/69 (12/27 0900) SpO2:  [88 %-99 %] 94 % (12/27 0912) Arterial Line BP: (85-148)/(42-69) 116/57 (12/27 0900) FiO2 (%):  [40 %-80 %] 50 % (12/27 0908) Weight:  [77.3 kg (170 lb 6.7 oz)] 77.3 kg (170 lb 6.7 oz) (12/27 0600)  Intake/Output from previous day: 12/26 0701 - 12/27 0700 In: 7292.4 [I.V.:3669.4; Blood:1773; IV Piggyback:1850] Out: 4425 [Urine:3475; Blood:600; Chest Tube:350] Intake/Output from this shift: Total I/O In: 235.6 [I.V.:135.6; IV Piggyback:100] Out: 1355 [Urine:1325; Chest Tube:30]  Physical Exam: Neck: no adenopathy, no carotid bruit, no JVD and supple, symmetrical, trachea midline Lungs: Decreased breath sound at bases clear anteriorly Heart: regular rate and rhythm and S1, S2 normal Abdomen: soft, non-tender; bowel sounds normal; no masses,  no organomegaly Extremities: extremities normal, atraumatic, no cyanosis or edema  Lab Results: Recent Labs    03/04/17 1849 03/04/17 1854 03/05/17 0435  WBC 11.0*  --  10.5  HGB 10.4* 9.9* 9.7*  PLT 121*  --  109*   Recent Labs    03/04/17 0454  03/04/17 1854 03/05/17 0435  NA 137   < > 142 139  K 4.4   < > 4.7 4.0  CL 104   < > 106 108  CO2 26  --   --  24  GLUCOSE 115*   < > 126* 134*  BUN 12   < > 11 8  CREATININE 1.01*   < > 0.70 0.65   < > = values in this interval not displayed.   No results for input(s): TROPONINI in the last 72 hours.  Invalid input(s): CK, MB Hepatic Function Panel No results for input(s): PROT, ALBUMIN, AST, ALT, ALKPHOS, BILITOT, BILIDIR, IBILI in the last 72 hours. No results for input(s): CHOL in the last 72 hours. No results for  input(s): PROTIME in the last 72 hours.  Imaging: Imaging results have been reviewed and Dg Chest Port 1 View  Result Date: 03/05/2017 CLINICAL DATA:  Status post mitral valve replacement EXAM: PORTABLE CHEST 1 VIEW COMPARISON:  03/04/2017 FINDINGS: Cardiac shadow is stable. Postsurgical changes are again seen. Swan-Ganz catheter, endotracheal tube, nasogastric catheter and right thoracostomy catheter are again identified. Mediastinal drain is present as well. Left atrial clip is again noted. No pneumothorax is seen. Mild left retrocardiac atelectasis is noted slightly increased from the prior exam. Small left pleural effusion is noted. No acute bony abnormality is seen. IMPRESSION: Postoperative change with tubes and lines as described above. Slight increase in left retrocardiac atelectasis is noted. Electronically Signed   By: Alcide CleverMark  Lukens M.D.   On: 03/05/2017 07:48   Dg Chest Port 1 View  Result Date: 03/04/2017 CLINICAL DATA:  Post open heart surgery. EXAM: PORTABLE CHEST 1 VIEW COMPARISON:  03/03/2017 FINDINGS: The patient is intubated. Endotracheal tube terminates 6 cm above the carina. Enteric catheter descends into the abdomen and is collimated off the image. Mediastinal and chest tubes are in place. Right IJ approach Swan-Ganz catheter terminates in the expected location of main pulmonary trunk. Valvular prosthesis is seen. Left atrial appendage clamp is in place. Cardiomediastinal silhouette is normal. Mediastinal contours appear intact. Calcific atherosclerotic disease of the aorta. There  is no evidence of focal airspace consolidation, or pneumothorax. Small bilateral pleural effusions. Osseous structures are without acute abnormality. Soft tissues are grossly normal. IMPRESSION: Small bilateral pleural effusions. Support apparatus as described. Electronically Signed   By: Ted Mcalpine M.D.   On: 03/04/2017 14:01    Cardiac Studies:  Assessment/Plan:  Severe symptomatic mitral  regurgitation with myxomatous changes in the mitral valvestatus post left and right cardiac catheterization Status post MVR/Maze procedure/clipping of left atrial appendage doing well postop day 1 Moderate pulmonary hypertension Status post paroxysmal A. fib with RVR Hypothyroidism Depression Tobacco abuse Postop anemia Postop atelectasis Mild volume overload Plan Continue present management per CVTS Patient encouraged regarding incentive spirometry.   LOS: 6 days    Diane Nielsen 03/05/2017, 10:00 AM

## 2017-03-05 NOTE — Op Note (Signed)
NAME:  Diane Nielsen, Diane Nielsen                   ACCOUNT NO.:  MEDICAL RECORD NO.:  [id number]  LOCATION:                                 FACILITY:  PHYSICIAN:  Kerin Perna, M.D.  DATE OF BIRTH:  1955-08-20  DATE OF PROCEDURE:  03/04/2017 DATE OF DISCHARGE:                              OPERATIVE REPORT   OPERATION: 1. Sternotomy for mitral valve replacement (29 mm pericardial [facility] Ease valve serial [id number]). 2. Biatrial maze procedure. 3. Application of left atrial 35 mm clip.  SURGEON:  Kerin Perna, MD.  ASSISTANTDeniece Portela E gold, P.A.-C.  ANESTHESIA:  General.  PREOPERATIVE DIAGNOSES: 1. 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 2. Rapid atrial fibrillation of new onset. 3. Chronic obstructive pulmonary disease with active smoking.  POSTOPERATIVE DIAGNOSES: 1. 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 2. Rapid atrial fibrillation of new onset. 3. Chronic obstructive pulmonary disease with active smoking.  CLINICAL NOTE:  The patient is a 61 year old active smoker, who was admitted through the emergency department by Dr. Sharyn Lull for acute respiratory distress, pulmonary edema, and class 4 CHF.  Echocardiogram demonstrated severe mitral regurgitation, AI, mild-moderate TR, and mild LV dysfunction with LV dilatation and left atrial dilatation.  The patient was in rapid atrial fibrillation and this was chemically converted to sinus rhythm with amiodarone.  Her cardiac enzymes were negative.  She underwent right and left heart catheterization by Dr. Sharyn Lull, which showed no significant CAD with moderately elevated right- sided pressures.  PA pressure is 45/20.  She was recommended for mitral valve repair-replacement and maze procedure.  Her preoperative Panorex dental x-ray showed no significant dental disease.  I  discussed the procedure of mitral valve repair-replacement and maze procedure with the patient including the indications, benefits, alternatives, and expected postoperative recovery.  I discussed the potential risks of surgery including the risks of bleeding, blood transfusion requirement, stroke, recurrent atrial fibrillation, postoperative pulmonary problems including pleural effusion and prolonged ventilator requirement because of her COPD, postoperative infection, postoperative organ failure, and death.  After reviewing these issues, she demonstrated her understanding and agreed to proceed with surgery under what I felt was an informed consent.  FINDINGS: 1. Severely thickened posterior leaflet of the mitral valve with     retracted subchordal apparatus.  Mild-to-moderate thickening of the     anterior leaflet with retracted subchordal apparatus - probable     rheumatic etiology. 2. Successful chordal sparing mitral valve replacement using a 29 mm     pericardial valve. 3. Biatrial maze procedure using RF ablation lines around the left     atrial pulmonary veins, a box lesion set of the left atrium, and     right atrial ablation lines.  DESCRIPTION OF PROCEDURE:  The patient was brought to the operating room and placed supine on the operating room table, where general anesthesia was induced under invasive hemodynamic monitoring.  A transesophageal echo probe was placed by the anesthesia team, which confirmed the preoperative diagnosis.  The patient  was prepped and draped as a sterile field.  A proper time-out was performed.  A sternal incision was made. There was bleeding from the innominate artery, which was very high and anterior and this was repaired surgically.  The sternal retractor was placed and pericardium was opened and suspended.  Pursestrings were placed in the ascending aorta and superior vena cava and heparin was administered.  The patient was then cannulated and  placed on cardiopulmonary bypass.  A second pursestring was placed in the low right atrium for bicaval drainage.  Caval tapes were placed. The interatrial groove was dissected.  Cardioplegia cannulas were placed both antegrade and retrograde cold blood cardioplegia.  The patient was cooled to 32 degrees and the aortic crossclamp was applied.  A liter of cold blood cardioplegia was delivered in split doses between the antegrade aortic and retrograde coronary sinus catheters.  There was good cardioplegic arrest and septal temperature dropped less than 12 degrees.  Cardioplegia was delivered every 20 minutes.  The heart was elevated and the left-sided pulmonary veins were identified.  The ligament connecting the superior left-sided pulmonary vein to the pericardium was dissected and a vessel loop was placed around the left-sided pulmonary veins.  The bipolar ablation clamp was then placed and 2 ablation lines were placed around the left atrial, left-sided pulmonary vein cuff.  Ablation lines were then placed at the base of the atrial appendage.  A 35 mm atrial clip was then placed to occlude the atrial appendage. The heart was then positioned back in the pericardium.  A left atriotomy was performed and the atrial elevating retractors were placed.  There was good exposure of the mitral valve.  The bipolar radiofrequency ablation clamp was used to place ablation lines around the posterior aspect of the right-sided atrial veins.  Two ablation lines were set.  A box set lesion of the floor of the atrium was then performed using the bipolar clamp from the in atrial incision to the P3 area of the mitral valve and then across from the upper portion of the atrial incision to the ablation line around the left-sided pulmonary veins.  This completed the left atrial maze procedure.  The mitral valve was inspected.  The leaflet was extremely thickened and calcified of the posterior leaflet and  moderately thickened than the anterior leaflet.  The leaflet was not adequate for repair.  The anterior leaflet was partially excised and the posterior leaflet was left intact.  Fourteen supraannular pledgeted 2-0 Ethibond sutures were then placed around the circumference of the valve.  The annulus was sized to a 29 mm Magna Ease valve.  The valve was prepared according to the protocol.  The sutures were placed through the sewing ring and the valve was seated and sutures were tied.  The valve was tested and had good competence and conformed to the annulus well.  Insufflated CO2 was applied to the surgical field during the procedure.  The retractors were removed and the atriotomy was closed in 2 layers using running 3-0 Prolene.  Cardioplegia was redosed.  A right atriotomy was performed as the caval tapes were tightened. Through the atriotomy, the bipolar clamp was used to create ablation lines superiorly and inferiorly on the lateral wall toward the SVC and IVC.  The atriotomy then was closed with running 4-0 Prolene.  The patient was placed in deep Trendelenburg and using the retrograde warm blood cardioplegia in the usual de-airing maneuvers, air was removed from the left side of the heart  and the crossclamp was removed.  The heart resumed a spontaneous rhythm.  The surgical incisions were hemostatic.  Temporary pacing wires were applied.  The patient was rewarmed and reperfused.  The lungs were expanded and temporary pacing wires were applied.  Low-dose Milrinone and dopamine were started.  When the patient was adequately reperfused and warmed, the patient was weaned from cardiopulmonary bypass without difficulty.  The mitral valve functioned well.  LV function appeared to be improved.  There was no significant TR.  Protamine was administered without adverse reaction. The cannulas were removed.  The patient had a platelet count of 60,000 and was given 1 unit of platelets with  improved coagulation function. Mediastinum was irrigated.  The superior pericardial fat was closed over the aorta.  Anterior mediastinal and right pleural tubes were placed and brought out through separate incisions.  The sternum was closed with wire.  The patient remained stable.  The pectoralis fascia and subcutaneous layers were closed running Vicryl.  The skin was closed with a subcuticular suture.  Total cardiopulmonary bypass time was 140 minutes.     Kerin PernaPeter Van Trigt, M.D.     PV/MEDQ  D:  03/04/2017  T:  03/04/2017  Job:  [id number]  cc:   Eduardo OsierMohan N. Sharyn LullHarwani, M.D.

## 2017-03-05 NOTE — Progress Notes (Signed)
Dr. Tyrone Sage notified of pt's ABG during Rapid Wean Protocol: pH 7.36, CO2 36.9, PO2 55, Bicarb 21.2  Verbal order received to place pt back on full vent support until Dr. Donata Clay sees pt and CXR during morning rounds. Will implement and continue to monitor pt closely.

## 2017-03-05 NOTE — Progress Notes (Signed)
CT surgery p.m. Rounds  Patient up in chair today for several hours Excavated earlier this morning postop day 1 Back in sinus rhythm with good hemodynamics, Neo-Synephrine weaned off Good urine output with Lasix diuresis P.m. labs reviewed and are satisfactory Left arm edematous following innominate vein repair

## 2017-03-06 ENCOUNTER — Inpatient Hospital Stay (HOSPITAL_COMMUNITY): Payer: PRIVATE HEALTH INSURANCE

## 2017-03-06 LAB — POCT I-STAT, CHEM 8
BUN: 10 mg/dL (ref 6–20)
Calcium, Ion: 1.09 mmol/L — ABNORMAL LOW (ref 1.15–1.40)
Chloride: 92 mmol/L — ABNORMAL LOW (ref 101–111)
Creatinine, Ser: 0.7 mg/dL (ref 0.44–1.00)
Glucose, Bld: 136 mg/dL — ABNORMAL HIGH (ref 65–99)
HCT: 29 % — ABNORMAL LOW (ref 36.0–46.0)
Hemoglobin: 9.9 g/dL — ABNORMAL LOW (ref 12.0–15.0)
Potassium: 4.3 mmol/L (ref 3.5–5.1)
Sodium: 130 mmol/L — ABNORMAL LOW (ref 135–145)
TCO2: 25 mmol/L (ref 22–32)

## 2017-03-06 LAB — BPAM RBC
Blood Product Expiration Date: 201901172359
Blood Product Expiration Date: 201901172359
Blood Product Expiration Date: 201901182359
Blood Product Expiration Date: 201901182359
Blood Product Expiration Date: 201901182359
ISSUE DATE / TIME: 201812220743
ISSUE DATE / TIME: 201812260848
ISSUE DATE / TIME: 201812260848
ISSUE DATE / TIME: 201812260848
Unit Type and Rh: 5100
Unit Type and Rh: 5100
Unit Type and Rh: 5100
Unit Type and Rh: 5100
Unit Type and Rh: 5100

## 2017-03-06 LAB — TYPE AND SCREEN
ABO/RH(D): O POS
Antibody Screen: NEGATIVE
Unit division: 0
Unit division: 0
Unit division: 0
Unit division: 0
Unit division: 0

## 2017-03-06 LAB — BASIC METABOLIC PANEL
Anion gap: 7 (ref 5–15)
BUN: 8 mg/dL (ref 6–20)
CO2: 25 mmol/L (ref 22–32)
Calcium: 8.4 mg/dL — ABNORMAL LOW (ref 8.9–10.3)
Chloride: 98 mmol/L — ABNORMAL LOW (ref 101–111)
Creatinine, Ser: 0.65 mg/dL (ref 0.44–1.00)
GFR calc Af Amer: >60 mL/min (ref >60)
GFR calc non Af Amer: >60 mL/min (ref >60)
Glucose, Bld: 118 mg/dL — ABNORMAL HIGH (ref 65–99)
Potassium: 3.6 mmol/L (ref 3.5–5.1)
Sodium: 130 mmol/L — ABNORMAL LOW (ref 135–145)

## 2017-03-06 LAB — GLUCOSE, CAPILLARY
GLUCOSE-CAPILLARY: 112 mg/dL — AB (ref 65–99)
GLUCOSE-CAPILLARY: 117 mg/dL — AB (ref 65–99)
GLUCOSE-CAPILLARY: 135 mg/dL — AB (ref 65–99)
GLUCOSE-CAPILLARY: 139 mg/dL — AB (ref 65–99)
Glucose-Capillary: 105 mg/dL — ABNORMAL HIGH (ref 65–99)
Glucose-Capillary: 118 mg/dL — ABNORMAL HIGH (ref 65–99)

## 2017-03-06 LAB — PROTIME-INR
INR: 0.98
PROTHROMBIN TIME: 12.9 s (ref 11.4–15.2)

## 2017-03-06 LAB — CBC
HCT: 27.6 % — ABNORMAL LOW (ref 36.0–46.0)
Hemoglobin: 9.4 g/dL — ABNORMAL LOW (ref 12.0–15.0)
MCH: 31.5 pg (ref 26.0–34.0)
MCHC: 34.1 g/dL (ref 30.0–36.0)
MCV: 92.6 fL (ref 78.0–100.0)
Platelets: 94 10*3/uL — ABNORMAL LOW (ref 150–400)
RBC: 2.98 MIL/uL — ABNORMAL LOW (ref 3.87–5.11)
RDW: 14.9 % (ref 11.5–15.5)
WBC: 10.8 10*3/uL — ABNORMAL HIGH (ref 4.0–10.5)

## 2017-03-06 MED ORDER — LOSARTAN POTASSIUM 50 MG PO TABS: 50.0000 mg | ORAL_TABLET | Freq: Every day | ORAL | Status: DC

## 2017-03-06 MED ORDER — WARFARIN SODIUM 5 MG PO TABS
5.0000 mg | ORAL_TABLET | Freq: Every day | ORAL | Status: DC
  Administered 2017-03-06 – 2017-03-07 (×2): 5 mg via ORAL
  Filled 2017-03-06 (×2): qty 1

## 2017-03-06 MED ORDER — LEVALBUTEROL HCL 1.25 MG/0.5ML IN NEBU
1.2500 mg | INHALATION_SOLUTION | Freq: Three times a day (TID) | RESPIRATORY_TRACT | Status: DC
  Administered 2017-03-06 – 2017-03-07 (×5): 1.25 mg via RESPIRATORY_TRACT
  Filled 2017-03-06 (×5): qty 0.5

## 2017-03-06 MED ORDER — ESCITALOPRAM OXALATE 20 MG PO TABS
20.0000 mg | ORAL_TABLET | Freq: Every day | ORAL | Status: DC
  Administered 2017-03-06 – 2017-03-11 (×6): 20 mg via ORAL
  Filled 2017-03-06: qty 1
  Filled 2017-03-06: qty 2
  Filled 2017-03-06: qty 1
  Filled 2017-03-06: qty 2
  Filled 2017-03-06 (×2): qty 1

## 2017-03-06 MED ORDER — FUROSEMIDE 40 MG PO TABS
40.0000 mg | ORAL_TABLET | Freq: Every day | ORAL | Status: DC
  Administered 2017-03-07 – 2017-03-11 (×5): 40 mg via ORAL
  Filled 2017-03-06 (×5): qty 1

## 2017-03-06 MED ORDER — POTASSIUM CHLORIDE CRYS ER 10 MEQ PO TBCR
20.0000 meq | EXTENDED_RELEASE_TABLET | Freq: Every day | ORAL | Status: DC
  Administered 2017-03-06 – 2017-03-11 (×6): 20 meq via ORAL
  Filled 2017-03-06 (×7): qty 2

## 2017-03-06 MED ORDER — ENOXAPARIN SODIUM 40 MG/0.4ML ~~LOC~~ SOLN
40.0000 mg | SUBCUTANEOUS | Status: DC
  Administered 2017-03-06 – 2017-03-07 (×2): 40 mg via SUBCUTANEOUS
  Filled 2017-03-06 (×2): qty 0.4

## 2017-03-06 MED ORDER — ASPIRIN EC 81 MG PO TBEC
81.0000 mg | DELAYED_RELEASE_TABLET | Freq: Every day | ORAL | Status: DC
  Administered 2017-03-06 – 2017-03-07 (×2): 81 mg via ORAL
  Filled 2017-03-06 (×2): qty 1

## 2017-03-06 MED ORDER — FUROSEMIDE 10 MG/ML IJ SOLN
20.0000 mg | Freq: Two times a day (BID) | INTRAMUSCULAR | Status: AC
  Administered 2017-03-06 (×2): 20 mg via INTRAVENOUS
  Filled 2017-03-06 (×2): qty 2

## 2017-03-06 MED ORDER — POTASSIUM CHLORIDE 10 MEQ/50ML IV SOLN
10.0000 meq | INTRAVENOUS | Status: AC
  Administered 2017-03-06 (×3): 10 meq via INTRAVENOUS
  Filled 2017-03-06 (×3): qty 50

## 2017-03-06 MED ORDER — LEVOTHYROXINE SODIUM 100 MCG PO TABS
100.0000 ug | ORAL_TABLET | Freq: Every day | ORAL | Status: DC
Start: 2017-03-06 — End: 2017-03-11
  Administered 2017-03-06 – 2017-03-11 (×6): 100 ug via ORAL
  Filled 2017-03-06 (×6): qty 1

## 2017-03-06 MED ORDER — WARFARIN - PHYSICIAN DOSING INPATIENT
Freq: Every day | Status: DC
  Administered 2017-03-06 – 2017-03-10 (×5)

## 2017-03-06 NOTE — Progress Notes (Signed)
FPTS Interim Progress Note  S:Patient sitting up in chair and I eager to be able to walk. She denies chest pain. Reports some SOB but says it is positional only. She feels much better.   O: BP (!) 104/57 (BP Location: Right Arm)   Pulse 68   Temp 98.7 F (37.1 C) (Oral)   Resp (!) 24   Ht 5\' 5"  (1.651 m)   Wt 164 lb 11.2 oz (74.7 kg)   SpO2 96%   BMI 27.41 kg/m   General: Pleasant, NAD Cardiac: RRR, normal S1, S2 Respiratory: CTABL, on room air, normal work of breathing Psych: alert and oriented with normal affect  A/P: Appreciate Critical Care Team and their excellent care and management of this patient. Will continue to follow.   Saim Almanza, [place], DO 03/06/2017, 8:21 AM PGY-1, [facility] Family Medicine Service pager [phone]

## 2017-03-06 NOTE — Discharge Summary (Signed)
Physician Discharge Summary  Patient ID: LUDELLA PRANGER MRN: [id number] DOB/AGE: 07-24-1955 61 y.o.  Admit date: 02/27/2017 Discharge date: 03/11/2017  Admission Diagnoses:  Patient Active Problem List   Diagnosis Date Noted  . Pulmonary hypertension (HCC) 03/03/2017  . Non-rheumatic mitral regurgitation   . Hypothyroidism   . Elevated LFTs   . Acute exacerbation of CHF (congestive heart failure) (HCC)- Acute systolic Heart Failure 02/27/2017  . Acute pulmonary edema (HCC)   . Shortness of breath   . Tobacco abuse    Discharge Diagnoses:   Patient Active Problem List   Diagnosis Date Noted  . S/P MVR (mitral valve replacement) 03/04/2017  . Pulmonary hypertension (HCC) 03/03/2017  . Non-rheumatic mitral regurgitation   . Hypothyroidism   . Elevated LFTs   . Acute exacerbation of CHF (congestive heart failure) (HCC) 02/27/2017  . Acute pulmonary edema (HCC)   . Shortness of breath   . Tobacco abuse         Expected postoperative blood loss anemia Discharged Condition: good  History of Present Illness:  Ms. Sheaffer is a 61 yo female who presented with complaints of acute shortness of breath.  Workup revealed the patient to be in severe Atrial Fibrillation and echocardiogram showed severe mitral regurgitation.  She also underwent cardiac catheterization which showed no significant CAD.  It was felt surgery would be indicated and TCTS consult was obtained.  She was evaluated by Dr. Donata Clay who felt the patient would benefit from mitral valve replacement with MAZE procedure.  She would require dental clearance prior to proceeding with surgery.  Hospital Course:   She remained asymptomatic on heparin prior to surgery.  She was taken to the operating room on 12/59/2016.  She underwent Mitral Valve Replacement with a 29 mm Magna Ease Pericardial Tissue Valve.  She underwent a Bi-atrial MAZE procedure with application of a Left Atrial Clip.  She tolerated the procedure without  difficulty and was taken to the SICU in stable condition.  During her stay in the SICU the patient was weaned and extubated.  She was weaned off Neo-synephrine as tolerated.  Her chest tubes and arterial lines were removed without difficulty.  She was started on Coumadin slowly for her mitral valve replacement and MAZE procedure.  She was maintaining NSR and was stable for transfer to the telemetry unit in stable condition on 03/07/2017.  The patient continues to make progress.  She continues to maintain to NSR and her pacing wires have been removed without difficulty.  Her INR is 1.66 and she will be discharged home on 7.5 mg of coumadin.  Her goal INR range is 3.0  Results of INR need to be faxed to Dr. Annitta Jersey office at [phone].  She is ambulating without difficulty.  She is tolerating a diet.  She lives alone and will require SNF placement at discharge.  She is medically stable for discharge home today.     Significant Diagnostic Studies: cardiac graphics:   Echocardiogram:   - Left ventricle: The cavity size was mildly dilated. Wall   thickness was normal. Systolic function was normal. The estimated   ejection fraction was in the range of 55% to 60%. - Mitral valve: MV apparatus is moderately thickened. Posterior   leaflet appears tethered There is also some restricted motion of   anterior leaflet. There is poor coaptation of leaflets in systole   with resultant severe MR - Left atrium: The atrium was massively dilated. - Right atrium: The atrium was mildly  dilated. - Tricuspid valve: There was moderate regurgitation.  Treatments: surgery:   1. Sternotomy for mitral valve replacement ([facility]ardial [facility]     Magna Ease valve serial [id number]). 2. Biatrial maze procedure. 3. Application of left atrial 35 mm clip.  Disposition: 01-Home or Self Care   Discharge medications:  The patient has been discharged on:   1.Beta Blocker:  Yes [  ]                              No    [ x  ]                              If No, reason: 1st degree heart block  2.Ace Inhibitor/ARB: Yes [ x  ]                                     No  [    ]                                     If No, reason:  3.Statin:   Yes [   ]                  No  [  x ]                  If No, reason: NO CAD  4.Marlowe KaysEcasaCarlyle Basques:  Yes  [x   ]                  No   [   ]                  If No, reason:     Discharge Instructions    Amb Referral to Cardiac Rehabilitation   Complete by:  As directed    Diagnosis:  Valve Replacement   Valve:  Mitral     Allergies as of 03/11/2017   No Known Allergies     Medication List    STOP taking these medications   ATROVENT IN   multivitamin with minerals Tabs tablet   OVER THE COUNTER MEDICATION     TAKE these medications   acetaminophen 325 MG tablet Commonly known as:  TYLENOL Take 650 mg by mouth every 6 (six) hours as needed for headache (pain).   BC HEADACHE POWDER PO Take 1 packet by mouth 2 (two) times daily as needed (pain/headache).   escitalopram 20 MG tablet Commonly known as:  LEXAPRO Take 20 mg by mouth at bedtime.   furosemide 40 MG tablet Commonly known as:  LASIX Take 1 tablet (40 mg total) by mouth daily.   levothyroxine 100 MCG tablet Commonly known as:  SYNTHROID, LEVOTHROID Take 100 mcg by mouth daily before breakfast.   lisinopril 5 MG tablet Commonly known as:  PRINIVIL,ZESTRIL Take 1 tablet (5 mg total) by mouth daily.   mometasone-formoterol 200-5 MCG/ACT Aero Commonly known as:  DULERA Inhale 2 puffs into the lungs 2 (two) times daily.   nicotine 14 mg/24hr patch Commonly known as:  NICODERM CQ - dosed in mg/24 hours Place 1 patch (14 mg total) onto the skin daily.   oxyCODONE 5 MG immediate release tablet  Commonly known as:  Oxy IR/ROXICODONE Take 1 tablet (5 mg total) by mouth every 4 (four) hours as needed for severe pain.   potassium chloride SA 20 MEQ tablet Commonly known as:  K-DUR,KLOR-CON Take 1  tablet (20 mEq total) by mouth daily.   warfarin 7.5 MG tablet Commonly known as:  COUMADIN Take 1 tablet (7.5 mg total) by mouth daily at 6 PM.            Durable Medical Equipment  (From admission, onward)        Start     Ordered   03/09/17 1403  For home use only DME Walker rolling  Once    Comments:  Post op  Question:  Patient needs a walker to treat with the following condition  Answer:  Weakness   03/09/17 1402   03/09/17 1403  For home use only DME 3 n 1  Once     03/09/17 1402     Follow-up Information    Rinaldo Cloud, MD Follow up.   Specialty:  Cardiology Why:  Please contact office at discharge for PT/INR check 48 hours after discharge... Also set up a post operative follow up visit Contact information: [address] [place] [address] [phone]        Kerin Perna, MD Follow up on 04/08/2017.   Specialty:  Cardiothoracic Surgery Why:  Appointment is at 4:00, please get CXR at 3:30 at [facility] located on first floor of our office building Contact information: [address] [place] [address] [phone]        [facility] Follow up.   Specialty:  Home Health Services Why:  HHRN arranged- they will call you to set up home visits Contact information: [address] [place] [address] [phone]        [facility] - Dme Follow up.   Why:  3n1 and Rolling walker arranged- to be delivered to room prior to discharge Contact information: [address] [place] [address] [phone]           Signed: Lowella Dandy 03/11/2017, 7:51 AM

## 2017-03-06 NOTE — Progress Notes (Signed)
Subjective:  Doing well.  Denies any anginal chest pain or shortness of breath up in chair.  Objective:  Vital Signs in the last 24 hours: Temp:  [98 F (36.7 C)-100.4 F (38 C)] 98.7 F (37.1 C) (12/28 0754) Pulse Rate:  [62-89] 65 (12/28 1000) Resp:  [12-28] 22 (12/28 1000) BP: (102-131)/(57-92) 102/74 (12/28 1000) SpO2:  [92 %-99 %] 98 % (12/28 1000) Arterial Line BP: (104-155)/(46-67) 154/63 (12/28 0415) Weight:  [74.7 kg (164 lb 11.2 oz)] 74.7 kg (164 lb 11.2 oz) (12/28 0600)  Intake/Output from previous day: 12/27 0701 - 12/28 0700 In: 2763.6 [P.O.:1380; I.V.:783.6; IV Piggyback:600] Out: 3555 [Urine:3215; Chest Tube:340] Intake/Output from this shift: Total I/O In: 255.2 [P.O.:125; I.V.:80.2; IV Piggyback:50] Out: 250 [Urine:220; Chest Tube:30]  Physical Exam: Neck: no adenopathy, no carotid bruit, no JVD and supple, symmetrical, trachea midline Lungs: decreased breath sounds at bases, left more than right Heart: regular rate and rhythm and S1, S2 normal Abdomen: soft, non-tender; bowel sounds normal; no masses,  no organomegaly Extremities: extremities normal, atraumatic, no cyanosis or edema  Lab Results: Recent Labs    03/05/17 1555 03/05/17 1606 03/06/17 0406  WBC 13.1*  --  10.8*  HGB 9.8* 10.2* 9.4*  PLT 115*  --  94*   Recent Labs    03/05/17 0435  03/05/17 1606 03/06/17 0406  NA 139  --  137 130*  K 4.0  --  3.8 3.6  CL 108  --  99* 98*  CO2 24  --   --  25  GLUCOSE 134*  --  167* 118*  BUN 8  --  9 8  CREATININE 0.65   < > 0.70 0.65   < > = values in this interval not displayed.   No results for input(s): TROPONINI in the last 72 hours.  Invalid input(s): CK, MB Hepatic Function Panel No results for input(s): PROT, ALBUMIN, AST, ALT, ALKPHOS, BILITOT, BILIDIR, IBILI in the last 72 hours. No results for input(s): CHOL in the last 72 hours. No results for input(s): PROTIME in the last 72 hours.  Imaging: Imaging results have been reviewed  and Dg Chest Port 1 View  Result Date: 03/06/2017 CLINICAL DATA:  Status post mitral valve replacement. Chest tube present. Chest pain EXAM: PORTABLE CHEST 1 VIEW COMPARISON:  March 05, 2017 FINDINGS: Endotracheal tube and nasogastric tube have been removed. Swan-Ganz catheter is been removed. Cordis tip is in the superior vena cava. There is a mediastinal drain and a right chest tube, unchanged. No pneumothorax. There is airspace consolidation in the left lower lobe with small left pleural effusion. There is mild atelectatic change in the right base. The right lung is otherwise clear. Heart is borderline prominent with pulmonary vascularity within normal limits. Aortic atherosclerosis is noted. There is a mitral valve replacement as well as a left atrial appendage clamp. No bone lesions. IMPRESSION: Tube and catheter positions as described without pneumothorax. Persistent left lower lobe consolidation with small left pleural effusion. Mild right base atelectasis. Stable cardiac silhouette. There is aortic atherosclerosis. Aortic Atherosclerosis (ICD10-I70.0). Electronically Signed   By: Bretta BangWilliam  Woodruff III M.D.   On: 03/06/2017 07:28   Dg Chest Port 1 View  Result Date: 03/05/2017 CLINICAL DATA:  Status post mitral valve replacement EXAM: PORTABLE CHEST 1 VIEW COMPARISON:  03/04/2017 FINDINGS: Cardiac shadow is stable. Postsurgical changes are again seen. Swan-Ganz catheter, endotracheal tube, nasogastric catheter and right thoracostomy catheter are again identified. Mediastinal drain is present as well. Left atrial  clip is again noted. No pneumothorax is seen. Mild left retrocardiac atelectasis is noted slightly increased from the prior exam. Small left pleural effusion is noted. No acute bony abnormality is seen. IMPRESSION: Postoperative change with tubes and lines as described above. Slight increase in left retrocardiac atelectasis is noted. Electronically Signed   By: Alcide Clever M.D.   On:  03/05/2017 07:48   Dg Chest Port 1 View  Result Date: 03/04/2017 CLINICAL DATA:  Post open heart surgery. EXAM: PORTABLE CHEST 1 VIEW COMPARISON:  03/03/2017 FINDINGS: The patient is intubated. Endotracheal tube terminates 6 cm above the carina. Enteric catheter descends into the abdomen and is collimated off the image. Mediastinal and chest tubes are in place. Right IJ approach Swan-Ganz catheter terminates in the expected location of main pulmonary trunk. Valvular prosthesis is seen. Left atrial appendage clamp is in place. Cardiomediastinal silhouette is normal. Mediastinal contours appear intact. Calcific atherosclerotic disease of the aorta. There is no evidence of focal airspace consolidation, or pneumothorax. Small bilateral pleural effusions. Osseous structures are without acute abnormality. Soft tissues are grossly normal. IMPRESSION: Small bilateral pleural effusions. Support apparatus as described. Electronically Signed   By: Ted Mcalpine M.D.   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  Doing well Moderate pulmonary hypertension Status post paroxysmal A. fib with RVR Hypothyroidism Depression Tobacco abuse Postop anemia Postop atelectasis Plan Continue present management as per CVTS Dr.Kadakia on call for me until January 1  LOS: 7 days    Rinaldo Cloud 03/06/2017, 11:36 AM

## 2017-03-06 NOTE — Progress Notes (Signed)
Patient ID: Diane Nielsen, female   DOB: 04-06-1955, 61 y.o.   MRN: [id number] TCTS Evening Rounds:  Hemodynamically stable. Dopamine and Milrinone are off.  DDD paced at 60.  Diuresing well  BMET    Component Value Date/Time   NA 130 (L) 03/06/2017 1630   K 4.3 03/06/2017 1630   CL 92 (L) 03/06/2017 1630   CO2 25 03/06/2017 0406   GLUCOSE 136 (H) 03/06/2017 1630   BUN 10 03/06/2017 1630   CREATININE 0.70 03/06/2017 1630   CALCIUM 8.4 (L) 03/06/2017 0406   GFRNONAA >60 03/06/2017 0406   GFRAA >60 03/06/2017 0406   CBC    Component Value Date/Time   WBC 10.8 (H) 03/06/2017 0406   RBC 2.98 (L) 03/06/2017 0406   HGB 9.9 (L) 03/06/2017 1630   HCT 29.0 (L) 03/06/2017 1630   PLT 94 (L) 03/06/2017 0406   MCV 92.6 03/06/2017 0406   MCH 31.5 03/06/2017 0406   MCHC 34.1 03/06/2017 0406   RDW 14.9 03/06/2017 0406   LYMPHSABS 6.0 (H) 02/27/2017 1300   MONOABS 1.0 02/27/2017 1300   EOSABS 0.3 02/27/2017 1300   BASOSABS 0.0 02/27/2017 1300

## 2017-03-07 ENCOUNTER — Inpatient Hospital Stay (HOSPITAL_COMMUNITY): Payer: PRIVATE HEALTH INSURANCE

## 2017-03-07 LAB — CBC
HCT: 28 % — ABNORMAL LOW (ref 36.0–46.0)
Hemoglobin: 9.3 g/dL — ABNORMAL LOW (ref 12.0–15.0)
MCH: 31.1 pg (ref 26.0–34.0)
MCHC: 33.2 g/dL (ref 30.0–36.0)
MCV: 93.6 fL (ref 78.0–100.0)
Platelets: 85 10*3/uL — ABNORMAL LOW (ref 150–400)
RBC: 2.99 MIL/uL — ABNORMAL LOW (ref 3.87–5.11)
RDW: 14.6 % (ref 11.5–15.5)
WBC: 6.6 10*3/uL (ref 4.0–10.5)

## 2017-03-07 LAB — BASIC METABOLIC PANEL
Anion gap: 10 (ref 5–15)
BUN: 10 mg/dL (ref 6–20)
CO2: 26 mmol/L (ref 22–32)
Calcium: 8.2 mg/dL — ABNORMAL LOW (ref 8.9–10.3)
Chloride: 94 mmol/L — ABNORMAL LOW (ref 101–111)
Creatinine, Ser: 0.81 mg/dL (ref 0.44–1.00)
GFR calc Af Amer: >60 mL/min (ref >60)
GFR calc non Af Amer: >60 mL/min (ref >60)
Glucose, Bld: 102 mg/dL — ABNORMAL HIGH (ref 65–99)
Potassium: 4.4 mmol/L (ref 3.5–5.1)
Sodium: 130 mmol/L — ABNORMAL LOW (ref 135–145)

## 2017-03-07 LAB — PROTIME-INR
INR: 1.07
PROTHROMBIN TIME: 13.8 s (ref 11.4–15.2)

## 2017-03-07 LAB — GLUCOSE, CAPILLARY
GLUCOSE-CAPILLARY: 97 mg/dL (ref 65–99)
Glucose-Capillary: 100 mg/dL — ABNORMAL HIGH (ref 65–99)
Glucose-Capillary: 100 mg/dL — ABNORMAL HIGH (ref 65–99)

## 2017-03-07 MED ORDER — DOCUSATE SODIUM 100 MG PO CAPS
200.0000 mg | ORAL_CAPSULE | Freq: Every day | ORAL | Status: DC
  Administered 2017-03-08 – 2017-03-11 (×4): 200 mg via ORAL
  Filled 2017-03-07 (×4): qty 2

## 2017-03-07 MED ORDER — BISACODYL 5 MG PO TBEC: 10.0000 mg | DELAYED_RELEASE_TABLET | Freq: Every day | ORAL | Status: DC | PRN

## 2017-03-07 MED ORDER — ACETAMINOPHEN 325 MG PO TABS
650.0000 mg | ORAL_TABLET | Freq: Four times a day (QID) | ORAL | Status: DC | PRN
  Administered 2017-03-07 – 2017-03-10 (×4): 650 mg via ORAL
  Filled 2017-03-07 (×6): qty 2

## 2017-03-07 MED ORDER — SODIUM CHLORIDE 0.9% FLUSH: 3.0000 mL | INTRAVENOUS | Status: DC | PRN

## 2017-03-07 MED ORDER — OXYCODONE HCL 5 MG PO TABS
5.0000 mg | ORAL_TABLET | ORAL | Status: DC | PRN
  Administered 2017-03-08 – 2017-03-09 (×2): 5 mg via ORAL
  Administered 2017-03-10: 10 mg via ORAL
  Filled 2017-03-07: qty 1
  Filled 2017-03-07: qty 2
  Filled 2017-03-07: qty 1

## 2017-03-07 MED ORDER — ONDANSETRON HCL 4 MG/2ML IJ SOLN
4.0000 mg | Freq: Four times a day (QID) | INTRAMUSCULAR | Status: DC | PRN
  Administered 2017-03-07: 4 mg via INTRAVENOUS
  Filled 2017-03-07: qty 2

## 2017-03-07 MED ORDER — ONDANSETRON HCL 4 MG PO TABS: 4.0000 mg | ORAL_TABLET | Freq: Four times a day (QID) | ORAL | Status: DC | PRN

## 2017-03-07 MED ORDER — SODIUM CHLORIDE 0.9% FLUSH
3.0000 mL | Freq: Two times a day (BID) | INTRAVENOUS | Status: DC
  Administered 2017-03-07 – 2017-03-10 (×5): 3 mL via INTRAVENOUS

## 2017-03-07 MED ORDER — TRAMADOL HCL 50 MG PO TABS
50.0000 mg | ORAL_TABLET | Freq: Four times a day (QID) | ORAL | Status: DC | PRN
Start: 2017-03-07 — End: 2017-03-11

## 2017-03-07 MED ORDER — SODIUM CHLORIDE 0.9 % IV SOLN: 250.0000 mL | INTRAVENOUS | Status: DC | PRN

## 2017-03-07 MED ORDER — BISACODYL 10 MG RE SUPP: 10.0000 mg | Freq: Every day | RECTAL | Status: DC | PRN

## 2017-03-07 MED ORDER — PANTOPRAZOLE SODIUM 40 MG PO TBEC
40.0000 mg | DELAYED_RELEASE_TABLET | Freq: Every day | ORAL | Status: DC
  Administered 2017-03-08 – 2017-03-11 (×4): 40 mg via ORAL
  Filled 2017-03-07 (×4): qty 1

## 2017-03-07 MED ORDER — MOVING RIGHT ALONG BOOK
Freq: Once | Status: AC
  Administered 2017-03-07: 18:00:00
  Filled 2017-03-07: qty 1

## 2017-03-07 NOTE — Progress Notes (Signed)
3 Days Post-Op Procedure(s) (LRB): MITRAL VALVE (MV) REPLACEMENT, MAZE PROCEDURE (N/A) MAZE (N/A) CLIPPING OF ATRIAL APPENDAGE (N/A) Subjective: No complaints  Objective: Vital signs in last 24 hours: Temp:  [98.2 F (36.8 C)-99.3 F (37.4 C)] 98.4 F (36.9 C) (12/29 0700) Pulse Rate:  [60-72] 70 (12/29 0851) Cardiac Rhythm: A-V Sequential paced (12/29 0800) Resp:  [14-27] 14 (12/29 0851) BP: (91-138)/(57-89) 133/87 (12/29 0851) SpO2:  [88 %-100 %] 94 % (12/29 0851) Weight:  [74.3 kg (163 lb 11.2 oz)] 74.3 kg (163 lb 11.2 oz) (12/29 0600)  Hemodynamic parameters for last 24 hours:    Intake/Output from previous day: 12/28 0701 - 12/29 0700 In: 465.2 [P.O.:125; I.V.:290.2; IV Piggyback:50] Out: 1205 [Urine:1125; Chest Tube:80] Intake/Output this shift: Total I/O In: 15 [I.V.:15] Out: -   General appearance: alert and cooperative Neurologic: intact Heart: regular rate and rhythm, S1, S2 normal, no murmur, click, rub or gallop Lungs: clear to auscultation bilaterally Extremities: edema mild in left arm Wound: dressings dry  Lab Results: Recent Labs    03/06/17 0406 03/06/17 1630 03/07/17 0419  WBC 10.8*  --  6.6  HGB 9.4* 9.9* 9.3*  HCT 27.6* 29.0* 28.0*  PLT 94*  --  85*   BMET:  Recent Labs    03/06/17 0406 03/06/17 1630 03/07/17 0419  NA 130* 130* 130*  K 3.6 4.3 4.4  CL 98* 92* 94*  CO2 25  --  26  GLUCOSE 118* 136* 102*  BUN 8 10 10   CREATININE 0.65 0.70 0.81  CALCIUM 8.4*  --  8.2*    PT/INR:  Recent Labs    03/07/17 0419  LABPROT 13.8  INR 1.07   ABG    Component Value Date/Time   PHART 7.410 03/05/2017 1601   HCO3 22.7 03/05/2017 1601   TCO2 25 03/06/2017 1630   ACIDBASEDEF 2.0 03/05/2017 1601   O2SAT 95.0 03/05/2017 1601   CBG (last 3)  Recent Labs    03/06/17 2313 03/07/17 0438 03/07/17 0824  GLUCAP 118* 97 100*   CXR: small left effusion and left base atelectasis.  Assessment/Plan: S/P Procedure(s) (LRB): MITRAL  VALVE (MV) REPLACEMENT, MAZE PROCEDURE (N/A) MAZE (N/A) CLIPPING OF ATRIAL APPENDAGE (N/A)  POD 3  She is hemodynamically stable in sinus rhythm 70's. Pacer turned off.   Volume excess: she is diuresing well and weight decreasing. Still 12 lbs over preop.  Continue coumadin for bioprosthetic MVR and MAZE.  Can transfer to 4E to continue IS and ambulation.   LOS: 8 days    Alleen Borne 03/07/2017

## 2017-03-07 NOTE — Progress Notes (Signed)
Ref: Patient, No Pcp Per   Subjective:  Sitting up and self feeding. A-paced rhythm at 70/min. VS stable.  Objective:  Vital Signs in the last 24 hours: Temp:  [98.2 F (36.8 C)-99.3 F (37.4 C)] 98.4 F (36.9 C) (12/29 0700) Pulse Rate:  [60-72] 70 (12/29 0851) Cardiac Rhythm: A-V Sequential paced (12/29 0800) Resp:  [14-27] 14 (12/29 0851) BP: (91-138)/(57-89) 133/87 (12/29 0851) SpO2:  [88 %-100 %] 94 % (12/29 0851) Weight:  [74.3 kg (163 lb 11.2 oz)] 74.3 kg (163 lb 11.2 oz) (12/29 0600)  Physical Exam: BP Readings from Last 1 Encounters:  03/07/17 133/87     Wt Readings from Last 1 Encounters:  03/07/17 74.3 kg (163 lb 11.2 oz)    Weight change: -0.454 kg () Body mass index is 27.24 kg/m. HEENT: [place]/AT, Eyes- Conjunctiva-Pale pink, Sclera-Non-icteric Neck: No JVD, No bruit, Trachea midline. Lungs:  Clear, Bilateral. Midline surgical scar. Cardiac:  Regular paced rhythm, normal S1 and S2, no S3. II/VI systolic murmur. Abdomen:  Soft, non-tender. BS present. Extremities:  No edema present. No cyanosis. No clubbing. CNS: AxOx3, Cranial nerves grossly intact, moves all 4 extremities.  Skin: Warm and dry.   Intake/Output from previous day: 12/28 0701 - 12/29 0700 In: 465.2 [P.O.:125; I.V.:290.2; IV Piggyback:50] Out: 1205 [Urine:1125; Chest Tube:80]    Lab Results: BMET    Component Value Date/Time   NA 130 (L) 03/07/2017 0419   NA 130 (L) 03/06/2017 1630   NA 130 (L) 03/06/2017 0406   K 4.4 03/07/2017 0419   K 4.3 03/06/2017 1630   K 3.6 03/06/2017 0406   CL 94 (L) 03/07/2017 0419   CL 92 (L) 03/06/2017 1630   CL 98 (L) 03/06/2017 0406   CO2 26 03/07/2017 0419   CO2 25 03/06/2017 0406   CO2 24 03/05/2017 0435   GLUCOSE 102 (H) 03/07/2017 0419   GLUCOSE 136 (H) 03/06/2017 1630   GLUCOSE 118 (H) 03/06/2017 0406   BUN 10 03/07/2017 0419   BUN 10 03/06/2017 1630   BUN 8 03/06/2017 0406   CREATININE 0.81 03/07/2017 0419   CREATININE 0.70 03/06/2017 1630    CREATININE 0.65 03/06/2017 0406   CALCIUM 8.2 (L) 03/07/2017 0419   CALCIUM 8.4 (L) 03/06/2017 0406   CALCIUM 8.0 (L) 03/05/2017 0435   GFRNONAA >60 03/07/2017 0419   GFRNONAA >60 03/06/2017 0406   GFRNONAA >60 03/05/2017 1555   GFRAA >60 03/07/2017 0419   GFRAA >60 03/06/2017 0406   GFRAA >60 03/05/2017 1555   CBC    Component Value Date/Time   WBC 6.6 03/07/2017 0419   RBC 2.99 (L) 03/07/2017 0419   HGB 9.3 (L) 03/07/2017 0419   HCT 28.0 (L) 03/07/2017 0419   PLT 85 (L) 03/07/2017 0419   MCV 93.6 03/07/2017 0419   MCH 31.1 03/07/2017 0419   MCHC 33.2 03/07/2017 0419   RDW 14.6 03/07/2017 0419   LYMPHSABS 6.0 (H) 02/27/2017 1300   MONOABS 1.0 02/27/2017 1300   EOSABS 0.3 02/27/2017 1300   BASOSABS 0.0 02/27/2017 1300   HEPATIC Function Panel Recent Labs    02/28/17 0633 03/01/17 0506 03/02/17 0550  PROT 6.3* 6.1* 5.8*   HEMOGLOBIN A1C No components found for: HGA1C,  MPG CARDIAC ENZYMES Lab Results  Component Value Date   TROPONINI 0.03 (HH) 03/01/2017   TROPONINI 0.04 (HH) 03/01/2017   BNP No results for input(s): PROBNP in the last 8760 hours. TSH Recent Labs    02/28/17 0633  TSH 2.548  CHOLESTEROL Recent Labs    02/28/17 0633  CHOL 136    Scheduled Meds: . acetaminophen  1,000 mg Oral Q6H   Or  . acetaminophen (TYLENOL) oral liquid 160 mg/5 mL  1,000 mg Per Tube Q6H  . aspirin EC  81 mg Oral Daily  . bisacodyl  10 mg Oral Daily   Or  . bisacodyl  10 mg Rectal Daily  . Chlorhexidine Gluconate Cloth  6 each Topical Daily  . docusate sodium  200 mg Oral Daily  . enoxaparin (LOVENOX) injection  40 mg Subcutaneous Q24H  . escitalopram  20 mg Oral Daily  . furosemide  40 mg Oral Daily  . insulin aspart  0-24 Units Subcutaneous Q4H  . levalbuterol  1.25 mg Nebulization TID  . levothyroxine  100 mcg Oral QAC breakfast  . mouth rinse  15 mL Mouth Rinse BID  . metoCLOPramide (REGLAN) injection  10 mg Intravenous Q6H  . mometasone-formoterol   2 puff Inhalation BID  . pantoprazole  40 mg Oral Daily  . potassium chloride  20 mEq Oral Daily  . sodium chloride flush  10-40 mL Intracatheter Q12H  . sodium chloride flush  3 mL Intravenous Q12H  . warfarin  5 mg Oral q1800  . Warfarin - Physician Dosing Inpatient   Does not apply q1800   Continuous Infusions: . sodium chloride    . amiodarone Stopped (03/05/17 0830)  . lactated ringers    . 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  Increase activity as tolerated.   LOS: 8 days    Orpah CobbAjay Cecely Rengel  MD  03/07/2017, 9:46 AM

## 2017-03-07 NOTE — Plan of Care (Signed)
  Progressing Coping: Level of anxiety will decrease 03/07/2017 2354 - Progressing by Ruel Favors, RN Elimination: Will not experience complications related to bowel motility 03/07/2017 2354 - Progressing by Ruel Favors, RN Will not experience complications related to urinary retention 03/07/2017 2354 - Progressing by Ruel Favors, RN Pain Managment: General experience of comfort will improve 03/07/2017 2354 - Progressing by Ruel Favors, RN Skin Integrity: Risk for impaired skin integrity will decrease 03/07/2017 2354 - Progressing by Ruel Favors, RN Activity: Capacity to carry out activities will improve 03/07/2017 2354 - Progressing by Ruel Favors, RN Cardiac: Ability to achieve and maintain adequate cardiopulmonary perfusion will improve 03/07/2017 2354 - Progressing by Ruel Favors, RN Education: Ability to demonstrate proper wound care will improve 03/07/2017 2354 - Progressing by Ruel Favors, RN Knowledge of disease or condition will improve 03/07/2017 2354 - Progressing by Ruel Favors, RN Knowledge of the prescribed therapeutic regimen will improve 03/07/2017 2354 - Progressing by Ruel Favors, RN Activity: Risk for activity intolerance will decrease 03/07/2017 2354 - Progressing by Ruel Favors, RN Cardiac: Hemodynamic stability will improve 03/07/2017 2354 - Progressing by Ruel Favors, RN Clinical Measurements: Postoperative complications will be avoided or minimized 03/07/2017 2354 - Progressing by Ruel Favors, RN Respiratory: Respiratory status will improve 03/07/2017 2354 - Progressing by Ruel Favors, RN Skin Integrity: Wound healing without signs and symptoms of infection 03/07/2017 2354 - Progressing by Ruel Favors, RN Risk for impaired skin integrity will decrease 03/07/2017 2354 - Progressing by  Ruel Favors, RN Urinary Elimination: Ability to achieve and maintain adequate renal perfusion and functioning will improve 03/07/2017 2354 - Progressing by Ruel Favors, RN

## 2017-03-08 LAB — BASIC METABOLIC PANEL
Anion gap: 7 (ref 5–15)
BUN: 11 mg/dL (ref 6–20)
CALCIUM: 8.3 mg/dL — AB (ref 8.9–10.3)
CO2: 25 mmol/L (ref 22–32)
CREATININE: 0.84 mg/dL (ref 0.44–1.00)
Chloride: 97 mmol/L — ABNORMAL LOW (ref 101–111)
GFR calc Af Amer: >60 mL/min (ref >60)
GLUCOSE: 116 mg/dL — AB (ref 65–99)
POTASSIUM: 4.5 mmol/L (ref 3.5–5.1)
SODIUM: 129 mmol/L — AB (ref 135–145)

## 2017-03-08 LAB — CBC
HCT: 28.7 % — ABNORMAL LOW (ref 36.0–46.0)
Hemoglobin: 9.5 g/dL — ABNORMAL LOW (ref 12.0–15.0)
MCH: 31.1 pg (ref 26.0–34.0)
MCHC: 33.1 g/dL (ref 30.0–36.0)
MCV: 94.1 fL (ref 78.0–100.0)
PLATELETS: 88 10*3/uL — AB (ref 150–400)
RBC: 3.05 MIL/uL — ABNORMAL LOW (ref 3.87–5.11)
RDW: 14.4 % (ref 11.5–15.5)
WBC: 7.4 10*3/uL (ref 4.0–10.5)

## 2017-03-08 LAB — PROTIME-INR
INR: 1.08
PROTHROMBIN TIME: 14 s (ref 11.4–15.2)

## 2017-03-08 MED ORDER — COUMADIN BOOK
Freq: Once | Status: AC
  Administered 2017-03-08: 15:00:00
  Filled 2017-03-08: qty 1

## 2017-03-08 MED ORDER — WARFARIN VIDEO: Freq: Once | Status: DC

## 2017-03-08 MED ORDER — WARFARIN SODIUM 7.5 MG PO TABS
7.5000 mg | ORAL_TABLET | Freq: Every day | ORAL | Status: DC
  Administered 2017-03-08 – 2017-03-10 (×3): 7.5 mg via ORAL
  Filled 2017-03-08 (×4): qty 1

## 2017-03-08 NOTE — Progress Notes (Signed)
Made 4 attempts to call the video education number which kept ringing to request the warfarin education video, will pass off to night shift that I could not reach video education.

## 2017-03-08 NOTE — Progress Notes (Signed)
Ref: Patient, No Pcp Per   Subjective:  Awake. Monitor-sinus rhythm. Afebrile. Ambulating.   Objective:  Vital Signs in the last 24 hours: Temp:  [97.7 F (36.5 C)-98.9 F (37.2 C)] 98.8 F (37.1 C) (12/30 0424) Pulse Rate:  [72-77] 75 (12/30 0424) Cardiac Rhythm: Normal sinus rhythm;Heart block (12/30 0700) Resp:  [16-26] 26 (12/30 0424) BP: (114-148)/(65-86) 124/79 (12/30 0424) SpO2:  [90 %-100 %] 100 % (12/30 0832) FiO2 (%):  [50 %] 50 % (12/29 1657) Weight:  [73.6 kg (162 lb 3.2 oz)] 73.6 kg (162 lb 3.2 oz) (12/30 0155)  Physical Exam: BP Readings from Last 1 Encounters:  03/08/17 124/79     Wt Readings from Last 1 Encounters:  03/08/17 73.6 kg (162 lb 3.2 oz)    Weight change: -0.68 kg (-8 oz) Body mass index is 26.99 kg/m. HEENT: [place]/AT, Eyes-Blue, PERL, EOMI, Conjunctiva-Pale pink, Sclera-Non-icteric Neck: No JVD, No bruit, Trachea midline. Lungs:  Clear, Bilateral. Cardiac:  Regular rhythm, normal S1 and S2, no S3. II/VI systolic murmur. Abdomen:  Soft, non-tender. BS present. Extremities:  No edema present. No cyanosis. No clubbing. CNS: AxOx3, Cranial nerves grossly intact, moves all 4 extremities.  Skin: Warm and dry.   Intake/Output from previous day: 12/29 0701 - 12/30 0700 In: 15 [I.V.:15] Out: 600 [Urine:600]    Lab Results: BMET    Component Value Date/Time   NA 129 (L) 03/08/2017 0257   NA 130 (L) 03/07/2017 0419   NA 130 (L) 03/06/2017 1630   K 4.5 03/08/2017 0257   K 4.4 03/07/2017 0419   K 4.3 03/06/2017 1630   CL 97 (L) 03/08/2017 0257   CL 94 (L) 03/07/2017 0419   CL 92 (L) 03/06/2017 1630   CO2 25 03/08/2017 0257   CO2 26 03/07/2017 0419   CO2 25 03/06/2017 0406   GLUCOSE 116 (H) 03/08/2017 0257   GLUCOSE 102 (H) 03/07/2017 0419   GLUCOSE 136 (H) 03/06/2017 1630   BUN 11 03/08/2017 0257   BUN 10 03/07/2017 0419   BUN 10 03/06/2017 1630   CREATININE 0.84 03/08/2017 0257   CREATININE 0.81 03/07/2017 0419   CREATININE 0.70  03/06/2017 1630   CALCIUM 8.3 (L) 03/08/2017 0257   CALCIUM 8.2 (L) 03/07/2017 0419   CALCIUM 8.4 (L) 03/06/2017 0406   GFRNONAA >60 03/08/2017 0257   GFRNONAA >60 03/07/2017 0419   GFRNONAA >60 03/06/2017 0406   GFRAA >60 03/08/2017 0257   GFRAA >60 03/07/2017 0419   GFRAA >60 03/06/2017 0406   CBC    Component Value Date/Time   WBC 7.4 03/08/2017 0257   RBC 3.05 (L) 03/08/2017 0257   HGB 9.5 (L) 03/08/2017 0257   HCT 28.7 (L) 03/08/2017 0257   PLT 88 (L) 03/08/2017 0257   MCV 94.1 03/08/2017 0257   MCH 31.1 03/08/2017 0257   MCHC 33.1 03/08/2017 0257   RDW 14.4 03/08/2017 0257   LYMPHSABS 6.0 (H) 02/27/2017 1300   MONOABS 1.0 02/27/2017 1300   EOSABS 0.3 02/27/2017 1300   BASOSABS 0.0 02/27/2017 1300   HEPATIC Function Panel Recent Labs    02/28/17 0633 03/01/17 0506 03/02/17 0550  PROT 6.3* 6.1* 5.8*   HEMOGLOBIN A1C No components found for: HGA1C,  MPG CARDIAC ENZYMES Lab Results  Component Value Date   TROPONINI 0.03 (HH) 03/01/2017   TROPONINI 0.04 (HH) 03/01/2017   BNP No results for input(s): PROBNP in the last 8760 hours. TSH Recent Labs    02/28/17 0633  TSH 2.548   CHOLESTEROL  Recent Labs    02/28/17 0633  CHOL 136    Scheduled Meds: . docusate sodium  200 mg Oral Daily  . escitalopram  20 mg Oral Daily  . furosemide  40 mg Oral Daily  . levothyroxine  100 mcg Oral QAC breakfast  . mouth rinse  15 mL Mouth Rinse BID  . mometasone-formoterol  2 puff Inhalation BID  . pantoprazole  40 mg Oral QAC breakfast  . potassium chloride  20 mEq Oral Daily  . sodium chloride flush  3 mL Intravenous Q12H  . warfarin  7.5 mg Oral q1800  . Warfarin - Physician Dosing Inpatient   Does not apply q1800   Continuous Infusions: . sodium chloride     PRN Meds:.sodium chloride, acetaminophen, bisacodyl **OR** bisacodyl, ondansetron **OR** ondansetron (ZOFRAN) IV, oxyCODONE, sodium chloride flush, traMADol  Assessment/Plan: S/P MV replacement-Tissue  valve, Maze procedure and clipping of left atrial appendage Paroxysmal atrial fibrillation Hypothyroidism Tobacco use disorder Anemia of blood loss Pulmonary hypertension Hyponatremia  Discussed diet, activity and medications with patient. Also discussed deep breathing to avoid atelectasis.  Continue diuresis as tolerated.   LOS: 9 days    Orpah Cobb  MD  03/08/2017, 11:38 AM

## 2017-03-08 NOTE — Progress Notes (Signed)
FPTS Interim Progress Note  S: Patient reporting cough this AM. Otherwise doing well.   O: BP 124/79 (BP Location: Right Leg)   Pulse 75   Temp 98.8 F (37.1 C) (Oral)   Resp (!) 26   Ht 5\' 5"  (1.651 m)   Wt 162 lb 3.2 oz (73.6 kg)   SpO2 100%   BMI 26.99 kg/m    A/P: Appreciate excellent care of CVTS team. Will continue to follow socially.   Marquette Saa, MD 03/08/2017, 9:07 AM PGY-3, [facility] Family Medicine Service pager [phone]

## 2017-03-08 NOTE — Discharge Instructions (Addendum)
Home Health Nurse:  Please fax results of PT/INR to Dr. Annitta Jersey Office at [phone]      Information on my medicine - Coumadin   (Warfarin)  This medication education was reviewed with me or my healthcare representative as part of my discharge preparation.  The pharmacist that spoke with me during my hospital stay was:  Fredrik Rigger, [facility]  Why was Coumadin prescribed for you? Coumadin was prescribed for you because you have a blood clot or a medical condition that can cause an increased risk of forming blood clots. Blood clots can cause serious health problems by blocking the flow of blood to the heart, lung, or brain. Coumadin can prevent harmful blood clots from forming. As a reminder your indication for Coumadin is:   Stroke Prevention Because Of Atrial Fibrillation  What test will check on my response to Coumadin? While on Coumadin (warfarin) you will need to have an INR test regularly to ensure that your dose is keeping you in the desired range. The INR (international normalized ratio) number is calculated from the result of the laboratory test called prothrombin time (PT).  If an INR APPOINTMENT HAS NOT ALREADY BEEN MADE FOR YOU please schedule an appointment to have this lab work done by your health care provider within 7 days. Your INR goal is usually a number between:  2 to 3 or your provider may give you a more narrow range like 2-2.5.  Ask your health care provider during an office visit what your goal INR is.  What  do you need to  know  About  COUMADIN? Take Coumadin (warfarin) exactly as prescribed by your healthcare provider about the same time each day.  DO NOT stop taking without talking to the doctor who prescribed the medication.  Stopping without other blood clot prevention medication to take the place of Coumadin may increase your risk of developing a new clot or stroke.  Get refills before you run out.  What do you do if you miss a dose? If you miss a  dose, take it as soon as you remember on the same day then continue your regularly scheduled regimen the next day.  Do not take two doses of Coumadin at the same time.  Important Safety Information A possible side effect of Coumadin (Warfarin) is an increased risk of bleeding. You should call your healthcare provider right away if you experience any of the following: ? Bleeding from an injury or your nose that does not stop. ? Unusual colored urine (red or dark brown) or unusual colored stools (red or black). ? Unusual bruising for unknown reasons. ? A serious fall or if you hit your head (even if there is no bleeding).  Some foods or medicines interact with Coumadin (warfarin) and might alter your response to warfarin. To help avoid this: ? Eat a balanced diet, maintaining a consistent amount of Vitamin K. ? Notify your provider about major diet changes you plan to make. ? Avoid alcohol or limit your intake to 1 drink for women and 2 drinks for men per day. (1 drink is 5 oz. wine, 12 oz. beer, or 1.5 oz. liquor.)  Make sure that ANY health care provider who prescribes medication for you knows that you are taking Coumadin (warfarin).  Also make sure the healthcare provider who is monitoring your Coumadin knows when you have started a new medication including herbals and non-prescription products.  Coumadin (Warfarin)  Major Drug Interactions  Increased Warfarin Effect Decreased Warfarin  Effect  Alcohol (large quantities) Antibiotics (esp. Septra/Bactrim, Flagyl, Cipro) Amiodarone (Cordarone) Aspirin (ASA) Cimetidine (Tagamet) Megestrol (Megace) NSAIDs (ibuprofen, naproxen, etc.) Piroxicam (Feldene) Propafenone (Rythmol SR) Propranolol (Inderal) Isoniazid (INH) Posaconazole (Noxafil) Barbiturates (Phenobarbital) Carbamazepine (Tegretol) Chlordiazepoxide (Librium) Cholestyramine (Questran) Griseofulvin Oral Contraceptives Rifampin Sucralfate (Carafate) Vitamin K   Coumadin  (Warfarin) Major Herbal Interactions  Increased Warfarin Effect Decreased Warfarin Effect  Garlic Ginseng Ginkgo biloba Coenzyme Q10 Green tea St. Johns wort    Coumadin (Warfarin) FOOD Interactions  Eat a consistent number of servings per week of foods HIGH in Vitamin K (1 serving =  cup)  Collards (cooked, or boiled & drained) Kale (cooked, or boiled & drained) Mustard greens (cooked, or boiled & drained) Parsley *serving size only =  cup Spinach (cooked, or boiled & drained) Swiss chard (cooked, or boiled & drained) Turnip greens (cooked, or boiled & drained)  Eat a consistent number of servings per week of foods MEDIUM-HIGH in Vitamin K (1 serving = 1 cup)  Asparagus (cooked, or boiled & drained) Broccoli (cooked, boiled & drained, or raw & chopped) Brussel sprouts (cooked, or boiled & drained) *serving size only =  cup Lettuce, raw (green leaf, endive, romaine) Spinach, raw Turnip greens, raw & chopped   These websites have more information on Coumadin (warfarin):  http://www.king-russell.com/; https://www.hines.net/;   Mitral Valve Replacement, Care After This sheet gives you information about how to care for yourself after your procedure. Your health care provider may also give you more specific instructions. If you have problems or questions, contact your health care provider. What can I expect after the procedure? After the procedure, it is common to have:  Pain at the incision area that may last for several weeks.  Follow these instructions at home: Incision care  Follow instructions from your health care provider about how to take care of your incision. Make sure you: ? Wash your hands with soap and water before you change your bandage (dressing). If soap and water are not available, use hand sanitizer. ? Change your dressing as told by your health care provider. ? Leave stitches (sutures), skin glue, or adhesive strips in place. These skin closures may  need to stay in place for 2 weeks or longer. If adhesive strip edges start to loosen and curl up, you may trim the loose edges. Do not remove adhesive strips completely unless your health care provider tells you to do that.  Check your incision area every day for signs of infection. Check for: ? More redness, swelling, or pain. ? More fluid or blood. ? Warmth. ? Pus or a bad smell.  Do not apply powder or lotion to the area. Driving  Do not drive until your health care provider approves.  Do not drive or use heavy machinery while taking prescription pain medicines. Bathing  Do not take baths, swim, or use a hot tub for 2-4 weeks after surgery, or until your health care provider approves. Ask your health care provider if you may take showers.  To wash the incision site, gently wash with soap and water and pat the area dry with a clean towel. Do not rub the incision area. That may cause bleeding. Activity  Rest as told by your health care provider. Ask your health care provider when you can resume normal activities, including sexual activity.  Avoid the following activities for 6-8 weeks, or as long as directed: ? Lifting anything that is heavier than 10 lb (4.5 kg), or the limit that your health  care provider tells you. ? Pushing or pulling things with your arms.  Avoid climbing stairs and using the handrail to pull yourself up for the first 2-3 weeks after surgery.  Avoid airplane travel for 4-6 weeks, or as long as directed.  Avoid sitting for long periods of time and crossing your legs. Get up and move around at least once every 1-2 hours.  If you are taking blood thinners (anticoagulants), avoid activities that have a high risk of injury. Ask your health care provider what activities are safe for you. Lifestyle  Limit alcohol intake to no more than 1 drink a day for nonpregnant women and 2 drinks a day for men. One drink equals 12 oz of beer, 5 oz of wine, or 1 oz of hard  liquor.  Do not use any products that contain nicotine or tobacco, such as cigarettes and e-cigarettes. If you need help quitting, ask your health care provider. General instructions  Take your temperature every day and weigh yourself every morning for the first 7 days after surgery. Write your temperatures and weight down and take this record with you to any follow-up visits.  Take over-the-counter and prescription medicines only as told by your health care provider.  To prevent or treat constipation while you are taking prescription pain medicine, your health care provider may recommend that you: ? Drink enough fluid to keep your urine clear or pale yellow. ? Take over-the-counter or prescription medicines. ? Eat foods that are high in fiber, such as fresh fruits and vegetables, whole grains, and beans. ? Limit foods that are high in fat and processed sugars, such as fried and sweet foods.  Follow instructions from your health care provider about eating or drinking restrictions.  Wear compression stockings for at least 2 weeks, or as long as told by your health care provider. These stockings help to prevent blood clots and reduce swelling in your legs. If your ankles are swollen after 2 weeks, continue to wear the stockings.  Keep all follow-up visits as told by your health care provider. This is important. Contact a health care provider if:  You develop a skin rash.  Your weight is increasing each day over 2-3 days.  You gain 2 lb (1 kg) or more in a single day.  You have a fever. Get help right away if:  You develop chest pain that feels different from the pain caused by your incision.  You develop shortness of breath or difficulty breathing.  You have more redness, swelling, or pain around your incision.  You have more fluid or blood coming from your incision.  Your incision feels warm to the touch.  You have pus or a bad smell coming from your incision.  You feel  light-headed. This information is not intended to replace advice given to you by your health care provider. Make sure you discuss any questions you have with your health care provider. Document Released: 09/13/2004 Document Revised: 12/07/2015 Document Reviewed: 12/07/2015 Elsevier Interactive Patient Education  [employer]2018 [employer]

## 2017-03-08 NOTE — Progress Notes (Addendum)
[address]       [place] [id number]             [phone]      4 Days Post-Op Procedure(s) (LRB): MITRAL VALVE (MV) REPLACEMENT, MAZE PROCEDURE (N/A) MAZE (N/A) CLIPPING OF ATRIAL APPENDAGE (N/A) Subjective: Mild nausea and cough  Objective: Vital signs in last 24 hours: Temp:  [97.7 F (36.5 C)-98.9 F (37.2 C)] 98.8 F (37.1 C) (12/30 0424) Pulse Rate:  [70-77] 75 (12/30 0424) Cardiac Rhythm: A-V Sequential paced (12/30 0424) Resp:  [14-26] 26 (12/30 0424) BP: (83-148)/(57-87) 124/79 (12/30 0424) SpO2:  [90 %-100 %] 94 % (12/30 0424) FiO2 (%):  [50 %] 50 % (12/29 1657) Weight:  [162 lb 3.2 oz (73.6 kg)] 162 lb 3.2 oz (73.6 kg) (12/30 0155)  Hemodynamic parameters for last 24 hours:    Intake/Output from previous day: 12/29 0701 - 12/30 0700 In: 15 [I.V.:15] Out: 600 [Urine:600] Intake/Output this shift: No intake/output data recorded.  General appearance: alert, cooperative and no distress Heart: regular rate and rhythm and no murmur Lungs: dim left base, o/w clear Abdomen: benign Extremities: no edema Wound: dressing CDI  Lab Results: Recent Labs    03/07/17 0419 03/08/17 0257  WBC 6.6 7.4  HGB 9.3* 9.5*  HCT 28.0* 28.7*  PLT 85* 88*   BMET:  Recent Labs    03/07/17 0419 03/08/17 0257  NA 130* 129*  K 4.4 4.5  CL 94* 97*  CO2 26 25  GLUCOSE 102* 116*  BUN 10 11  CREATININE 0.81 0.84  CALCIUM 8.2* 8.3*    PT/INR:  Recent Labs    03/08/17 0257  LABPROT 14.0  INR 1.08   ABG    Component Value Date/Time   PHART 7.410 03/05/2017 1601   HCO3 22.7 03/05/2017 1601   TCO2 25 03/06/2017 1630   ACIDBASEDEF 2.0 03/05/2017 1601   O2SAT 95.0 03/05/2017 1601   CBG (last 3)  Recent Labs    03/07/17 0438 03/07/17 0824 03/07/17 1229  GLUCAP 97 100* 100*    Meds Scheduled Meds: . docusate sodium  200 mg Oral Daily  . escitalopram  20 mg Oral Daily  . furosemide  40 mg Oral Daily  . levothyroxine  100 mcg  Oral QAC breakfast  . mouth rinse  15 mL Mouth Rinse BID  . mometasone-formoterol  2 puff Inhalation BID  . pantoprazole  40 mg Oral QAC breakfast  . potassium chloride  20 mEq Oral Daily  . sodium chloride flush  3 mL Intravenous Q12H  . warfarin  5 mg Oral q1800  . Warfarin - Physician Dosing Inpatient   Does not apply q1800   Continuous Infusions: . sodium chloride     PRN Meds:.sodium chloride, acetaminophen, bisacodyl **OR** bisacodyl, ondansetron **OR** ondansetron (ZOFRAN) IV, oxyCODONE, sodium chloride flush, traMADol  Xrays Dg Chest Port 1 View  Result Date: 03/07/2017 CLINICAL DATA:  Status post mitral valve replacement. EXAM: PORTABLE CHEST 1 VIEW COMPARISON:  1 day prior FINDINGS: Removal of right IJ Cordis sheath. Removal of mediastinal drain and right chest tube. Midline trachea. Prior median sternotomy. Mitral valve repair. Cardiomegaly accentuated by AP portable technique. Atherosclerosis in the transverse aorta. Small left pleural effusion is similar. Trace right pleural fluid. No pneumothorax. Mild pulmonary venous congestion. Slight worsening left base airspace disease. Similar mild right base atelectasis. Left atrial appendage occlusion device. IMPRESSION: Persistent cardiomegaly with mild pulmonary venous congestion. Similar left pleural effusion with worsened adjacent  airspace disease. Similar right base atelectasis. Removal of support apparatus, without pneumothorax. Electronically Signed   By: Jeronimo Greaves M.D.   On: 03/07/2017 07:16    Assessment/Plan: S/P Procedure(s) (LRB): MITRAL VALVE (MV) REPLACEMENT, MAZE PROCEDURE (N/A) MAZE (N/A) CLIPPING OF ATRIAL APPENDAGE (N/A)   1 doing well overall 2 cont pulm toilet/resp rx, wean O2 off 3 hemodyn stable in sinus rhythm, monitor BP, systolic high at times but also low at times 4 cont coumadin- increase dose 5 cont gentle diuresis, watch sodium, does not appear to be significantly volume overloaded 6 H/H stable,  thrombocytopenia is stable- cont to monitor- no clinical evidence of bleeding 7 routine rehab  LOS: 9 days    Rowe Clack 03/08/2017   Chart reviewed, patient examined, agree with above. Weight is still 10 lbs over preop with left pleural effusion on CXR yesterday. Continue diuresis. INR has not bumped yet so Coumadin increased.

## 2017-03-09 LAB — GLUCOSE, CAPILLARY: Glucose-Capillary: 157 mg/dL — ABNORMAL HIGH (ref 65–99)

## 2017-03-09 LAB — BASIC METABOLIC PANEL
ANION GAP: 7 (ref 5–15)
BUN: 10 mg/dL (ref 6–20)
CHLORIDE: 100 mmol/L — AB (ref 101–111)
CO2: 27 mmol/L (ref 22–32)
Calcium: 8.5 mg/dL — ABNORMAL LOW (ref 8.9–10.3)
Creatinine, Ser: 0.8 mg/dL (ref 0.44–1.00)
GFR calc Af Amer: >60 mL/min (ref >60)
GFR calc non Af Amer: >60 mL/min (ref >60)
GLUCOSE: 103 mg/dL — AB (ref 65–99)
POTASSIUM: 4.5 mmol/L (ref 3.5–5.1)
Sodium: 134 mmol/L — ABNORMAL LOW (ref 135–145)

## 2017-03-09 LAB — IRON AND TIBC
IRON: 22 ug/dL — AB (ref 28–170)
SATURATION RATIOS: 7 % — AB (ref 10.4–31.8)
TIBC: 326 ug/dL (ref 250–450)
UIBC: 304 ug/dL

## 2017-03-09 LAB — CBC
HEMATOCRIT: 28 % — AB (ref 36.0–46.0)
Hemoglobin: 9.2 g/dL — ABNORMAL LOW (ref 12.0–15.0)
MCH: 30.9 pg (ref 26.0–34.0)
MCHC: 32.9 g/dL (ref 30.0–36.0)
MCV: 94 fL (ref 78.0–100.0)
PLATELETS: 115 10*3/uL — AB (ref 150–400)
RBC: 2.98 MIL/uL — AB (ref 3.87–5.11)
RDW: 14.5 % (ref 11.5–15.5)
WBC: 7.6 10*3/uL (ref 4.0–10.5)

## 2017-03-09 LAB — PROTIME-INR
INR: 1.12
Prothrombin Time: 14.3 seconds (ref 11.4–15.2)

## 2017-03-09 LAB — FERRITIN: FERRITIN: 76 ng/mL (ref 11–307)

## 2017-03-09 MED ORDER — NICOTINE 14 MG/24HR TD PT24
14.0000 mg | MEDICATED_PATCH | Freq: Every day | TRANSDERMAL | Status: DC
  Administered 2017-03-09 – 2017-03-11 (×3): 14 mg via TRANSDERMAL
  Filled 2017-03-09 (×3): qty 1

## 2017-03-09 MED ORDER — LISINOPRIL 5 MG PO TABS
5.0000 mg | ORAL_TABLET | Freq: Every day | ORAL | Status: DC
  Administered 2017-03-09 – 2017-03-11 (×3): 5 mg via ORAL
  Filled 2017-03-09 (×3): qty 1

## 2017-03-09 NOTE — Progress Notes (Signed)
Family Medicine Social Note  Patient doing well this am. Awake, alert.  Vitals:   03/08/17 2035 03/09/17 0445  BP:  122/79  Pulse: 70 74  Resp: 16 (!) 24  Temp:  98.9 F (37.2 C)  SpO2: 92% 99%   Agree with excellent care provided by CVTS and cardiology services. Will continue to follow socially, available if needed.  Myrene Buddy MD PGY-1 Family Medicine Resident

## 2017-03-09 NOTE — Progress Notes (Addendum)
      [address]       [place] [address]             [phone]      5 Days Post-Op Procedure(s) (LRB): MITRAL VALVE (MV) REPLACEMENT, MAZE PROCEDURE (N/A) MAZE (N/A) CLIPPING OF ATRIAL APPENDAGE (N/A)   Subjective:  No new complaints.  She continues to feel like she doesn't have much energy.  +ambulation  + BM  Objective: Vital signs in last 24 hours: Temp:  [97.8 F (36.6 C)-98.9 F (37.2 C)] 98.9 F (37.2 C) (12/31 0445) Pulse Rate:  [70-138] 74 (12/31 0445) Cardiac Rhythm: Heart block (12/30 1900) Resp:  [16-30] 24 (12/31 0445) BP: (122-137)/(79-95) 122/79 (12/31 0445) SpO2:  [81 %-100 %] 99 % (12/31 0445) Weight:  [159 lb 9.6 oz (72.4 kg)] 159 lb 9.6 oz (72.4 kg) (12/31 0200)  Intake/Output from previous day: 12/30 0701 - 12/31 0700 In: 360 [P.O.:360] Out: 1200 [Urine:1200]  General appearance: alert, cooperative and no distress Heart: regular rate and rhythm Lungs: clear to auscultation bilaterally Abdomen: soft, non-tender; bowel sounds normal; no masses,  no organomegaly Extremities: edema none appreciated Wound: clean and dry  Lab Results: Recent Labs    03/08/17 0257 03/09/17 0532  WBC 7.4 7.6  HGB 9.5* 9.2*  HCT 28.7* 28.0*  PLT 88* 115*   BMET:  Recent Labs    03/08/17 0257 03/09/17 0532  NA 129* 134*  K 4.5 4.5  CL 97* 100*  CO2 25 27  GLUCOSE 116* 103*  BUN 11 10  CREATININE 0.84 0.80  CALCIUM 8.3* 8.5*    PT/INR:  Recent Labs    03/09/17 0532  LABPROT 14.3  INR 1.12   ABG    Component Value Date/Time   PHART 7.410 03/05/2017 1601   HCO3 22.7 03/05/2017 1601   TCO2 25 03/06/2017 1630   ACIDBASEDEF 2.0 03/05/2017 1601   O2SAT 95.0 03/05/2017 1601   CBG (last 3)  Recent Labs    03/07/17 0438 03/07/17 0824 03/07/17 1229  GLUCAP 97 100* 100*    Assessment/Plan: S/P Procedure(s) (LRB): MITRAL VALVE (MV) REPLACEMENT, MAZE PROCEDURE (N/A) MAZE (N/A) CLIPPING OF ATRIAL APPENDAGE (N/A)  1. CV-  NSR, remains mildly hypertensive at times- will try low dose ACE  2. Pulm- no acute issues, continue IS 3. Renal- creatinine WNL, weight is trending down, no edema present 4. INR 1.12, repeat Coumadin at 7.5 mg daily 5. Deconditioning-patient lives alone, will need SNF at discharge 6. Dispo- patient stable, d/c EPW today, start low dose ACE, possibly ready for d/c in next 24-48 hours pending bed availabillity   LOS: 10 days    Erin Barrett 03/09/2017  wires out, incision clean and dry. L arm swelling minimal  INR low Needs to get INR > 1.5 before DC I will place face to face for [facility] INR draws patient examined and medical record reviewed,agree with above note. Kathlee Nations Trigt III 03/09/2017

## 2017-03-09 NOTE — Evaluation (Addendum)
Physical Therapy Evaluation Patient Details Name: Diane Nielsen MRN: [id number] DOB: Dec 01, 1955 Today's Date: 03/09/2017   History of Present Illness  61 Y/O F with PMX of hypothyroidism, HTN, and depression, presented with hx of SOB.  Underwent MVR with MAZe procedure and clipping of atrial appendage on 12/24.   Clinical Impression  Pt admitted with above diagnosis. Pt currently with functional limitations due to the deficits listed below (see PT Problem List). Pt ambulates well with RW with good safety awareness.  Pt was on RA and sats 88-92% on RA.  Pt would purse lip breathe and sats would stay above 90%.  Encouraged incentive spirometer.  Will need f/u and equipment as below.  Pt will benefit from skilled PT to increase their independence and safety with mobility to allow discharge to the venue listed below.    Follow Up Recommendations Home health PT;Supervision - Intermittent(HHOT)    Equipment Recommendations  Rolling walker with 5" wheels;3in1 (PT)    Recommendations for Other Services       Precautions / Restrictions Precautions Precautions: Fall;Sternal - reinforced sternal precautions Restrictions Weight Bearing Restrictions: No      Mobility  Bed Mobility Overal bed mobility: Independent                Transfers Overall transfer level: Needs assistance Equipment used: Rolling walker (2 wheeled) Transfers: Sit to/from Stand Sit to Stand: Supervision         General transfer comment: cues needed occasionally for technique to push up on LEs.   Ambulation/Gait Ambulation/Gait assistance: Min guard;Supervision Ambulation Distance (Feet): 200 Feet Assistive device: Rolling walker (2 wheeled);None Gait Pattern/deviations: Step-through pattern;Decreased stride length;Drifts right/left;Ataxic   Gait velocity interpretation: <1.8 ft/sec, indicative of risk for recurrent falls General Gait Details: Pt ambulated at first without device however somewhat  unsteady and tenous gait therefore obtained RW and pt did well with RW.    Stairs            Wheelchair Mobility    Modified Rankin (Stroke Patients Only)       Balance Overall balance assessment: Needs assistance Sitting-balance support: No upper extremity supported;Feet supported Sitting balance-Leahy Scale: Good     Standing balance support: Single extremity supported;During functional activity Standing balance-Leahy Scale: Fair Standing balance comment: can stand statically without device but needs device with challenges             High level balance activites: Direction changes;Turns;Sudden stops High Level Balance Comments: relies on RW and supervision             Pertinent Vitals/Pain Pain Assessment: No/denies pain    Home Living Family/patient expects to be discharged to:: Private residence Living Arrangements: Alone Available Help at Discharge: Friend(s);Available PRN/intermittently Type of Home: House Home Access: Stairs to enter Entrance Stairs-Rails: None Entrance Stairs-Number of Steps: 1 Home Layout: One level Home Equipment: Cane - single point      Prior Function Level of Independence: Independent               Hand Dominance        Extremity/Trunk Assessment   Upper Extremity Assessment Upper Extremity Assessment: Defer to OT evaluation    Lower Extremity Assessment Lower Extremity Assessment: Generalized weakness    Cervical / Trunk Assessment Cervical / Trunk Assessment: Normal  Communication   Communication: No difficulties  Cognition Arousal/Alertness: Awake/alert Behavior During Therapy: WFL for tasks assessed/performed Overall Cognitive Status: Within Functional Limits for tasks assessed  General Comments      Exercises     Assessment/Plan    PT Assessment Patient needs continued PT services  PT Problem List Decreased strength;Decreased  activity tolerance;Decreased balance;Decreased mobility;Decreased knowledge of use of DME;Decreased safety awareness;Decreased knowledge of precautions       PT Treatment Interventions DME instruction;Gait training;Functional mobility training;Therapeutic activities;Therapeutic exercise;Balance training;Patient/family education;Stair training    PT Goals (Current goals can be found in the Care Plan section)  Acute Rehab PT Goals Patient Stated Goal: to go home PT Goal Formulation: With patient Time For Goal Achievement: 03/23/17 Potential to Achieve Goals: Good    Frequency Min 3X/week   Barriers to discharge Decreased caregiver support      Co-evaluation               AM-PAC PT "6 Clicks" Daily Activity  Outcome Measure Difficulty turning over in bed (including adjusting bedclothes, sheets and blankets)?: None Difficulty moving from lying on back to sitting on the side of the bed? : None Difficulty sitting down on and standing up from a chair with arms (e.g., wheelchair, bedside commode, etc,.)?: None Help needed moving to and from a bed to chair (including a wheelchair)?: None Help needed walking in hospital room?: A Little Help needed climbing 3-5 steps with a railing? : A Little 6 Click Score: 22    End of Session Equipment Utilized During Treatment: Gait belt;Oxygen Activity Tolerance: Patient limited by fatigue Patient left: with call bell/phone within reach;in bed Nurse Communication: Mobility status PT Visit Diagnosis: Unsteadiness on feet (R26.81);Muscle weakness (generalized) (M62.81)    Time: [id number] PT Time Calculation (min) (ACUTE ONLY): 15 min   Charges:   PT Evaluation $PT Eval Moderate Complexity: 1 Mod     PT G Codes:        Webb Weed,PT Acute Rehabilitation [phone] (pager)   Berline Lopesawn F Shea Kapur 03/09/2017, 12:33 PM

## 2017-03-09 NOTE — Progress Notes (Signed)
CARDIAC REHAB PHASE I   PRE:  Rate/Rhythm: 78 SR    BP: sitting 121/70    SaO2: 91-94 RA  MODE:  Ambulation: 470 ft   POST:  Rate/Rhythm: 93 SR    BP: sitting 108/80     SaO2: 92-93 RA  Pt able to stand independently and walk with RW. I did instruct her not to use arm on recliner due to sternal precautions. Steady in hall with RW. Rest x2 for SOB and fatigue, SaO2 92-93 RA. Return to recliner. Sts she is tired but feels good. Ed completed with pt with excellent reception. She is planning on quitting smoking. She was thankful for the fake cigarette and resources given. Will refer to [place] [facility].  [phone]   Harriet Masson CES, ACSM 03/09/2017 9:16 AM

## 2017-03-09 NOTE — Care Management Note (Signed)
Case Management Note Diane Pierini RN, BSN Unit 4E-Case Manager-- 2H coverage [phone]  Patient Details  Name: Diane Nielsen MRN: [id number] Date of Birth: 29-May-1955  Subjective/Objective:  Pt admitted Acute decompensated heart failure with pulmonary edema: 2/2 to symptomatic severe mitral regurgitation-- now s/p MVR and MAZE on 03/04/17                 Action/Plan: PTA pt lived at home- anticipate return home- CM to follow pt progress post op.   Expected Discharge Date:  03/09/17               Expected Discharge Plan:  Home w Home Health Services  In-House Referral:     Discharge planning Services  CM Consult  Post Acute Care Choice:  Home Health, Durable Medical Equipment Choice offered to:  Patient  DME Arranged:  3-N-1, Walker rolling DME Agency:  [facility]  HH Arranged:  RN [facility] Agency:  [facility]  Status of Service:  Completed, signed off  If discussed at Long Length of Stay Meetings, dates discussed:   Discharge Disposition: home/home health    Additional Comments:  03/09/17- 1400- [name] RN, CM-  HHRN placed- spoke with pt at bedside- choice offered for [facility] agency in [facility]- per pt choice she would like to use [facility] for [facility] and DME needs- pt would like 3n1 and RW for home- per pt she has neighbors who will assist and look in on her- referral called to [name] with [facility] for [facility] and DME needs 3n1 and RW to be delivered to room prior to discharge.   [name], RN 03/09/2017, 2:03 PM

## 2017-03-09 NOTE — Progress Notes (Signed)
Ref: Patient, No Pcp Per   Subjective:  Feeling better. May like nicotine patch to continue smoking cessation. VS stable.  Objective:  Vital Signs in the last 24 hours: Temp:  [97.8 F (36.6 C)-98.9 F (37.2 C)] 98.9 F (37.2 C) (12/31 0445) Pulse Rate:  [70-138] 81 (12/31 5035) Cardiac Rhythm: Normal sinus rhythm;Heart block (12/31 0945) Resp:  [16-30] 21 (12/31 0942) BP: (122-137)/(70-95) 136/70 (12/31 0942) SpO2:  [81 %-100 %] 98 % (12/31 0808) Weight:  [72.4 kg (159 lb 9.6 oz)] 72.4 kg (159 lb 9.6 oz) (12/31 0200)  Physical Exam: BP Readings from Last 1 Encounters:  03/09/17 136/70     Wt Readings from Last 1 Encounters:  03/09/17 72.4 kg (159 lb 9.6 oz)    Weight change: -1.179 kg (-9.6 oz) Body mass index is 26.56 kg/m. HEENT: [place]/AT, Eyes-Blue, PERL, EOMI, Conjunctiva-Pale pink, Sclera-Non-icteric Neck: No JVD, No bruit, Trachea midline. Lungs:  Clear, Bilateral. Midline surgical scar healing well. Cardiac:  Regular rhythm, normal S1 and S2, no S3. II/VI systolic murmur. Abdomen:  Soft, non-tender. BS present. Extremities:  No edema present. No cyanosis. No clubbing. CNS: AxOx3, Cranial nerves grossly intact, moves all 4 extremities.  Skin: Warm and dry.   Intake/Output from previous day: 12/30 0701 - 12/31 0700 In: 360 [P.O.:360] Out: 1200 [Urine:1200]    Lab Results: BMET    Component Value Date/Time   NA 134 (L) 03/09/2017 0532   NA 129 (L) 03/08/2017 0257   NA 130 (L) 03/07/2017 0419   K 4.5 03/09/2017 0532   K 4.5 03/08/2017 0257   K 4.4 03/07/2017 0419   CL 100 (L) 03/09/2017 0532   CL 97 (L) 03/08/2017 0257   CL 94 (L) 03/07/2017 0419   CO2 27 03/09/2017 0532   CO2 25 03/08/2017 0257   CO2 26 03/07/2017 0419   GLUCOSE 103 (H) 03/09/2017 0532   GLUCOSE 116 (H) 03/08/2017 0257   GLUCOSE 102 (H) 03/07/2017 0419   BUN 10 03/09/2017 0532   BUN 11 03/08/2017 0257   BUN 10 03/07/2017 0419   CREATININE 0.80 03/09/2017 0532   CREATININE 0.84  03/08/2017 0257   CREATININE 0.81 03/07/2017 0419   CALCIUM 8.5 (L) 03/09/2017 0532   CALCIUM 8.3 (L) 03/08/2017 0257   CALCIUM 8.2 (L) 03/07/2017 0419   GFRNONAA >60 03/09/2017 0532   GFRNONAA >60 03/08/2017 0257   GFRNONAA >60 03/07/2017 0419   GFRAA >60 03/09/2017 0532   GFRAA >60 03/08/2017 0257   GFRAA >60 03/07/2017 0419   CBC    Component Value Date/Time   WBC 7.6 03/09/2017 0532   RBC 2.98 (L) 03/09/2017 0532   HGB 9.2 (L) 03/09/2017 0532   HCT 28.0 (L) 03/09/2017 0532   PLT 115 (L) 03/09/2017 0532   MCV 94.0 03/09/2017 0532   MCH 30.9 03/09/2017 0532   MCHC 32.9 03/09/2017 0532   RDW 14.5 03/09/2017 0532   LYMPHSABS 6.0 (H) 02/27/2017 1300   MONOABS 1.0 02/27/2017 1300   EOSABS 0.3 02/27/2017 1300   BASOSABS 0.0 02/27/2017 1300   HEPATIC Function Panel Recent Labs    02/28/17 0633 03/01/17 0506 03/02/17 0550  PROT 6.3* 6.1* 5.8*   HEMOGLOBIN A1C No components found for: HGA1C,  MPG CARDIAC ENZYMES Lab Results  Component Value Date   TROPONINI 0.03 (HH) 03/01/2017   TROPONINI 0.04 (HH) 03/01/2017   BNP No results for input(s): PROBNP in the last 8760 hours. TSH Recent Labs    02/28/17 0633  TSH 2.548  CHOLESTEROL Recent Labs    02/28/17 0633  CHOL 136    Scheduled Meds: . docusate sodium  200 mg Oral Daily  . escitalopram  20 mg Oral Daily  . furosemide  40 mg Oral Daily  . levothyroxine  100 mcg Oral QAC breakfast  . lisinopril  5 mg Oral Daily  . mouth rinse  15 mL Mouth Rinse BID  . mometasone-formoterol  2 puff Inhalation BID  . pantoprazole  40 mg Oral QAC breakfast  . potassium chloride  20 mEq Oral Daily  . sodium chloride flush  3 mL Intravenous Q12H  . warfarin  7.5 mg Oral q1800  . warfarin   Does not apply Once  . Warfarin - Physician Dosing Inpatient   Does not apply q1800   Continuous Infusions: . sodium chloride     PRN Meds:.sodium chloride, acetaminophen, bisacodyl **OR** bisacodyl, ondansetron **OR** ondansetron  (ZOFRAN) IV, oxyCODONE, sodium chloride flush, traMADol  Assessment/Plan: S/P MV replacement for severe MR. Maze procedure and clipping of left atrial appendage. Paroxysmal atrial fibrillation Hypothyroidism Tobacco use disorder Anemia of blood loss Pulmonary hypertension Hyponatremia- improving  Nicotine patch. Increase activity.   LOS: 10 days    Orpah CobbAjay Marijane Trower  MD  03/09/2017, 10:19 AM

## 2017-03-10 LAB — PROTIME-INR
INR: 1.38
PROTHROMBIN TIME: 16.8 s — AB (ref 11.4–15.2)

## 2017-03-10 NOTE — Plan of Care (Signed)
  Progressing Pain Managment: General experience of comfort will improve 03/10/2017 2024 - Progressing by Ruel Favors, RN Skin Integrity: Risk for impaired skin integrity will decrease 03/10/2017 2024 - Progressing by Ruel Favors, RN Activity: Risk for activity intolerance will decrease 03/10/2017 2024 - Progressing by Ruel Favors, RN Cardiac: Hemodynamic stability will improve 03/10/2017 2024 - Progressing by Ruel Favors, RN Clinical Measurements: Postoperative complications will be avoided or minimized 03/10/2017 2024 - Progressing by Ruel Favors, RN

## 2017-03-10 NOTE — Progress Notes (Signed)
Patient sleeping and was called by [employer]. That patient had gone into At. Fib 80-100. R.N. Aware Patient has hx of At. Fib this adm. Cont. To monitor patient and rhythm

## 2017-03-10 NOTE — Progress Notes (Addendum)
Patient back in S.R. 1st degree heart block 70-80's R.N. Aware.

## 2017-03-10 NOTE — Progress Notes (Signed)
FPTS Social Note   Continues to do well. Awake and alert.  Vitals:   03/10/17 0433 03/10/17 0753  BP: 129/69   Pulse: 78 (!) 105  Resp: 18 16  Temp: 98.5 F (36.9 C)   SpO2: 99% 94%   Agree with excellent care provided by CVTS and cardiology services. Will continue to follow socially, available if needed.  Durward Parcel, DO [facility] Family Medicine, PGY-2

## 2017-03-10 NOTE — Progress Notes (Addendum)
      [address]       [place] [address]             [phone]      6 Days Post-Op Procedure(s) (LRB): MITRAL VALVE (MV) REPLACEMENT, MAZE PROCEDURE (N/A) MAZE (N/A) CLIPPING OF ATRIAL APPENDAGE (N/A)   Subjective:  Patient has no complaints.  Feeling pretty good.  Objective: Vital signs in last 24 hours: Temp:  [98.5 F (36.9 C)-98.8 F (37.1 C)] 98.5 F (36.9 C) (01/01 0433) Pulse Rate:  [78-99] 78 (01/01 0433) Cardiac Rhythm: Heart block;Normal sinus rhythm;Other (Comment) (01/01 0419) Resp:  [17-21] 18 (01/01 0433) BP: (110-136)/(68-72) 129/69 (01/01 0433) SpO2:  [92 %-99 %] 99 % (01/01 0433) Weight:  [159 lb 9.6 oz (72.4 kg)] 159 lb 9.6 oz (72.4 kg) (01/01 0433)  Intake/Output from previous day: 12/31 0701 - 01/01 0700 In: 240 [P.O.:240] Out: -   General appearance: alert, cooperative and no distress Heart: regular rate and rhythm Lungs: clear to auscultation bilaterally Abdomen: soft, non-tender; bowel sounds normal; no masses,  no organomegaly Extremities: edema none appreciated Wound: clean and dry  Lab Results: Recent Labs    03/08/17 0257 03/09/17 0532  WBC 7.4 7.6  HGB 9.5* 9.2*  HCT 28.7* 28.0*  PLT 88* 115*   BMET:  Recent Labs    03/08/17 0257 03/09/17 0532  NA 129* 134*  K 4.5 4.5  CL 97* 100*  CO2 25 27  GLUCOSE 116* 103*  BUN 11 10  CREATININE 0.84 0.80  CALCIUM 8.3* 8.5*    PT/INR:  Recent Labs    03/10/17 0452  LABPROT 16.8*  INR 1.38   ABG    Component Value Date/Time   PHART 7.410 03/05/2017 1601   HCO3 22.7 03/05/2017 1601   TCO2 25 03/06/2017 1630   ACIDBASEDEF 2.0 03/05/2017 1601   O2SAT 95.0 03/05/2017 1601   CBG (last 3)  Recent Labs    03/07/17 0824 03/07/17 1229  GLUCAP 100* 100*    Assessment/Plan: S/P Procedure(s) (LRB): MITRAL VALVE (MV) REPLACEMENT, MAZE PROCEDURE (N/A) MAZE (N/A) CLIPPING OF ATRIAL APPENDAGE (N/A)  1. CV- NSR with 1st degree AV Block, Brief A. Fib  yesterday, HTN improved-continue Lisinopril, will speak with staff about BB 2. INR 1.38, will continue coumadin at 7.5 mg daily 3. Renal- weight trending down, continue Lasix 4. Dispo- patient stable, BP improved with ACE, brief A. Fib yesterday, in NSR with 1st degree AV block, not on BB will discuss with staff, continue coumadin,  Likely home in AM if INR is >1.5   LOS: 11 days    Diane Nielsen 03/10/2017  Stable, brief afib yesterday , with 1st degree heart block and slow rate will not start beta blocker yet  I have seen and examined Diane Nielsen and agree with the above assessment  and plan.  Delight Ovens MD [facility] [phone] Office [phone] 03/10/2017 10:06 AM

## 2017-03-10 NOTE — Progress Notes (Signed)
Ref: Patient, No Pcp Per   Subjective:  On nicotine patch. VS stable.  Objective:  Vital Signs in the last 24 hours: Temp:  [98.5 F (36.9 C)-98.8 F (37.1 C)] 98.5 F (36.9 C) (01/01 0433) Pulse Rate:  [78-105] 105 (01/01 0753) Cardiac Rhythm: Heart block (01/01 0700) Resp:  [16-20] 16 (01/01 0753) BP: (110-129)/(68-72) 129/69 (01/01 0433) SpO2:  [92 %-99 %] 94 % (01/01 0753) Weight:  [72.4 kg (159 lb 9.6 oz)] 72.4 kg (159 lb 9.6 oz) (01/01 0433)  Physical Exam: BP Readings from Last 1 Encounters:  03/10/17 129/69     Wt Readings from Last 1 Encounters:  03/10/17 72.4 kg (159 lb 9.6 oz)    Weight change: 0 kg (0 lb) Body mass index is 26.56 kg/m. HEENT: [place]/AT, Eyes-Blue, PERL, EOMI, Conjunctiva-Pink, Sclera-Non-icteric Neck: No JVD, No bruit, Trachea midline. Lungs:  Clear, Bilateral. Midline surgical scar. Cardiac:  Regular rhythm, normal S1 and S2, no S3. II/VI systolic murmur. Abdomen:  Soft, non-tender. BS present. Extremities:  No edema present. No cyanosis. No clubbing. CNS: AxOx3, Cranial nerves grossly intact, moves all 4 extremities.  Skin: Warm and dry.   Intake/Output from previous day: 12/31 0701 - 01/01 0700 In: 240 [P.O.:240] Out: -     Lab Results: BMET    Component Value Date/Time   NA 134 (L) 03/09/2017 0532   NA 129 (L) 03/08/2017 0257   NA 130 (L) 03/07/2017 0419   K 4.5 03/09/2017 0532   K 4.5 03/08/2017 0257   K 4.4 03/07/2017 0419   CL 100 (L) 03/09/2017 0532   CL 97 (L) 03/08/2017 0257   CL 94 (L) 03/07/2017 0419   CO2 27 03/09/2017 0532   CO2 25 03/08/2017 0257   CO2 26 03/07/2017 0419   GLUCOSE 103 (H) 03/09/2017 0532   GLUCOSE 116 (H) 03/08/2017 0257   GLUCOSE 102 (H) 03/07/2017 0419   BUN 10 03/09/2017 0532   BUN 11 03/08/2017 0257   BUN 10 03/07/2017 0419   CREATININE 0.80 03/09/2017 0532   CREATININE 0.84 03/08/2017 0257   CREATININE 0.81 03/07/2017 0419   CALCIUM 8.5 (L) 03/09/2017 0532   CALCIUM 8.3 (L) 03/08/2017  0257   CALCIUM 8.2 (L) 03/07/2017 0419   GFRNONAA >60 03/09/2017 0532   GFRNONAA >60 03/08/2017 0257   GFRNONAA >60 03/07/2017 0419   GFRAA >60 03/09/2017 0532   GFRAA >60 03/08/2017 0257   GFRAA >60 03/07/2017 0419   CBC    Component Value Date/Time   WBC 7.6 03/09/2017 0532   RBC 2.98 (L) 03/09/2017 0532   HGB 9.2 (L) 03/09/2017 0532   HCT 28.0 (L) 03/09/2017 0532   PLT 115 (L) 03/09/2017 0532   MCV 94.0 03/09/2017 0532   MCH 30.9 03/09/2017 0532   MCHC 32.9 03/09/2017 0532   RDW 14.5 03/09/2017 0532   LYMPHSABS 6.0 (H) 02/27/2017 1300   MONOABS 1.0 02/27/2017 1300   EOSABS 0.3 02/27/2017 1300   BASOSABS 0.0 02/27/2017 1300   HEPATIC Function Panel Recent Labs    02/28/17 0633 03/01/17 0506 03/02/17 0550  PROT 6.3* 6.1* 5.8*   HEMOGLOBIN A1C No components found for: HGA1C,  MPG CARDIAC ENZYMES Lab Results  Component Value Date   TROPONINI 0.03 (HH) 03/01/2017   TROPONINI 0.04 (HH) 03/01/2017   BNP No results for input(s): PROBNP in the last 8760 hours. TSH Recent Labs    02/28/17 0633  TSH 2.548   CHOLESTEROL Recent Labs    02/28/17 0633  CHOL 136  Scheduled Meds: . docusate sodium  200 mg Oral Daily  . escitalopram  20 mg Oral Daily  . furosemide  40 mg Oral Daily  . levothyroxine  100 mcg Oral QAC breakfast  . lisinopril  5 mg Oral Daily  . mouth rinse  15 mL Mouth Rinse BID  . mometasone-formoterol  2 puff Inhalation BID  . nicotine  14 mg Transdermal Daily  . pantoprazole  40 mg Oral QAC breakfast  . potassium chloride  20 mEq Oral Daily  . sodium chloride flush  3 mL Intravenous Q12H  . warfarin  7.5 mg Oral q1800  . warfarin   Does not apply Once  . Warfarin - Physician Dosing Inpatient   Does not apply q1800   Continuous Infusions: . 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  Follow-up by Dr. Sharyn Lull in 2 weeks.   LOS: 11 days    Orpah Cobb  MD  03/10/2017, 11:56 AM

## 2017-03-11 LAB — PROTIME-INR
INR: 1.66
Prothrombin Time: 19.4 seconds — ABNORMAL HIGH (ref 11.4–15.2)

## 2017-03-11 MED ORDER — POTASSIUM CHLORIDE CRYS ER 20 MEQ PO TBCR: 20.0000 meq | EXTENDED_RELEASE_TABLET | Freq: Every day | ORAL | 0 refills | Status: DC

## 2017-03-11 MED ORDER — LISINOPRIL 5 MG PO TABS: 5.0000 mg | ORAL_TABLET | Freq: Every day | ORAL | 3 refills | Status: DC

## 2017-03-11 MED ORDER — NICOTINE 14 MG/24HR TD PT24: 14.0000 mg | MEDICATED_PATCH | Freq: Every day | TRANSDERMAL | 0 refills | Status: DC

## 2017-03-11 MED ORDER — WARFARIN SODIUM 7.5 MG PO TABS
7.5000 mg | ORAL_TABLET | Freq: Once | ORAL | Status: AC
  Administered 2017-03-11: 7.5 mg via ORAL

## 2017-03-11 MED ORDER — WARFARIN SODIUM 7.5 MG PO TABS: 7.5000 mg | ORAL_TABLET | Freq: Every day | ORAL | 3 refills | Status: DC

## 2017-03-11 MED ORDER — MOMETASONE FURO-FORMOTEROL FUM 200-5 MCG/ACT IN AERO: 2.0000 | INHALATION_SPRAY | Freq: Two times a day (BID) | RESPIRATORY_TRACT | 12 refills | Status: DC

## 2017-03-11 MED ORDER — FUROSEMIDE 40 MG PO TABS: 40.0000 mg | ORAL_TABLET | Freq: Every day | ORAL | 0 refills | Status: DC

## 2017-03-11 MED ORDER — OXYCODONE HCL 5 MG PO TABS: 5.0000 mg | ORAL_TABLET | ORAL | 0 refills | Status: DC | PRN

## 2017-03-11 NOTE — Progress Notes (Addendum)
      [address]       [place] [address]             [phone]      7 Days Post-Op Procedure(s) (LRB): MITRAL VALVE (MV) REPLACEMENT, MAZE PROCEDURE (N/A) MAZE (N/A) CLIPPING OF ATRIAL APPENDAGE (N/A)   Subjective:  No new complaints.  Feels good.  Happy to be going home.  Objective: Vital signs in last 24 hours: Temp:  [98.3 F (36.8 C)-98.6 F (37 C)] 98.4 F (36.9 C) (01/02 0459) Pulse Rate:  [74-105] 80 (01/02 0459) Cardiac Rhythm: Normal sinus rhythm;Heart block (01/02 0459) Resp:  [13-16] 16 (01/02 0459) BP: (130-138)/(71-86) 138/74 (01/02 0459) SpO2:  [90 %-99 %] 94 % (01/02 0459) Weight:  [160 lb 11.2 oz (72.9 kg)] 160 lb 11.2 oz (72.9 kg) (01/02 0459)  Intake/Output from previous day: 01/01 0701 - 01/02 0700 In: 1287 [P.O.:1284; I.V.:3] Out: -   General appearance: alert, cooperative and no distress Heart: regular rate and rhythm Lungs: clear to auscultation bilaterally Abdomen: soft, non-tender; bowel sounds normal; no masses,  no organomegaly Extremities: edema trace Wound: clean and dry  Lab Results: Recent Labs    03/09/17 0532  WBC 7.6  HGB 9.2*  HCT 28.0*  PLT 115*   BMET:  Recent Labs    03/09/17 0532  NA 134*  K 4.5  CL 100*  CO2 27  GLUCOSE 103*  BUN 10  CREATININE 0.80  CALCIUM 8.5*    PT/INR:  Recent Labs    03/11/17 0307  LABPROT 19.4*  INR 1.66   ABG    Component Value Date/Time   PHART 7.410 03/05/2017 1601   HCO3 22.7 03/05/2017 1601   TCO2 25 03/06/2017 1630   ACIDBASEDEF 2.0 03/05/2017 1601   O2SAT 95.0 03/05/2017 1601   CBG (last 3)  No results for input(s): GLUCAP in the last 72 hours.  Assessment/Plan: S/P Procedure(s) (LRB): MITRAL VALVE (MV) REPLACEMENT, MAZE PROCEDURE (N/A) MAZE (N/A) CLIPPING OF ATRIAL APPENDAGE (N/A)  1. CV- NSR- no further A. Fib- continue Amiodarone, Lisinopril 2. Pulm- no acute issues, continue IS 3. INR 1.66, continue Coumadin at 7.5 4. Renal-  creatinine stable, will taper lasix over next several days 5. Dispo- patient stable, INR trending up will continue coumadin at 7.5 mg daily, will d/c home today   LOS: 12 days    Lowella Dandy 03/11/2017   patient examined and medical record reviewed,agree with above note. Kathlee Nations Trigt III 03/11/2017

## 2017-03-11 NOTE — Progress Notes (Signed)
Spoke with Jiles Crocker CM. DME equipment to be delivered to home by [facility].   Leonidas Romberg, RN

## 2017-03-11 NOTE — Progress Notes (Signed)
Patient continues to do well. Noted that CVTS plans to discharge today. Agree with excellent care provided by CVTS and cardiology services.   Marcy Siren, D.O. 03/11/2017, 8:23 AM PGY-3, [facility]

## 2017-03-11 NOTE — Progress Notes (Signed)
Midline removed, catheter intact, site clean, dry, intact. Chest tube sutures removed - benzoin and steri strips applied.  Leonidas Romberg, RN

## 2017-03-12 DIAGNOSIS — Z48812 Encounter for surgical aftercare following surgery on the circulatory system: Secondary | ICD-10-CM | POA: Diagnosis not present

## 2017-03-13 ENCOUNTER — Telehealth (HOSPITAL_COMMUNITY): Payer: Self-pay

## 2017-03-13 ENCOUNTER — Telehealth: Payer: Self-pay

## 2017-03-13 ENCOUNTER — Ambulatory Visit: Payer: PRIVATE HEALTH INSURANCE

## 2017-03-13 ENCOUNTER — Inpatient Hospital Stay
Admission: RE | Admit: 2017-03-13 | Discharge: 2017-03-13 | Disposition: A | Discharge status: Home / Self Care | Payer: PRIVATE HEALTH INSURANCE | Source: Ambulatory Visit | Attending: Family Medicine | Admitting: Family Medicine

## 2017-03-13 DIAGNOSIS — I4891 Unspecified atrial fibrillation: Secondary | ICD-10-CM | POA: Insufficient documentation

## 2017-03-13 NOTE — Telephone Encounter (Signed)
Contacted patient to let her know that the medication she was put on at discharge from the hospital has been denied by insurance.  Told her that I would be contacting the provider to see if there was an alternative available.  If there was she would get a call to pick up this medication from her pharmacy.  She acknowledged receipt.

## 2017-03-13 NOTE — Telephone Encounter (Signed)
Patients insurance is active and benefits verified through [facility] - No co-pay, deductible amount of $1,000/$0.00 has been met, out of pocket amount of $5,000/$0.00 has been met, 20% co-insurance, and no pre-authorization is required. Medcost reference is Precilla B 03/13/2017.   Patient will be contacted and scheduled after their follow up appt with the Cardiologist office upon review by the RN Navigator.

## 2017-03-25 ENCOUNTER — Telehealth (HOSPITAL_COMMUNITY): Payer: Self-pay

## 2017-03-25 NOTE — Telephone Encounter (Signed)
Faxed over office notes request for appt 03/20/2017 to Dr.Harwani.

## 2017-04-01 ENCOUNTER — Other Ambulatory Visit: Payer: Self-pay

## 2017-04-01 ENCOUNTER — Encounter (HOSPITAL_COMMUNITY): Payer: Self-pay

## 2017-04-01 ENCOUNTER — Emergency Department (HOSPITAL_COMMUNITY): Payer: PRIVATE HEALTH INSURANCE

## 2017-04-01 ENCOUNTER — Observation Stay (HOSPITAL_COMMUNITY)
Admission: EM | Admit: 2017-04-01 | Discharge: 2017-04-02 | Disposition: A | Discharge status: Home / Self Care | Payer: PRIVATE HEALTH INSURANCE | Attending: Cardiology | Admitting: Cardiology

## 2017-04-01 DIAGNOSIS — Z7901 Long term (current) use of anticoagulants: Secondary | ICD-10-CM | POA: Diagnosis not present

## 2017-04-01 DIAGNOSIS — F17200 Nicotine dependence, unspecified, uncomplicated: Secondary | ICD-10-CM | POA: Insufficient documentation

## 2017-04-01 DIAGNOSIS — E039 Hypothyroidism, unspecified: Secondary | ICD-10-CM | POA: Insufficient documentation

## 2017-04-01 DIAGNOSIS — I2510 Atherosclerotic heart disease of native coronary artery without angina pectoris: Secondary | ICD-10-CM | POA: Diagnosis not present

## 2017-04-01 DIAGNOSIS — F329 Major depressive disorder, single episode, unspecified: Secondary | ICD-10-CM | POA: Diagnosis not present

## 2017-04-01 DIAGNOSIS — I509 Heart failure, unspecified: Secondary | ICD-10-CM | POA: Insufficient documentation

## 2017-04-01 DIAGNOSIS — I4892 Unspecified atrial flutter: Secondary | ICD-10-CM | POA: Diagnosis not present

## 2017-04-01 DIAGNOSIS — Z79899 Other long term (current) drug therapy: Secondary | ICD-10-CM | POA: Diagnosis not present

## 2017-04-01 DIAGNOSIS — R5383 Other fatigue: Secondary | ICD-10-CM | POA: Diagnosis present

## 2017-04-01 LAB — BASIC METABOLIC PANEL
Anion gap: 12 (ref 5–15)
BUN: 18 mg/dL (ref 6–20)
CHLORIDE: 102 mmol/L (ref 101–111)
CO2: 19 mmol/L — ABNORMAL LOW (ref 22–32)
Calcium: 8.8 mg/dL — ABNORMAL LOW (ref 8.9–10.3)
Creatinine, Ser: 0.99 mg/dL (ref 0.44–1.00)
GFR calc Af Amer: >60 mL/min (ref >60)
GFR calc non Af Amer: >60 mL/min (ref >60)
GLUCOSE: 102 mg/dL — AB (ref 65–99)
POTASSIUM: 4 mmol/L (ref 3.5–5.1)
Sodium: 133 mmol/L — ABNORMAL LOW (ref 135–145)

## 2017-04-01 LAB — I-STAT CHEM 8, ED
BUN: 25 mg/dL — AB (ref 6–20)
CALCIUM ION: 1.08 mmol/L — AB (ref 1.15–1.40)
CHLORIDE: 101 mmol/L (ref 101–111)
CREATININE: 0.9 mg/dL (ref 0.44–1.00)
GLUCOSE: 107 mg/dL — AB (ref 65–99)
HCT: 40 % (ref 36.0–46.0)
Hemoglobin: 13.6 g/dL (ref 12.0–15.0)
Potassium: 4.6 mmol/L (ref 3.5–5.1)
Sodium: 132 mmol/L — ABNORMAL LOW (ref 135–145)
TCO2: 22 mmol/L (ref 22–32)

## 2017-04-01 LAB — CBC WITH DIFFERENTIAL/PLATELET
Basophils Absolute: 0 10*3/uL (ref 0.0–0.1)
Basophils Relative: 0 %
EOS PCT: 1 %
Eosinophils Absolute: 0.1 10*3/uL (ref 0.0–0.7)
HCT: 36.1 % (ref 36.0–46.0)
HEMOGLOBIN: 11.8 g/dL — AB (ref 12.0–15.0)
LYMPHS ABS: 1.5 10*3/uL (ref 0.7–4.0)
LYMPHS PCT: 16 %
MCH: 30.1 pg (ref 26.0–34.0)
MCHC: 32.7 g/dL (ref 30.0–36.0)
MCV: 92.1 fL (ref 78.0–100.0)
MONOS PCT: 7 %
Monocytes Absolute: 0.6 10*3/uL (ref 0.1–1.0)
NEUTROS PCT: 76 %
Neutro Abs: 7.3 10*3/uL (ref 1.7–7.7)
Platelets: 126 10*3/uL — ABNORMAL LOW (ref 150–400)
RBC: 3.92 MIL/uL (ref 3.87–5.11)
RDW: 14.6 % (ref 11.5–15.5)
WBC: 9.7 10*3/uL (ref 4.0–10.5)

## 2017-04-01 LAB — PROTIME-INR
INR: 2.62
Prothrombin Time: 27.8 seconds — ABNORMAL HIGH (ref 11.4–15.2)

## 2017-04-01 LAB — MAGNESIUM: Magnesium: 1.9 mg/dL (ref 1.7–2.4)

## 2017-04-01 LAB — TSH: TSH: 5.594 u[IU]/mL — ABNORMAL HIGH (ref 0.350–4.500)

## 2017-04-01 MED ORDER — DILTIAZEM LOAD VIA INFUSION
10.0000 mg | Freq: Once | INTRAVENOUS | Status: AC
  Administered 2017-04-01: 10 mg via INTRAVENOUS
  Filled 2017-04-01: qty 10

## 2017-04-01 MED ORDER — ASPIRIN 81 MG PO CHEW
324.0000 mg | CHEWABLE_TABLET | ORAL | Status: AC
  Administered 2017-04-01: 324 mg via ORAL
  Filled 2017-04-01: qty 4

## 2017-04-01 MED ORDER — ASPIRIN EC 81 MG PO TBEC
81.0000 mg | DELAYED_RELEASE_TABLET | Freq: Every day | ORAL | Status: DC
  Administered 2017-04-02: 81 mg via ORAL
  Filled 2017-04-01: qty 1

## 2017-04-01 MED ORDER — SODIUM CHLORIDE 0.9 % IV BOLUS (SEPSIS)
500.0000 mL | Freq: Once | INTRAVENOUS | Status: AC
  Administered 2017-04-01: 500 mL via INTRAVENOUS

## 2017-04-01 MED ORDER — SODIUM CHLORIDE 0.9 % IV SOLN
INTRAVENOUS | Status: DC
  Administered 2017-04-01 – 2017-04-02 (×2): via INTRAVENOUS

## 2017-04-01 MED ORDER — SODIUM CHLORIDE 0.9 % IV SOLN
INTRAVENOUS | Status: DC
  Administered 2017-04-01: 15:00:00 via INTRAVENOUS

## 2017-04-01 MED ORDER — DILTIAZEM HCL-DEXTROSE 100-5 MG/100ML-% IV SOLN (PREMIX)
5.0000 mg/h | INTRAVENOUS | Status: DC
  Administered 2017-04-01: 5 mg/h via INTRAVENOUS
  Filled 2017-04-01: qty 100

## 2017-04-01 MED ORDER — MOMETASONE FURO-FORMOTEROL FUM 200-5 MCG/ACT IN AERO
2.0000 | INHALATION_SPRAY | Freq: Two times a day (BID) | RESPIRATORY_TRACT | Status: DC
  Administered 2017-04-01 – 2017-04-02 (×2): 2 via RESPIRATORY_TRACT
  Filled 2017-04-01: qty 8.8

## 2017-04-01 MED ORDER — WARFARIN SODIUM 5 MG PO TABS
5.0000 mg | ORAL_TABLET | Freq: Every day | ORAL | Status: DC
  Administered 2017-04-02: 5 mg via ORAL
  Filled 2017-04-01 (×2): qty 1

## 2017-04-01 MED ORDER — NITROGLYCERIN 0.4 MG SL SUBL: 0.4000 mg | SUBLINGUAL_TABLET | SUBLINGUAL | Status: DC | PRN

## 2017-04-01 MED ORDER — AMIODARONE IV BOLUS ONLY 150 MG/100ML
150.0000 mg | Freq: Once | INTRAVENOUS | Status: AC
  Administered 2017-04-01: 150 mg via INTRAVENOUS
  Filled 2017-04-01: qty 100

## 2017-04-01 MED ORDER — ESCITALOPRAM OXALATE 10 MG PO TABS
20.0000 mg | ORAL_TABLET | Freq: Every day | ORAL | Status: DC
  Administered 2017-04-01: 20 mg via ORAL
  Filled 2017-04-01: qty 2

## 2017-04-01 MED ORDER — LEVOTHYROXINE SODIUM 100 MCG PO TABS
100.0000 ug | ORAL_TABLET | Freq: Every day | ORAL | Status: DC
  Administered 2017-04-02: 100 ug via ORAL
  Filled 2017-04-01: qty 1

## 2017-04-01 MED ORDER — ACETAMINOPHEN 325 MG PO TABS: 650.0000 mg | ORAL_TABLET | Freq: Four times a day (QID) | ORAL | Status: DC | PRN

## 2017-04-01 MED ORDER — AMIODARONE HCL IN DEXTROSE 360-4.14 MG/200ML-% IV SOLN
60.0000 mg/h | INTRAVENOUS | Status: AC
  Administered 2017-04-01 (×2): 60 mg/h via INTRAVENOUS
  Filled 2017-04-01 (×2): qty 200

## 2017-04-01 MED ORDER — AMIODARONE HCL IN DEXTROSE 360-4.14 MG/200ML-% IV SOLN
30.0000 mg/h | INTRAVENOUS | Status: DC
  Administered 2017-04-02: 30 mg/h via INTRAVENOUS
  Filled 2017-04-01: qty 200

## 2017-04-01 MED ORDER — ASPIRIN 300 MG RE SUPP: 300.0000 mg | RECTAL | Status: AC

## 2017-04-01 MED ORDER — WARFARIN - PHARMACIST DOSING INPATIENT: Freq: Every day | Status: DC

## 2017-04-01 NOTE — ED Notes (Signed)
Dr. Zackowski at bedside  

## 2017-04-01 NOTE — ED Triage Notes (Signed)
PT went into cards office for scheduled EKG and found to be in atrial flutter. Pt denies CP, sob, dizziness. A&Ox4.

## 2017-04-01 NOTE — ED Notes (Signed)
PAGED HARWANI TO Seward Grater, RN

## 2017-04-01 NOTE — ED Provider Notes (Signed)
[facility] EMERGENCY DEPARTMENT Provider Note   CSN: [id number] Arrival date & time: 04/01/17  1413     History   Chief Complaint No chief complaint on file.   HPI Diane Nielsen is a 62 y.o. female.  Patient status post mitral valve replacement with a pig valve.  Patient is on Coumadin.  Patient has been feeling weak and fatigued and feeling that her heart is been going fast at times.  Patient had follow-up with her cardiologist Dr. Sharyn Lull for an echocardiogram today.  Noted to have a rapid heart rate which seemed to be in atrial flutter 2-1 block.  Patient referred in.  Patient without chest pain.  Patient's mitral valve replacement was December 26.  Also from reading procedures patient had Maze procedure.  Suspect patient had atrial fibrillation flutter prior to the heart valve.  But patient states she did not.  Also had clipping of atrial appendage of the heart had a cardiac cath on December 24.  Patient's wound is healing well.  Patient feels as if her heart is may be going fast on and off since yesterday afternoon.      Past Medical History:  Diagnosis Date  . Acute exacerbation of CHF (congestive heart failure) (HCC) 02/27/2017  . Arthritis   . Hypertension     Patient Active Problem List   Diagnosis Date Noted  . A-fib (HCC) 03/13/2017  . S/P MVR (mitral valve replacement) 03/04/2017  . Pulmonary hypertension (HCC) 03/03/2017  . Non-rheumatic mitral regurgitation   . Hypothyroidism   . Elevated LFTs   . Acute exacerbation of CHF (congestive heart failure) (HCC) 02/27/2017  . Acute pulmonary edema (HCC)   . Shortness of breath   . Tobacco abuse     Past Surgical History:  Procedure Laterality Date  . CLIPPING OF ATRIAL APPENDAGE N/A 03/04/2017   Procedure: CLIPPING OF ATRIAL APPENDAGE;  Surgeon: Kerin Perna, MD;  Location: [facility] OR;  Service: Open Heart Surgery;  Laterality: N/A;  . MAZE N/A 03/04/2017   Procedure: MAZE;  Surgeon: Kerin Perna, MD;  Location: [facility] OR;  Service: Open Heart Surgery;  Laterality: N/A;  . MITRAL VALVE REPLACEMENT N/A 03/04/2017   Procedure: MITRAL VALVE (MV) REPLACEMENT, MAZE PROCEDURE;  Surgeon: Kerin Perna, MD;  Location: [facility] OR;  Service: Open Heart Surgery;  Laterality: N/A;  . RIGHT/LEFT HEART CATH AND CORONARY ANGIOGRAPHY N/A 03/02/2017   Procedure: RIGHT/LEFT HEART CATH AND CORONARY ANGIOGRAPHY;  Surgeon: Rinaldo Cloud, MD;  Location: [facility] INVASIVE CV LAB;  Service: Cardiovascular;  Laterality: N/A;    OB History    No data available       Home Medications    Prior to Admission medications   Medication Sig Start Date End Date Taking? Authorizing Provider  acetaminophen (TYLENOL) 325 MG tablet Take 650 mg by mouth every 6 (six) hours as needed for headache (pain).    [provider]  Aspirin-Salicylamide-Caffeine (BC HEADACHE POWDER PO) Take 1 packet by mouth 2 (two) times daily as needed (pain/headache).    [provider]  escitalopram (LEXAPRO) 20 MG tablet Take 20 mg by mouth at bedtime. 02/17/17   [provider]  furosemide (LASIX) 40 MG tablet Take 1 tablet (40 mg total) by mouth daily. 03/11/17   Barrett, Erin R, PA-C  levothyroxine (SYNTHROID, LEVOTHROID) 100 MCG tablet Take 100 mcg by mouth daily before breakfast. 02/14/17   [provider]  lisinopril (PRINIVIL,ZESTRIL) 5 MG tablet Take 1 tablet (5  mg total) by mouth daily. 03/11/17   Barrett, Erin R, PA-C  mometasone-formoterol (DULERA) 200-5 MCG/ACT AERO Inhale 2 puffs into the lungs 2 (two) times daily. 03/11/17   Barrett, Erin R, PA-C  nicotine (NICODERM CQ - DOSED IN MG/24 HOURS) 14 mg/24hr patch Place 1 patch (14 mg total) onto the skin daily. 03/11/17   Barrett, Erin R, PA-C  oxyCODONE (OXY IR/ROXICODONE) 5 MG immediate release tablet Take 1 tablet (5 mg total) by mouth every 4 (four) hours as needed for severe pain. 03/11/17   Barrett, Erin R, PA-C  potassium chloride (K-DUR,KLOR-CON) 20  MEQ tablet Take 1 tablet (20 mEq total) by mouth daily. 03/11/17   Barrett, Erin R, PA-C  warfarin (COUMADIN) 7.5 MG tablet Take 1 tablet (7.5 mg total) by mouth daily at 6 PM. 03/11/17   Barrett, Rae Roam, PA-C    Family History No family history on file.  Social History Social History   Tobacco Use  . Smoking status: Current Every Day Smoker  . Smokeless tobacco: Current User  Substance Use Topics  . Alcohol use: Yes  . Drug use: No     Allergies   Patient has no known allergies.   Review of Systems Review of Systems  Constitutional: Positive for fever.  HENT: Negative for congestion.   Eyes: Negative for visual disturbance.  Respiratory: Negative for shortness of breath.   Cardiovascular: Positive for palpitations. Negative for chest pain.  Gastrointestinal: Negative for abdominal pain.  Genitourinary: Negative for dysuria and hematuria.  Musculoskeletal: Negative for back pain.  Skin: Negative for rash.  Neurological: Positive for weakness. Negative for syncope.  Hematological: Bruises/bleeds easily.  Psychiatric/Behavioral: Negative for confusion.     Physical Exam Updated Vital Signs BP 102/84   Pulse (!) 143   Temp (!) 97.5 F (36.4 C) (Oral)   Resp 16   Ht 1.651 m (5\' 5" )   Wt 72.6 kg (160 lb)   SpO2 100%   BMI 26.63 kg/m   Physical Exam  Constitutional: She is oriented to person, place, and time. She appears well-developed and well-nourished. No distress.  HENT:  Head: Normocephalic and atraumatic.  Mouth/Throat: Oropharynx is clear and moist.  Eyes: Conjunctivae and EOM are normal. Pupils are equal, round, and reactive to light.  Neck: Normal range of motion. Neck supple.  Cardiovascular: Normal heart sounds.  Rate regular for the most part but rapid.  Occasionally irregular.    Pulmonary/Chest: Effort normal and breath sounds normal. No respiratory distress.  Sternal incision healing well no signs of infection.  Abdominal: Soft. Bowel sounds are  normal. There is no tenderness.  Musculoskeletal: Normal range of motion. She exhibits no edema.  Neurological: She is alert and oriented to person, place, and time. No cranial nerve deficit or sensory deficit. She exhibits normal muscle tone. Coordination normal.  Skin: Skin is warm.  Nursing note and vitals reviewed.    ED Treatments / Results  Labs (all labs ordered are listed, but only abnormal results are displayed) Labs Reviewed  CBC WITH DIFFERENTIAL/PLATELET - Abnormal; Notable for the following components:      Result Value   Hemoglobin 11.8 (*)    Platelets 126 (*)    All other components within normal limits  BASIC METABOLIC PANEL - Abnormal; Notable for the following components:   Sodium 133 (*)    CO2 19 (*)    Glucose, Bld 102 (*)    Calcium 8.8 (*)    All other components within normal limits  PROTIME-INR - Abnormal; Notable for the following components:   Prothrombin Time 27.8 (*)    All other components within normal limits  I-STAT CHEM 8, ED - Abnormal; Notable for the following components:   Sodium 132 (*)    BUN 25 (*)    Glucose, Bld 107 (*)    Calcium, Ion 1.08 (*)    All other components within normal limits  TSH    EKG  EKG Interpretation  Date/Time:  Wednesday April 01 2017 14:17:51 EST Ventricular Rate:  148 PR Interval:    QRS Duration: 152 QT Interval:  330 QTC Calculation: 518 R Axis:   59 Text Interpretation:  Sinus tachycardia Non-specific intra-ventricular conduction block Abnormal ECG Confirmed by Vanetta Mulders [phone]) on 04/01/2017 4:01:34 PM       Radiology Dg Chest Portable 1 View  Result Date: 04/01/2017 CLINICAL DATA:  Low blood pressure and atrial flutter EXAM: PORTABLE CHEST 1 VIEW COMPARISON:  03/07/2017 FINDINGS: Cardiac shadow is prominent but stable. Postsurgical changes are again seen. Elevation of the left hemidiaphragm is noted with a small left pleural effusion. The overall appearance is slightly improved when  compared with the prior exam. No new focal infiltrate is seen. No bony abnormality is noted. IMPRESSION: Stable changes in the left base.  No new focal abnormality is noted. Electronically Signed   By: Alcide Clever M.D.   On: 04/01/2017 16:07    Procedures Procedures (including critical care time)  CRITICAL CARE Performed by: Vanetta Mulders Total critical care time: 30 minutes Critical care time was exclusive of separately billable procedures and treating other patients. Critical care was necessary to treat or prevent imminent or life-threatening deterioration. Critical care was time spent personally by me on the following activities: development of treatment plan with patient and/or surrogate as well as nursing, discussions with consultants, evaluation of patient's response to treatment, examination of patient, obtaining history from patient or surrogate, ordering and performing treatments and interventions, ordering and review of laboratory studies, ordering and review of radiographic studies, pulse oximetry and re-evaluation of patient's condition.  Medications Ordered in ED Medications  0.9 %  sodium chloride infusion ( Intravenous New Bag/Given 04/01/17 1529)  diltiazem (CARDIZEM) 1 mg/mL load via infusion 10 mg (10 mg Intravenous Bolus from Bag 04/01/17 1601)    And  diltiazem (CARDIZEM) 100 mg in dextrose 5% (1 mg/mL) infusion (5 mg/hr Intravenous New Bag/Given 04/01/17 1605)  amiodarone (NEXTERONE) IV bolus only 150 mg/100 mL (not administered)  sodium chloride 0.9 % bolus 500 mL (0 mLs Intravenous Stopped 04/01/17 1554)     Initial Impression / Assessment and Plan / ED Course  I have reviewed the triage vital signs and the nursing notes.  Pertinent labs & imaging results that were available during my care of the patient were reviewed by me and considered in my medical decision making (see chart for details).    EKG interpreted by the computer is sinus tachycardia.  Fairly  regular.  Sometimes on monitor irregular.  Heart rate very well could be atrial flutter with 2-1 block.  Do not think it is an SVT.  The measurements and components of atrial fib.  Patient is on Coumadin.  Her level was therapeutic.  Discussed with Dr. Sharyn Lull.  Prior to this patient was given a diltiazem bolus of 10 mg.  No real effect.  Patient also was given a little bit of a fluid bolus brought her blood pressures up to 102103 systolic.  They were marginal before.  Patient feeling better overall.  Dr. Sharyn Lull recommended appropriately to try amiodarone bolus.  That has been started.  Heart rate is now down to around 110.  Dr. Sharyn Lull will admit.  Chest x-ray negative for pulmonary edema.  Patient has a history of CHF but no evidence on today's study.   Final Clinical Impressions(s) / ED Diagnoses   Final diagnoses:  Atrial flutter with rapid ventricular response [facility])    ED Discharge Orders    None       Vanetta Mulders, MD 04/01/17 1706

## 2017-04-01 NOTE — Progress Notes (Signed)
ANTICOAGULATION CONSULT NOTE - Initial Consult  Pharmacy Consult for warfarin Indication: atrial fibrillation  No Known Allergies  Patient Measurements: Height: 5\' 5"  (165.1 cm) Weight: 160 lb (72.6 kg) IBW/kg (Calculated) : 57  Vital Signs: Temp: 97.5 F (36.4 C) (01/23 1424) Temp Source: Oral (01/23 1424) BP: 95/83 (01/23 1755) Pulse Rate: 81 (01/23 1730)  Labs: Recent Labs    04/01/17 1503 04/01/17 1510  HGB 11.8* 13.6  HCT 36.1 40.0  PLT 126*  --   LABPROT 27.8*  --   INR 2.62  --   CREATININE 0.99 0.90    Estimated Creatinine Clearance: 67.2 mL/min (by C-G formula based on SCr of 0.9 mg/dL).   Medical History: Past Medical History:  Diagnosis Date  . Acute exacerbation of CHF (congestive heart failure) (HCC) 02/27/2017  . Arthritis   . Hypertension     Medications:   (Not in a hospital admission)  Assessment: 75 YOF with history of paroxysmal A.fib on warfarin at home. Pt presented with fatigue and SOB noted to have A.flutter. Pharmacy consulted to resume home warfarin dose. H/H wnl. Pt 126k. INR on admission is therapeutic at 2.62. Last dose of warfarin was yesterday.  Home warfarin dose: 5 mg daily  Goal of Therapy:  INR 2-3 Monitor platelets by anticoagulation protocol: Yes   Plan:  -Resume warfarin 5 mg daily -Monitor daily PT/INR and s/s of bleeding  Vinnie Level, PharmD., BCPS Clinical Pharmacist Pager [phone]

## 2017-04-01 NOTE — H&P (Signed)
Diane Nielsen is an 62 y.o. female.   Chief Complaint: Generalized weakness fatigue associated with shortness of breath since yesterday noted to have atrial flutter with 2 to one block in my office HPI: Patient is 62 year old female with past medical history significant for severe symptomatic mitral regurgitation status post bioprosthetic mitral valve replacement, history of paroxysmal A. fib flutter with RVR status post maze procedure and clipping of left atrial appendage, hypothyroidism, depression, history of tobacco abuse, came to my office today for 2-D echo and was noted to be in atrial flutter with 2 to one block. Patient denies any chest pain but complained of feeling tired fatigued and short of breath since yesterday. Patient is referred to the ED for further management. Patient received 10 mg of Cardizem IV without significant improvement in ventricle response and was started on IV amiodarone in ED. Patient heart rate in 120s to 130 with a flutter with variable ventricular response now. Patient denies any dizziness lightheadedness or syncopal episode. Denies PND orthopnea or leg swelling.  Past Medical History:  Diagnosis Date  . Acute exacerbation of CHF (congestive heart failure) ([place]) 02/27/2017  . Arthritis   . Hypertension     Past Surgical History:  Procedure Laterality Date  . CLIPPING OF ATRIAL APPENDAGE N/A 03/04/2017   Procedure: CLIPPING OF ATRIAL APPENDAGE;  Surgeon: Ivin Poot, MD;  Location: [place];  Service: Open Heart Surgery;  Laterality: N/A;  . MAZE N/A 03/04/2017   Procedure: MAZE;  Surgeon: Ivin Poot, MD;  Location: [place];  Service: Open Heart Surgery;  Laterality: N/A;  . MITRAL VALVE REPLACEMENT N/A 03/04/2017   Procedure: MITRAL VALVE (MV) REPLACEMENT, MAZE PROCEDURE;  Surgeon: Ivin Poot, MD;  Location: [place];  Service: Open Heart Surgery;  Laterality: N/A;  . RIGHT/LEFT HEART CATH AND CORONARY ANGIOGRAPHY N/A 03/02/2017   Procedure:  RIGHT/LEFT HEART CATH AND CORONARY ANGIOGRAPHY;  Surgeon: Charolette Forward, MD;  Location: [place] [facility];  Service: Cardiovascular;  Laterality: N/A;    No family history on file. Social History:  reports that she has been smoking.  She uses smokeless tobacco. She reports that she drinks alcohol. She reports that she does not use drugs.  Allergies: No Known Allergies   (Not in a hospital admission)  Results for orders placed or performed during the hospital encounter of 04/01/17 (from the past 48 hour(s))  CBC with Differential/Platelet     Status: Abnormal   Collection Time: 04/01/17  3:03 PM  Result Value Ref Range   WBC 9.7 4.0 - 10.5 K/uL   RBC 3.92 3.87 - 5.11 MIL/uL   Hemoglobin 11.8 (L) 12.0 - 15.0 g/dL   HCT 36.1 36.0 - 46.0 %   MCV 92.1 78.0 - 100.0 fL   MCH 30.1 26.0 - 34.0 pg   MCHC 32.7 30.0 - 36.0 g/dL   RDW 14.6 11.5 - 15.5 %   Platelets 126 (L) 150 - 400 K/uL   Neutrophils Relative % 76 %   Neutro Abs 7.3 1.7 - 7.7 K/uL   Lymphocytes Relative 16 %   Lymphs Abs 1.5 0.7 - 4.0 K/uL   Monocytes Relative 7 %   Monocytes Absolute 0.6 0.1 - 1.0 K/uL   Eosinophils Relative 1 %   Eosinophils Absolute 0.1 0.0 - 0.7 K/uL   Basophils Relative 0 %   Basophils Absolute 0.0 0.0 - 0.1 K/uL  Basic metabolic panel     Status: Abnormal   Collection Time: 04/01/17  3:03  PM  Result Value Ref Range   Sodium 133 (L) 135 - 145 mmol/L   Potassium 4.0 3.5 - 5.1 mmol/L   Chloride 102 101 - 111 mmol/L   CO2 19 (L) 22 - 32 mmol/L   Glucose, Bld 102 (H) 65 - 99 mg/dL   BUN 18 6 - 20 mg/dL   Creatinine, Ser 0.99 0.44 - 1.00 mg/dL   Calcium 8.8 (L) 8.9 - 10.3 mg/dL   GFR calc non Af Amer >60 >60 mL/min   GFR calc Af Amer >60 >60 mL/min    Comment: (NOTE) The eGFR has been calculated using the CKD EPI equation. This calculation has not been validated in all clinical situations. eGFR's persistently <60 mL/min signify possible Chronic Kidney Disease.    Anion gap 12 5 - 15   Protime-INR     Status: Abnormal   Collection Time: 04/01/17  3:03 PM  Result Value Ref Range   Prothrombin Time 27.8 (H) 11.4 - 15.2 seconds   INR 2.62   I-stat Chem 8, ED     Status: Abnormal   Collection Time: 04/01/17  3:10 PM  Result Value Ref Range   Sodium 132 (L) 135 - 145 mmol/L   Potassium 4.6 3.5 - 5.1 mmol/L   Chloride 101 101 - 111 mmol/L   BUN 25 (H) 6 - 20 mg/dL   Creatinine, Ser 0.90 0.44 - 1.00 mg/dL   Glucose, Bld 107 (H) 65 - 99 mg/dL   Calcium, Ion 1.08 (L) 1.15 - 1.40 mmol/L   TCO2 22 22 - 32 mmol/L   Hemoglobin 13.6 12.0 - 15.0 g/dL   HCT 40.0 36.0 - 46.0 %  TSH     Status: Abnormal   Collection Time: 04/01/17  4:06 PM  Result Value Ref Range   TSH 5.594 (H) 0.350 - 4.500 uIU/mL    Comment: Performed by a 3rd Generation assay with a functional sensitivity of <=0.01 uIU/mL.   Dg Chest Portable 1 View  Result Date: 04/01/2017 CLINICAL DATA:  Low blood pressure and atrial flutter EXAM: PORTABLE CHEST 1 VIEW COMPARISON:  03/07/2017 FINDINGS: Cardiac shadow is prominent but stable. Postsurgical changes are again seen. Elevation of the left hemidiaphragm is noted with a small left pleural effusion. The overall appearance is slightly improved when compared with the prior exam. No new focal infiltrate is seen. No bony abnormality is noted. IMPRESSION: Stable changes in the left base.  No new focal abnormality is noted. Electronically Signed   By: Inez Catalina M.D.   On: 04/01/2017 16:07    Review of Systems  Constitutional: Positive for malaise/fatigue. Negative for chills and fever.  Eyes: Negative for blurred vision.  Respiratory: Positive for shortness of breath.   Cardiovascular: Negative for chest pain, palpitations, claudication and leg swelling.  Gastrointestinal: Negative for nausea and vomiting.  Genitourinary: Negative for dysuria.  Neurological: Negative for dizziness.    Blood pressure 95/83, pulse 81, temperature (!) 97.5 F (36.4 C), temperature  source Oral, resp. rate 19, height _0  (1.651 m), weight 72.6 kg (160 lb), SpO2 100 %. Physical Exam  Constitutional: She is oriented to person, place, and time.  HENT:  Head: Normocephalic and atraumatic.  Eyes: Conjunctivae are normal. Pupils are equal, round, and reactive to light. Left eye exhibits no discharge.  Neck: Normal range of motion. Neck supple. No JVD present. No thyromegaly present.  Cardiovascular:  Check cardiac irregularly irregular S1 and S2 soft  Respiratory:  Decreased breath sound at bases  GI: Soft. Bowel sounds are normal. She exhibits no distension. There is no tenderness. There is no rebound and no guarding.  Musculoskeletal: She exhibits no edema, tenderness or deformity.  Neurological: She is alert and oriented to person, place, and time.     Assessment/Plan Atrial flutter with rapid ventricular response Status post symptomatic severe mitral regurgitation and paroxysmal A. fib flutter with RVR status post MVR/Maze procedure/clipping of left atrial appendage Nonobstructive CAD Hypothyroidism Depression History of tobacco abuse in the past Plan As per orders  Charolette Forward, MD 04/01/2017, 6:01 PM

## 2017-04-02 ENCOUNTER — Telehealth (HOSPITAL_COMMUNITY): Payer: Self-pay

## 2017-04-02 LAB — LIPID PANEL
Cholesterol: 126 mg/dL (ref 0–200)
HDL: 30 mg/dL — ABNORMAL LOW (ref >40)
LDL CALC: 78 mg/dL (ref 0–99)
Total CHOL/HDL Ratio: 4.2 RATIO
Triglycerides: 88 mg/dL (ref <150)
VLDL: 18 mg/dL (ref 0–40)

## 2017-04-02 LAB — BASIC METABOLIC PANEL
ANION GAP: 11 (ref 5–15)
BUN: 14 mg/dL (ref 6–20)
CALCIUM: 8.5 mg/dL — AB (ref 8.9–10.3)
CHLORIDE: 108 mmol/L (ref 101–111)
CO2: 18 mmol/L — AB (ref 22–32)
Creatinine, Ser: 0.71 mg/dL (ref 0.44–1.00)
GFR calc non Af Amer: >60 mL/min (ref >60)
Glucose, Bld: 91 mg/dL (ref 65–99)
POTASSIUM: 3.6 mmol/L (ref 3.5–5.1)
Sodium: 137 mmol/L (ref 135–145)

## 2017-04-02 LAB — CBC
HEMATOCRIT: 33.2 % — AB (ref 36.0–46.0)
HEMOGLOBIN: 10.7 g/dL — AB (ref 12.0–15.0)
MCH: 29.6 pg (ref 26.0–34.0)
MCHC: 32.2 g/dL (ref 30.0–36.0)
MCV: 91.7 fL (ref 78.0–100.0)
Platelets: 104 10*3/uL — ABNORMAL LOW (ref 150–400)
RBC: 3.62 MIL/uL — AB (ref 3.87–5.11)
RDW: 14.2 % (ref 11.5–15.5)
WBC: 6 10*3/uL (ref 4.0–10.5)

## 2017-04-02 LAB — PROTIME-INR
INR: 2.82
Prothrombin Time: 29.4 seconds — ABNORMAL HIGH (ref 11.4–15.2)

## 2017-04-02 MED ORDER — AMIODARONE HCL 200 MG PO TABS: 200.0000 mg | ORAL_TABLET | Freq: Two times a day (BID) | ORAL | 3 refills | Status: AC

## 2017-04-02 MED ORDER — WARFARIN SODIUM 4 MG PO TABS: 4.0000 mg | ORAL_TABLET | Freq: Every day | ORAL | 3 refills | Status: AC

## 2017-04-02 MED ORDER — AMIODARONE HCL 200 MG PO TABS: 200.0000 mg | ORAL_TABLET | Freq: Two times a day (BID) | ORAL | Status: DC

## 2017-04-02 MED ORDER — ASPIRIN 81 MG PO TBEC: 81.0000 mg | DELAYED_RELEASE_TABLET | Freq: Every day | ORAL | 3 refills | Status: DC

## 2017-04-02 NOTE — Discharge Summary (Signed)
Discharge summary dictated on 04/02/2017, dictation number is [phone]

## 2017-04-02 NOTE — Plan of Care (Signed)
Continues on Amio gtt. Tolerates well. Remains in NSR.

## 2017-04-02 NOTE — Plan of Care (Signed)
Pt oriented to unit, method of reporting concerns, and plan of care. Verbalized understanding of need to call for assistance prior to ambulation. Verbalizes understanding of education.

## 2017-04-02 NOTE — Telephone Encounter (Signed)
Attempted to call patient in regards to Cardiac Rehab - Lm on vm °

## 2017-04-02 NOTE — Progress Notes (Signed)
ANTICOAGULATION CONSULT NOTE - Follow Up Consult  Pharmacy Consult for Coumadin Indication: afib/flutter   No Known Allergies  Patient Measurements: Height: 5\' 6"  (167.6 cm) Weight: 145 lb 14.4 oz (66.2 kg) IBW/kg (Calculated) : 59.3   Vital Signs: Temp: 97.9 F (36.6 C) (01/24 0912) Temp Source: Oral (01/24 0912) BP: 113/69 (01/24 0912) Pulse Rate: 88 (01/24 0912)  Labs: Recent Labs    04/01/17 1503 04/01/17 1510 04/02/17 0428  HGB 11.8* 13.6 10.7*  HCT 36.1 40.0 33.2*  PLT 126*  --  104*  LABPROT 27.8*  --  29.4*  INR 2.62  --  2.82  CREATININE 0.99 0.90 0.71    Estimated Creatinine Clearance: 70.9 mL/min (by C-G formula based on SCr of 0.71 mg/dL).   Assessment:  Anticoag: Warfarin as PTA. Hgb 10.7 Plts down to 104. INR up to 2.82 today. Home warfarin dose: 5 mg daily with admit INR 2.62.  Goal of Therapy:  INR 2-3 Monitor platelets by anticoagulation protocol: Yes   Plan:  -Resumed warfarin 5 mg daily -Monitor daily PT/INR and s/s of bleeding   Claryce Friel S. Merilynn Finland, PharmD, [facility] Clinical Staff Pharmacist Pager [phone]  Misty Stanley Stillinger 04/02/2017,10:00 AM

## 2017-04-02 NOTE — Progress Notes (Signed)
Notified by CCMD that pt converted to sinus rhythm. Confirmed by EKG placed in chart.

## 2017-04-03 ENCOUNTER — Telehealth (HOSPITAL_COMMUNITY): Payer: Self-pay

## 2017-04-03 NOTE — Discharge Summary (Signed)
NAME:  REHEMA, MUFFLEY                   ACCOUNT NO.:  MEDICAL RECORD NO.:  [id number]  LOCATION:                                 FACILITY:  PHYSICIAN:  Galina Haddox N. Sharyn Lull, M.D. DATE OF BIRTH:  February 28, 1956  DATE OF ADMISSION:  04/01/2017 DATE OF DISCHARGE:  04/02/2017                              DISCHARGE SUMMARY   ADMITTING DIAGNOSES: 1. Atrial fibrillation/flutter with rapid ventricular response. 2. Status post symptomatic severe mitral regurgitation and paroxysmal     atrial fibrillation/flutter with rapid ventricular response, status     post recent mitral valve replacement/Maze procedure/clipping of the     left atrial appendage. 3. Nonobstructive coronary artery disease. 4. Hypothyroidism. 5. Depression. 6. History of tobacco abuse in the past.  FINAL DIAGNOSES: 1. Status post atrial flutter with rapid ventricular response,     converted to sinus rhythm. 2. Status post symptomatic severe mitral regurgitation and paroxysmal     atrial fibrillation/flutter with rapid ventricular response, status     post recent mitral valve replacement/Maze procedure/clipping of     left atrial appendage. 3. Nonobstructive coronary artery disease. 4. Hypothyroidism. 5. Depression. 6. History of tobacco abuse in the past.  DISCHARGE HOME MEDICATIONS: 1. Amiodarone 200 mg one tablet twice daily. 2. Aspirin 81 mg one tablet daily. 3. Tylenol 650 mg every 6 hours as needed. 4. Lexapro 20 mg at bedtime. 5. Levothyroxine 100 mcg daily. 6. Dulera inhaler two puffs twice daily as before. 7. NicoDerm CQ patch 14 mg daily. 8. Oxycodone 5 mg every 4 hours as needed for pain.  Warfarin dose has     been reduced to 4 mg daily.  The patient has been advised to stop     the Lasix, lisinopril and potassium due to hypotension.  DIET:  Low-salt, low-cholesterol diet.  ACTIVITY:  Increase activity slowly as tolerated.  FOLLOWUP:  Follow up with me in 1 week.  Follow up with Cardiovascular Surgery  as scheduled.  CONDITION AT DISCHARGE:  Stable.  BRIEF HISTORY AND HOSPITAL COURSE:  Ms. Paster is 62 year old female with past medical history significant for severe symptomatic mitral regurgitation, status post bioprosthetic mitral valve replacement; history of paroxysmal AFib/flutter with RVR, status post Maze procedure and clipping of left atrial appendage; hypothyroidism; depression; history of tobacco abuse.  She came to my office today for 2D echo and was noted to be in atrial flutter with 2-1 block.  The patient denies any chest pain, but complains of feeling tired, fatigued, shortness of breath since yesterday.  The patient denies any palpitation.  The patient was referred to ED for further management.  The patient received 10 mg of Cardizem IV without significant improvement in her ventricular response and was started on IV amiodarone in ED.  The patient's heart rate in the ED was in 120s to 130s with atrial flutter with variable block, variable ventricular response.  The patient denies any dizziness, lightheadedness, or syncopal episode.  Denies PND, orthopnea, leg swelling.  The patient had episode of hypotension, received fluid bolus with improvement in her blood pressure.  PHYSICAL EXAMINATION:  GENERAL:  She was alert, awake, oriented x3. Hemodynamically stable. VITAL SIGNS:  Blood pressure was soft, 95/83, pulse between 80-120, AFib/flutter with variable response.  She was afebrile. HEENT:  Conjunctivae were pink. NECK:  Supple.  No JVD.  No bruit. LUNGS:  Decreased breath sounds at bases. CARDIOVASCULAR:  Irregularly irregular.  S1, S2 were soft. ABDOMEN:  Soft.  Bowel sounds present.  Nontender. EXTREMITIES:  There was no clubbing, cyanosis, or edema.  LABORATORY DATA:  Sodium was 133, potassium 4.0, BUN 18, creatinine 0.99.  Her INR was 2.62.  Hemoglobin was 11.8, hematocrit 36.1, white count of 9.7.  Today's PT is 29.4, INR is 2.82.  EKG this morning showed  normal sinus rhythm, possible left atrial enlargement, no acute ischemic changes noted.  BRIEF HOSPITAL COURSE:  The patient was admitted to telemetry unit and was started on IV amiodarone.  The patient spontaneously last night converted to sinus rhythm.  The patient has remained in sinus rhythm all day long today.  The patient denies any palpitations, denies dizziness. Has been ambulating in room without any problems.  The patient will be discharged home on above medications.  We will adjust the dose of amiodarone as outpatient after 1 week.  The patient has been advised to get PT/INR in 1 week.  The patient will follow up with Thoracic Surgery as scheduled and with my office in 1 week.     Eduardo Osier. Sharyn Lull, M.D.     MNH/MEDQ  D:  04/02/2017  T:  04/03/2017  Job:  [id number]  cc:   Eduardo Osier. Sharyn Lull, M.D.

## 2017-04-03 NOTE — Telephone Encounter (Signed)
Patient returned phone call in regards to Cardiac Rehab - Patient stated she was admitted into the hospital Wednesday night due to low BP and discharged Thursday night. Patient has follow up appt with the Dr.Harwani on 04/08/2017. Will continue to follow.

## 2017-04-07 ENCOUNTER — Other Ambulatory Visit: Payer: Self-pay | Admitting: Cardiothoracic Surgery

## 2017-04-07 DIAGNOSIS — Z952 Presence of prosthetic heart valve: Secondary | ICD-10-CM

## 2017-04-08 ENCOUNTER — Ambulatory Visit
Admission: RE | Admit: 2017-04-08 | Discharge: 2017-04-08 | Disposition: A | Discharge status: Home / Self Care | Payer: PRIVATE HEALTH INSURANCE | Source: Ambulatory Visit | Attending: Cardiothoracic Surgery | Admitting: Cardiothoracic Surgery

## 2017-04-08 ENCOUNTER — Ambulatory Visit (INDEPENDENT_AMBULATORY_CARE_PROVIDER_SITE_OTHER): Payer: Self-pay | Admitting: Cardiothoracic Surgery

## 2017-04-08 VITALS — BP 111/79 | HR 96 | Resp 20 | Ht 65.0 in | Wt 145.0 lb

## 2017-04-08 DIAGNOSIS — Z952 Presence of prosthetic heart valve: Secondary | ICD-10-CM

## 2017-04-09 ENCOUNTER — Encounter: Payer: Self-pay | Admitting: Cardiothoracic Surgery

## 2017-04-09 NOTE — Progress Notes (Signed)
PCP is Diane Nielsen, No Pcp Per Referring Provider is Rinaldo Cloud, MD  Chief Complaint  Diane Nielsen presents with  . Routine Post Op    f/u after surgery with CXR s/p MVR, MAZE    HPI: Diane Nielsen returns for one month follow-up after undergoing mitral valve replacement for severe rheumatic mitral regurgitation and biatrial maze procedure with left atrial clip. The Diane Nielsen was in severe heart failure and actively smoking when admitted for surgery.  She now looks fantastic. She did require a short readmission for an episode of atrial arrhythmia but converted to sinus rhythm on amiodarone. She continues warfarin and her other postop meds. She denies orthopnea or edema and she is getting stronger every day. She is anxious to increase her activity levels and to start driving etc. Surgical incisions are healing well.  Chest x-ray today is clear with out pleural effusion and a normal cardiac silhouette with sternal wires intact.    Past Medical History:  Diagnosis Date  . Acute exacerbation of CHF (congestive heart failure) ([facility]) 02/27/2017  . Arthritis   . Hypertension     Past Surgical History:  Procedure Laterality Date  . CLIPPING OF ATRIAL APPENDAGE N/A 03/04/2017   Procedure: CLIPPING OF ATRIAL APPENDAGE;  Surgeon: Kerin Perna, MD;  Location: [facility] OR;  Service: Open Heart Surgery;  Laterality: N/A;  . MAZE N/A 03/04/2017   Procedure: MAZE;  Surgeon: Kerin Perna, MD;  Location: [facility] OR;  Service: Open Heart Surgery;  Laterality: N/A;  . MITRAL VALVE REPLACEMENT N/A 03/04/2017   Procedure: MITRAL VALVE (MV) REPLACEMENT, MAZE PROCEDURE;  Surgeon: Kerin Perna, MD;  Location: [facility] OR;  Service: Open Heart Surgery;  Laterality: N/A;  . RIGHT/LEFT HEART CATH AND CORONARY ANGIOGRAPHY N/A 03/02/2017   Procedure: RIGHT/LEFT HEART CATH AND CORONARY ANGIOGRAPHY;  Surgeon: Rinaldo Cloud, MD;  Location: [facility];  Service: Cardiovascular;  Laterality: N/A;    History reviewed. No  pertinent family history.  Social History Social History   Tobacco Use  . Smoking status: Current Every Day Smoker  . Smokeless tobacco: Current User  Substance Use Topics  . Alcohol use: Yes  . Drug use: No    Current Outpatient Medications  Medication Sig Dispense Refill  . acetaminophen (TYLENOL) 325 MG tablet Take 650 mg by mouth every 6 (six) hours as needed for headache (pain).    Marland Kitchen amiodarone (PACERONE) 200 MG tablet Take 1 tablet (200 mg total) by mouth 2 (two) times daily. 60 tablet 3  . aspirin EC 81 MG EC tablet Take 1 tablet (81 mg total) by mouth daily. 30 tablet 3  . escitalopram (LEXAPRO) 20 MG tablet Take 20 mg by mouth at bedtime.  0  . levothyroxine (SYNTHROID, LEVOTHROID) 100 MCG tablet Take 100 mcg by mouth daily before breakfast.  1  . mometasone-formoterol (DULERA) 200-5 MCG/ACT AERO Inhale 2 puffs into the lungs 2 (two) times daily. 1 Inhaler 12  . nicotine (NICODERM CQ - DOSED IN MG/24 HOURS) 14 mg/24hr patch Place 1 patch (14 mg total) onto the skin daily. 28 patch 0  . warfarin (COUMADIN) 4 MG tablet Take 1 tablet (4 mg total) by mouth daily at 6 PM. 35 tablet 3  . oxyCODONE (OXY IR/ROXICODONE) 5 MG immediate release tablet Take 1 tablet (5 mg total) by mouth every 4 (four) hours as needed for severe pain. (Diane Nielsen not taking: Reported on 04/08/2017) 30 tablet 0   No current facility-administered medications for this visit.  No Known Allergies  Review of Systems  Improved appetite and strength\ Minimal smoking Not requiring narcotics Incisions clean and dry  BP 111/79   Pulse 96   Resp 20   Ht 5\' 5"  (1.651 m)   Wt 145 lb (65.8 kg)   SpO2 98% Comment: RA  BMI 24.13 kg/m  Physical Exam      Exam    General- alert and comfortable    Neck- no JVD, no cervical adenopathy palpable, no carotid bruit   Lungs- clear without rales, wheezes   Cor- regular rate and rhythm, no murmur , gallop   Abdomen- soft, non-tender   Extremities - warm,  non-tender, minimal edema   Neuro- oriented, appropriate, no focal weakness   Diagnostic Tests: Chest x-ray clear  Impression: Excellent early recovery after mitral valve for 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 Currently maintaining sinus rhythm.  Plan: The Diane Nielsen may drive and do normal activities but now lift more than 20 pounds until 90 days after surgery. I will see her back in approximate 4- 6 weeks to review her progress and to discuss returning to work. 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   Mikey Bussing, MD [facility] [phone]

## 2017-04-14 ENCOUNTER — Telehealth (HOSPITAL_COMMUNITY): Payer: Self-pay

## 2017-04-14 NOTE — Telephone Encounter (Signed)
Patient called to follow up on her referral as her appt with Cardiologist is tomorrow- explained to patient that after her appt we would be calling to schedule. I also informed patient that we are scheduling for the end of march and patient stated she will be back at work by then. I explained to patient that she needs to speak with her Cardiologist in regards to what exercises she can do and what he recommends. Closed referral.

## 2017-05-01 ENCOUNTER — Encounter: Payer: Self-pay | Admitting: *Deleted

## 2017-05-06 ENCOUNTER — Encounter: Payer: PRIVATE HEALTH INSURANCE | Admitting: Cardiothoracic Surgery

## 2017-05-20 ENCOUNTER — Encounter: Payer: PRIVATE HEALTH INSURANCE | Admitting: Cardiothoracic Surgery

## 2017-05-27 ENCOUNTER — Encounter: Payer: Self-pay | Admitting: Cardiothoracic Surgery

## 2017-05-27 ENCOUNTER — Ambulatory Visit (INDEPENDENT_AMBULATORY_CARE_PROVIDER_SITE_OTHER): Payer: Self-pay | Admitting: Cardiothoracic Surgery

## 2017-05-27 VITALS — BP 94/60 | HR 80 | Resp 20 | Ht 65.0 in | Wt 144.0 lb

## 2017-05-27 DIAGNOSIS — Z952 Presence of prosthetic heart valve: Secondary | ICD-10-CM

## 2017-05-27 DIAGNOSIS — Z8679 Personal history of other diseases of the circulatory system: Secondary | ICD-10-CM

## 2017-05-27 NOTE — Progress Notes (Signed)
PCP is Patient, No Pcp Per Referring Provider is Rinaldo Cloud, MD  Chief Complaint  Patient presents with  . Routine Post Op    6 week f/u, needs a return to work note for next week     HPI: Final postop visit almost 3 months after mitral valve replacement and biatrial maze procedure for severe MR and paroxysmal atrial fibrillation.  The patient is followed by Dr. Sharyn Lull.  She is living independently and doing well.  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  She denies any symptoms of heart failure.  She is currently on Coumadin for her history of PAF.  She is maintaining sinus rhythm.  Last chest x-ray is clear.  Past Medical History:  Diagnosis Date  . Acute exacerbation of CHF (congestive heart failure) ([facility]) 02/27/2017  . Arthritis   . Hypertension     Past Surgical History:  Procedure Laterality Date  . CLIPPING OF ATRIAL APPENDAGE N/A 03/04/2017   Procedure: CLIPPING OF ATRIAL APPENDAGE;  Surgeon: Kerin Perna, MD;  Location: [facility] OR;  Service: Open Heart Surgery;  Laterality: N/A;  . MAZE N/A 03/04/2017   Procedure: MAZE;  Surgeon: Kerin Perna, MD;  Location: [facility] OR;  Service: Open Heart Surgery;  Laterality: N/A;  . MITRAL VALVE REPLACEMENT N/A 03/04/2017   Procedure: MITRAL VALVE (MV) REPLACEMENT, MAZE PROCEDURE;  Surgeon: Kerin Perna, MD;  Location: [facility] OR;  Service: Open Heart Surgery;  Laterality: N/A;  . RIGHT/LEFT HEART CATH AND CORONARY ANGIOGRAPHY N/A 03/02/2017   Procedure: RIGHT/LEFT HEART CATH AND CORONARY ANGIOGRAPHY;  Surgeon: Rinaldo Cloud, MD;  Location: [facility];  Service: Cardiovascular;  Laterality: N/A;    History reviewed. No pertinent family history.  Social History Social History   Tobacco Use  . Smoking status: Current Every Day Smoker  . Smokeless tobacco: Current User  Substance Use Topics  . Alcohol use: Yes  . Drug use: No    Current Outpatient Medications  Medication Sig  Dispense Refill  . acetaminophen (TYLENOL) 325 MG tablet Take 650 mg by mouth every 6 (six) hours as needed for headache (pain).    Marland Kitchen amiodarone (PACERONE) 200 MG tablet Take 1 tablet (200 mg total) by mouth 2 (two) times daily. 60 tablet 3  . aspirin EC 81 MG EC tablet Take 1 tablet (81 mg total) by mouth daily. 30 tablet 3  . escitalopram (LEXAPRO) 20 MG tablet Take 20 mg by mouth at bedtime.  0  . levothyroxine (SYNTHROID, LEVOTHROID) 100 MCG tablet Take 100 mcg by mouth daily before breakfast.  1  . mometasone-formoterol (DULERA) 200-5 MCG/ACT AERO Inhale 2 puffs into the lungs 2 (two) times daily. 1 Inhaler 12  . nicotine (NICODERM CQ - DOSED IN MG/24 HOURS) 14 mg/24hr patch Place 1 patch (14 mg total) onto the skin daily. 28 patch 0  . oxyCODONE (OXY IR/ROXICODONE) 5 MG immediate release tablet Take 1 tablet (5 mg total) by mouth every 4 (four) hours as needed for severe pain. 30 tablet 0  . warfarin (COUMADIN) 4 MG tablet Take 1 tablet (4 mg total) by mouth daily at 6 PM. 35 tablet 3   No current facility-administered medications for this visit.     No Known Allergies  Review of Systems  Weight stable  no fatigue No palpitations No ankle edema Surgical incisions well-healed  BP 94/60   Pulse 80   Resp 20   Ht 5\' 5"  (  1.651 m)   Wt 144 lb (65.3 kg)   SpO2 97% Comment: RA  BMI 23.96 kg/m  Physical Exam      Exam    General- alert and comfortable    Neck- no JVD, no cervical adenopathy palpable, no carotid bruit   Lungs- clear without rales, wheezes   Cor- regular rate and rhythm, no murmur , gallop   Abdomen- soft, non-tender   Extremities - warm, non-tender, minimal edema   Neuro- oriented, appropriate, no focal weakness   Diagnostic Tests: None  Impression: Excellent recovery almost 3 months postop mitral valve replacement for severe MR and Maze procedure.  Plan: She is released to return to work normal activities           Medications per Dr. Sharyn Lull              Return as needed   Mikey Bussing, MD [facility] [phone]

## 2017-06-22 ENCOUNTER — Ambulatory Visit: Payer: PRIVATE HEALTH INSURANCE

## 2017-06-22 ENCOUNTER — Other Ambulatory Visit: Payer: PRIVATE HEALTH INSURANCE

## 2017-06-25 ENCOUNTER — Other Ambulatory Visit: Payer: Self-pay | Admitting: Family Medicine

## 2017-06-25 DIAGNOSIS — Z139 Encounter for screening, unspecified: Secondary | ICD-10-CM

## 2017-08-14 ENCOUNTER — Ambulatory Visit: Payer: PRIVATE HEALTH INSURANCE

## 2017-08-14 ENCOUNTER — Other Ambulatory Visit: Payer: PRIVATE HEALTH INSURANCE

## 2017-08-31 ENCOUNTER — Other Ambulatory Visit: Payer: PRIVATE HEALTH INSURANCE

## 2017-08-31 ENCOUNTER — Ambulatory Visit: Payer: PRIVATE HEALTH INSURANCE

## 2019-03-29 ENCOUNTER — Ambulatory Visit: Payer: PRIVATE HEALTH INSURANCE | Admitting: Family Medicine

## 2019-03-29 VITALS — BP 110/74 | HR 72 | Ht 66.0 in | Wt 145.0 lb

## 2019-03-29 DIAGNOSIS — Z789 Other specified health status: Secondary | ICD-10-CM

## 2019-03-29 NOTE — Progress Notes (Signed)
Subjective:     Patient ID: Diane Nielsen, female   DOB: 06/28/1955, 64 y.o.   MRN: [id number]  HPI [place] presents [facility] the [facility] today for her required wellness visit for insurance.  We reviewed her past medical history and discussed her health maintenance and wellness goals.  She has a primary care provider that she sees on a regular basis.  Her physical and lab work was done earlier this year.  She states she is working on scheduling a follow-up visit with a counselor via a [employer], [employer].  She is due for mammogram, we scheduled her for the mobile mammogram unit that is coming to the site on March 25.  Colonoscopy was done 3 to 4 years ago patient states they told her to follow-up in 10 years.  She states her Pap smear was done last year and is normal.  She is a former smoker quit about 3 years ago.  She is doing well with no complaints.  Past Medical History:  Diagnosis Date  . Acute exacerbation of CHF (congestive heart failure) ([facility]) 02/27/2017  . Arthritis   . Hypertension    No Known Allergies Past Surgical History:  Procedure Laterality Date  . CLIPPING OF ATRIAL APPENDAGE N/A 03/04/2017   Procedure: CLIPPING OF ATRIAL APPENDAGE;  Surgeon: Ivin Poot, MD;  Location: [facility];  Service: Open Heart Surgery;  Laterality: N/A;  . MAZE N/A 03/04/2017   Procedure: MAZE;  Surgeon: Ivin Poot, MD;  Location: [facility];  Service: Open Heart Surgery;  Laterality: N/A;  . MITRAL VALVE REPLACEMENT N/A 03/04/2017   Procedure: MITRAL VALVE (MV) REPLACEMENT, MAZE PROCEDURE;  Surgeon: Ivin Poot, MD;  Location: [facility];  Service: Open Heart Surgery;  Laterality: N/A;  . RIGHT/LEFT HEART CATH AND CORONARY ANGIOGRAPHY N/A 03/02/2017   Procedure: RIGHT/LEFT HEART CATH AND CORONARY ANGIOGRAPHY;  Surgeon: Charolette Forward, MD;  Location: [facility];  Service: Cardiovascular;  Laterality: N/A;   Current Outpatient Medications:  .  amiodarone (PACERONE) 200 MG tablet,  Take 1 tablet (200 mg total) by mouth 2 (two) times daily., Disp: 60 tablet, Rfl: 3 .  buPROPion (WELLBUTRIN XL) 150 MG 24 hr tablet, Take by mouth., Disp: , Rfl:  .  escitalopram (LEXAPRO) 20 MG tablet, Take 20 mg by mouth at bedtime., Disp: , Rfl: 0 .  levothyroxine (SYNTHROID, LEVOTHROID) 100 MCG tablet, Take 100 mcg by mouth daily before breakfast., Disp: , Rfl: 1 .  metoprolol succinate (TOPROL-XL) 25 MG 24 hr tablet, Take by mouth., Disp: , Rfl:  .  warfarin (COUMADIN) 4 MG tablet, Take 1 tablet (4 mg total) by mouth daily at 6 PM., Disp: 35 tablet, Rfl: 3 .  acetaminophen (TYLENOL) 325 MG tablet, Take 650 mg by mouth every 6 (six) hours as needed for headache (pain)., Disp: , Rfl:  .  aspirin EC 81 MG EC tablet, Take 1 tablet (81 mg total) by mouth daily. (Patient not taking: Reported on 03/29/2019), Disp: 30 tablet, Rfl: 3 .  mometasone-formoterol (DULERA) 200-5 MCG/ACT AERO, Inhale 2 puffs into the lungs 2 (two) times daily. (Patient not taking: Reported on 03/29/2019), Disp: 1 Inhaler, Rfl: 12 .  nicotine (NICODERM CQ - DOSED IN MG/24 HOURS) 14 mg/24hr patch, Place 1 patch (14 mg total) onto the skin daily., Disp: 28 patch, Rfl: 0 .  oxyCODONE (OXY IR/ROXICODONE) 5 MG immediate release tablet, Take 1 tablet (5 mg total) by mouth every 4 (four) hours as needed for severe  pain., Disp: 30 tablet, Rfl: 0  Review of Systems  Constitutional: Negative for chills, fatigue, fever and unexpected weight change.  HENT: Negative for congestion, ear pain, sinus pressure, sinus pain and sore throat.   Eyes: Negative for discharge and visual disturbance.  Respiratory: Negative for cough, shortness of breath and wheezing.   Cardiovascular: Negative for chest pain and leg swelling.  Gastrointestinal: Negative for abdominal pain, blood in stool, constipation, diarrhea, nausea and vomiting.  Genitourinary: Negative for difficulty urinating and hematuria.  Skin: Negative for color change.  Neurological:  Negative for dizziness, weakness, light-headedness and headaches.  Hematological: Negative for adenopathy.  All other systems reviewed and are negative.      Objective:   Physical Exam Vitals and nursing note reviewed.  Constitutional:      General: She is not in acute distress.    Appearance: Normal appearance. She is well-developed and normal weight. She is not toxic-appearing.  HENT:     Head: Normocephalic and atraumatic.  Cardiovascular:     Rate and Rhythm: Normal rate and regular rhythm.     Heart sounds: Normal heart sounds. No murmur.  Pulmonary:     Effort: Pulmonary effort is normal. No respiratory distress.     Breath sounds: Normal breath sounds.  Musculoskeletal:     Cervical back: Neck supple.  Skin:    General: Skin is warm and dry.  Neurological:     Mental Status: She is alert and oriented to person, place, and time.  Psychiatric:        Mood and Affect: Mood normal.        Behavior: Behavior normal.        Assessment:     Participant in health and wellness plan      Plan:     1. Health maintenance discussed. Mammogram scheduled. Keep all f/u appt with PCP. Encouraged healthy diet and exercise. F/u here prn.

## 2019-03-29 NOTE — Progress Notes (Deleted)
PCP - [facility], 1.5 weeks ago last seen physical  Counselor- virtual visit- needs to f/u  Mammogram- 2 years  Colonoscopy- 3-4 years ago, normal removed polyps- repeat in 10 years Pap smear last year- normal  Flu shot, covid vaccine done

## 2019-11-03 IMAGING — CT CT ANGIO CHEST
2 of 6 series · 18 of 36 positions shown · IV contrast (iopamidol)
Comparison: Chest radiographs obtained earlier today.

CLINICAL DATA: Strain shortness of breath since last night. Found
unresponsive and cyanotic. Smoker.

EXAM:
CT ANGIOGRAPHY CHEST WITH CONTRAST
TECHNIQUE: Multidetector CT imaging of the chest was performed using the
standard protocol during bolus administration of intravenous
contrast. Multiplanar CT image reconstructions and MIPs were
obtained to evaluate the vascular anatomy.
CONTRAST:  100mL [id number] IOPAMIDOL ([id number]) INJECTION 76%

[Series 7: pe thins · axial · 0.77mm/px · z∈[+1064,+1359]mm · 17 of 333 slices shown]
[im 19/333  lung]
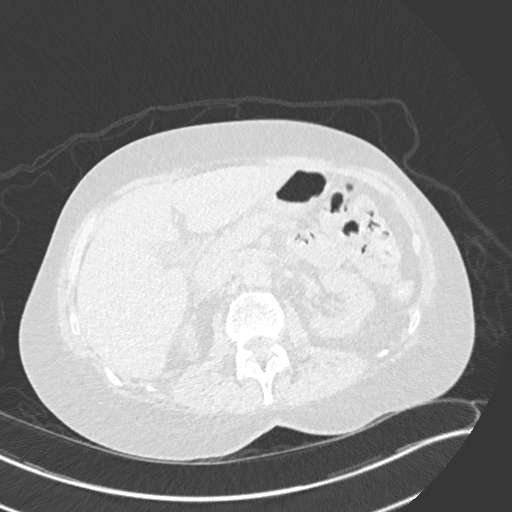
[im 37/333  mediastinal]
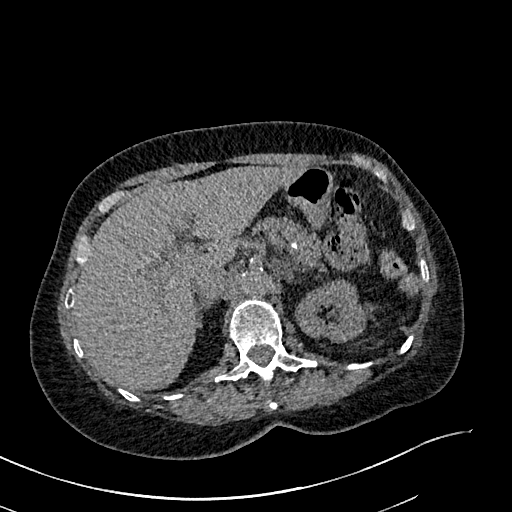
[im 56/333  lung]
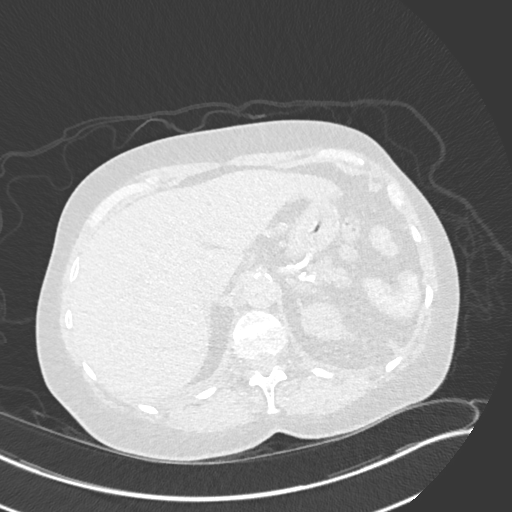
[im 74/333  mediastinal]
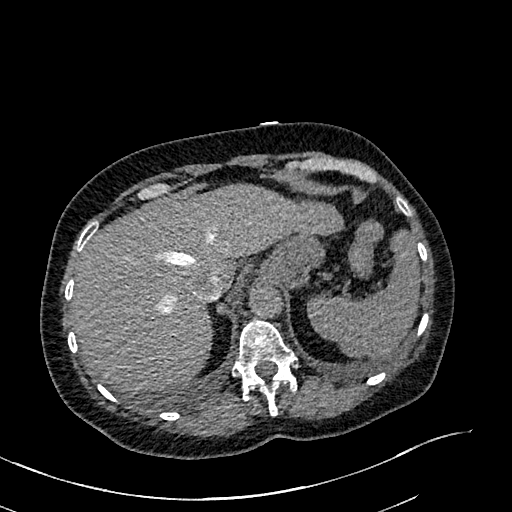
[im 93/333  lung]
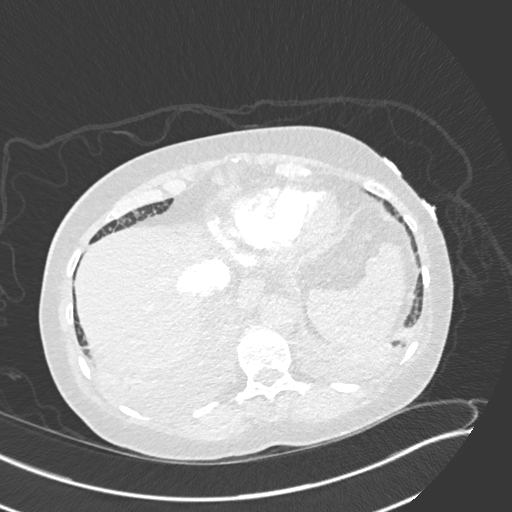
[im 111/333  mediastinal]
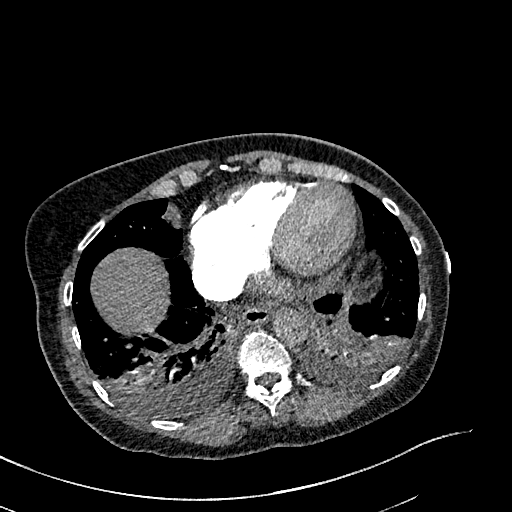
[im 130/333  lung]
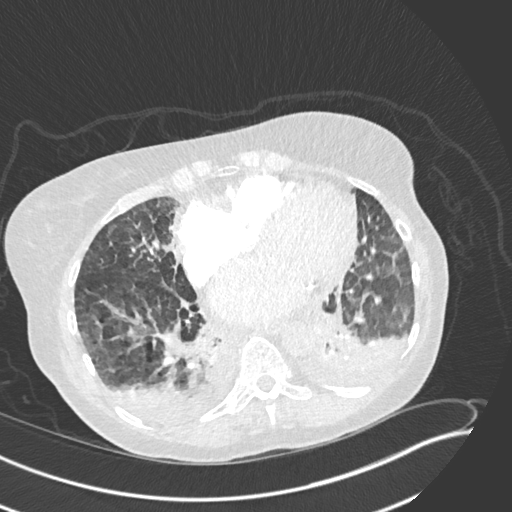
[im 148/333  mediastinal]
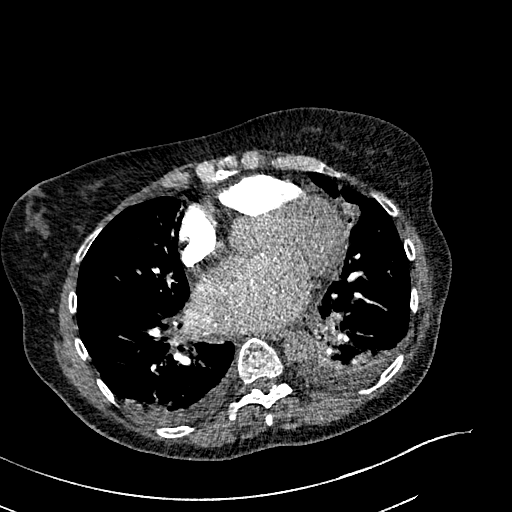
[im 167/333  lung]
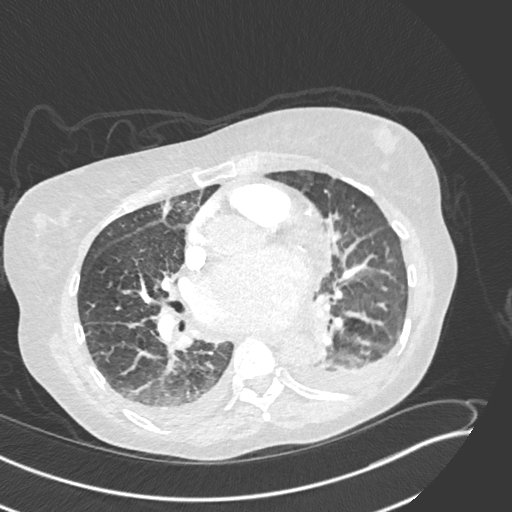
[im 185/333  mediastinal]
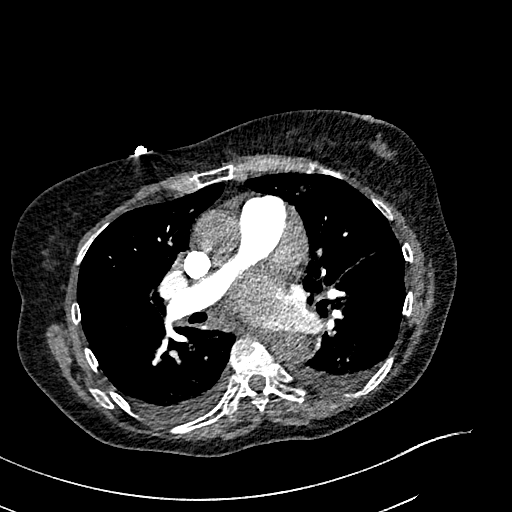
[im 203/333  lung]
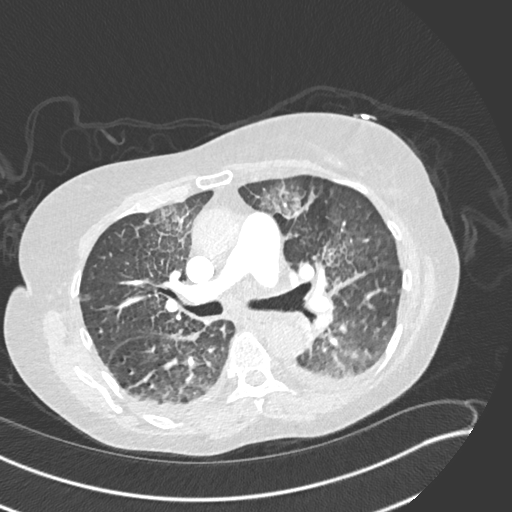
[im 222/333  mediastinal]
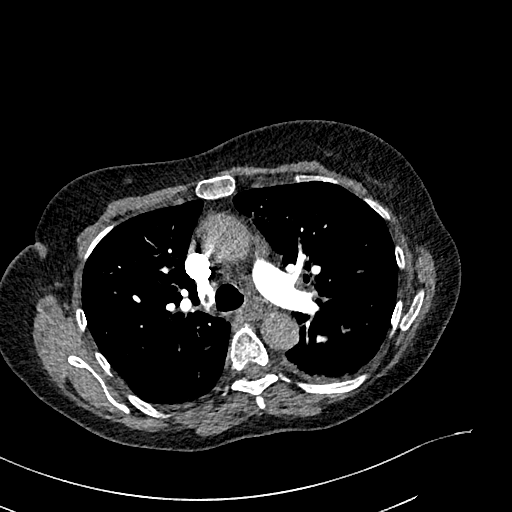
[im 240/333  lung]
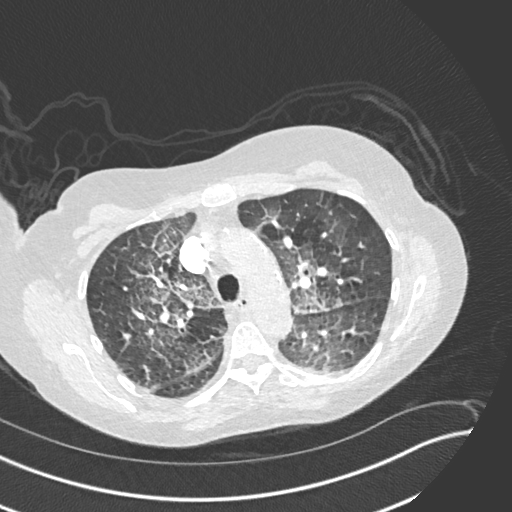
[im 259/333  mediastinal]
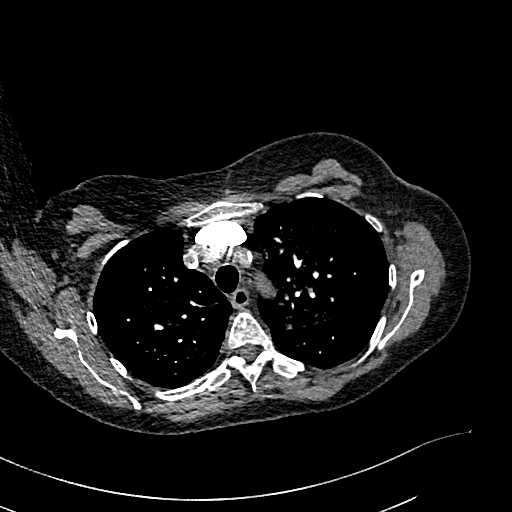
[im 277/333  lung]
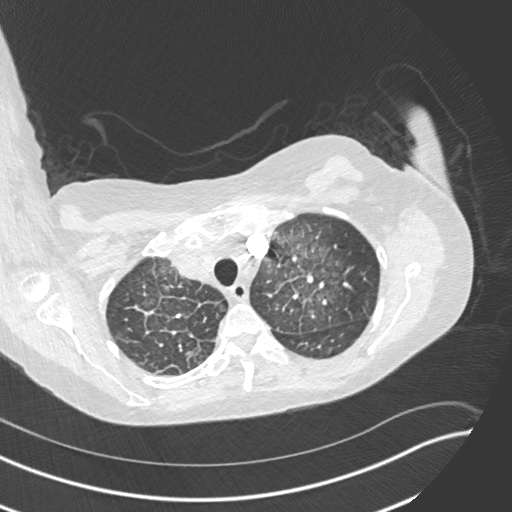
[im 296/333  mediastinal]
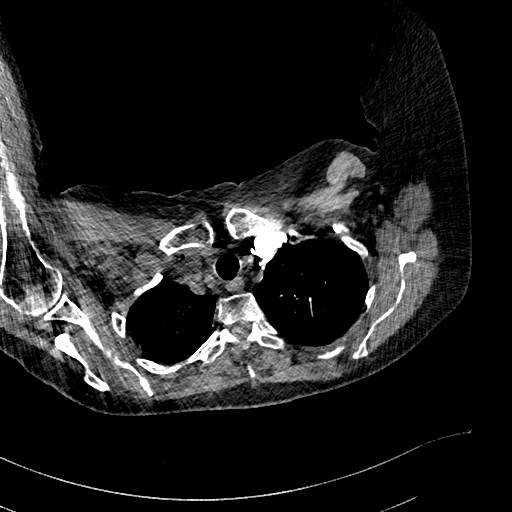
[im 314/333  lung]
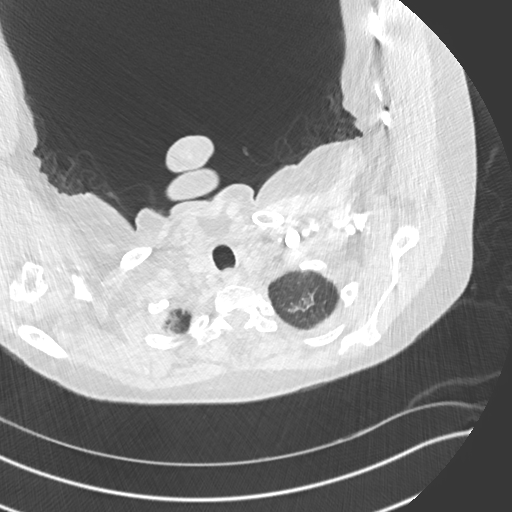

[Series 8: pe 2mm cor · coronal · 0.65mm/px · 1 of 135 slices shown]
[im 68/135  mediastinal]
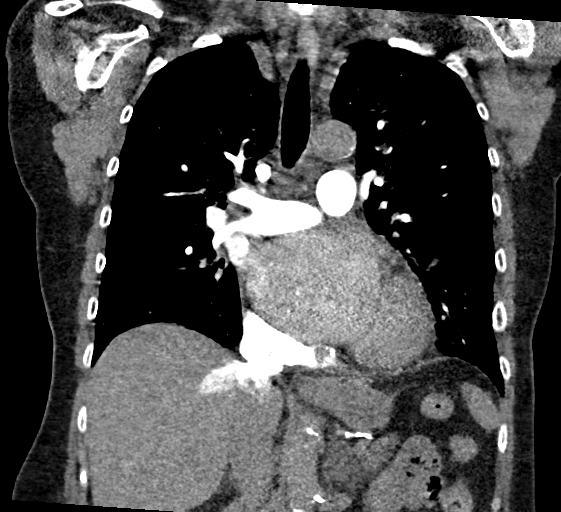

[18 of 36 positions shown; findings below may reference images not displayed]

FINDINGS: Cardiovascular: Normally opacified pulmonary arteries with no
pulmonary arterial filling defects. Mildly enlarged heart.
Atheromatous calcifications, including the aorta and coronary
arteries.

Mediastinum/Nodes: No enlarged mediastinal, hilar, or axillary lymph
nodes. Thyroid gland, trachea, and esophagus demonstrate no
significant findings.

Lungs/Pleura: Small bilateral pleural effusions. Patchy interstitial
prominence in both lungs. Diffusely prominent pulmonary vasculature.
Bilateral dependent lower lobe atelectasis.

Upper Abdomen: Atheromatous arterial calcifications.

Musculoskeletal: Thoracic spine degenerative changes.

Review of the MIP images confirms the above findings.
IMPRESSION: 1. Changes of acute congestive heart failure with mild cardiomegaly
and bilateral pleural effusions.
2. No pulmonary emboli.
3. Bilateral lower lobe atelectasis.
4. Atheromatous calcifications, including the coronary arteries and
aorta.

Aortic Atherosclerosis (ND144-ZLI.I).

## 2020-11-22 ENCOUNTER — Other Ambulatory Visit: Payer: Self-pay | Admitting: Adult Medicine

## 2020-11-22 DIAGNOSIS — Z1231 Encounter for screening mammogram for malignant neoplasm of breast: Secondary | ICD-10-CM

## 2021-06-10 ENCOUNTER — Encounter: Payer: Self-pay | Admitting: Internal Medicine

## 2021-06-10 ENCOUNTER — Ambulatory Visit (INDEPENDENT_AMBULATORY_CARE_PROVIDER_SITE_OTHER): Payer: PRIVATE HEALTH INSURANCE | Admitting: Internal Medicine

## 2021-06-10 VITALS — BP 120/86 | HR 79 | Temp 98.1°F | Ht 66.0 in | Wt 147.9 lb

## 2021-06-10 DIAGNOSIS — E039 Hypothyroidism, unspecified: Secondary | ICD-10-CM | POA: Diagnosis not present

## 2021-06-10 DIAGNOSIS — I480 Paroxysmal atrial fibrillation: Secondary | ICD-10-CM | POA: Diagnosis not present

## 2021-06-10 DIAGNOSIS — Z720 Tobacco use: Secondary | ICD-10-CM

## 2021-06-10 DIAGNOSIS — Z952 Presence of prosthetic heart valve: Secondary | ICD-10-CM

## 2021-06-10 DIAGNOSIS — F339 Major depressive disorder, recurrent, unspecified: Secondary | ICD-10-CM

## 2021-06-10 MED ORDER — LEVOTHYROXINE SODIUM 100 MCG PO TABS: 100.0000 ug | ORAL_TABLET | Freq: Every day | ORAL | 1 refills | Status: DC

## 2021-06-10 NOTE — Patient Instructions (Signed)
-  Nice seeing you today!! ? ?-Return in 3 month for your physical. Please come in fasting. ?

## 2021-06-10 NOTE — Progress Notes (Signed)
? ? ?New Patient Office Visit ? ? ? ? ?This visit occurred during the SARS-CoV-2 public health emergency.  Safety protocols were in place, including screening questions prior to the visit, additional usage of staff PPE, and extensive cleaning of exam room while observing appropriate contact time as indicated for disinfecting solutions.  ? ? ?CC/Reason for Visit: Establish care, medication refills ?Previous PCP: Dr. Carney Bern with [facility] ?Last Visit: Summer 2022 ? ?HPI: Diane Nielsen is a 66 y.o. female who is coming in today for the above mentioned reasons. Past Medical History is significant for: Atrial fibrillation, mitral valve replacement, pulmonary hypertension, hypothyroidism, ongoing tobacco and alcohol use disorder.  She is here today to establish care due to insurance changes.  She is requesting a refill of her levothyroxine.  Her Coumadin levels are followed by her cardiologist, Dr. Terrence Dupont.  She is due for CPE.  She smokes about 10 cigarettes a day, has been a smoker for over 30 years.  She has a history of alcohol use disorder and is currently attending AA.  On average she will drink about a sixpack every night.  No known drug allergies, past surgical history is significant for right clavicle repair and mitral valve replacement.  Family history significant for a mother and a sister both with lung cancer. ? ? ?Past Medical/Surgical History: ?Past Medical History:  ?Diagnosis Date  ? Acute exacerbation of CHF (congestive heart failure) ([place]) 02/27/2017  ? Arthritis   ? Hypertension   ? ? ?Past Surgical History:  ?Procedure Laterality Date  ? CLIPPING OF ATRIAL APPENDAGE N/A 03/04/2017  ? Procedure: CLIPPING OF ATRIAL APPENDAGE;  Surgeon: Ivin Poot, MD;  Location: [facility];  Service: Open Heart Surgery;  Laterality: N/A;  ? MAZE N/A 03/04/2017  ? Procedure: MAZE;  Surgeon: Ivin Poot, MD;  Location: [facility];  Service: Open Heart Surgery;  Laterality: N/A;  ? MITRAL VALVE  REPLACEMENT N/A 03/04/2017  ? Procedure: MITRAL VALVE (MV) REPLACEMENT, MAZE PROCEDURE;  Surgeon: Ivin Poot, MD;  Location: [facility];  Service: Open Heart Surgery;  Laterality: N/A;  ? RIGHT/LEFT HEART CATH AND CORONARY ANGIOGRAPHY N/A 03/02/2017  ? Procedure: RIGHT/LEFT HEART CATH AND CORONARY ANGIOGRAPHY;  Surgeon: Charolette Forward, MD;  Location: [facility];  Service: Cardiovascular;  Laterality: N/A;  ? ? ?Social History: ? reports that she has been smoking cigarettes. She has a 15.00 pack-year smoking history. She has quit using smokeless tobacco. She reports that she does not currently use alcohol. She reports that she does not use drugs. ? ?Allergies: ?No Known Allergies ? ?Family History:  ?Family History  ?Problem Relation Age of Onset  ? Lung cancer Mother   ? Lung cancer Sister   ? ? ? ?Current Outpatient Medications:  ?  acetaminophen (TYLENOL) 325 MG tablet, Take 650 mg by mouth every 6 (six) hours as needed for headache (pain)., Disp: , Rfl:  ?  amiodarone (PACERONE) 200 MG tablet, Take 1 tablet (200 mg total) by mouth 2 (two) times daily., Disp: 60 tablet, Rfl: 3 ?  ARIPiprazole (ABILIFY) 5 MG tablet, Take 5 mg by mouth at bedtime., Disp: , Rfl:  ?  calcium carbonate (TUMS - DOSED IN MG ELEMENTAL CALCIUM) 500 MG chewable tablet, Chew 1 tablet by mouth daily., Disp: , Rfl:  ?  docusate sodium (COLACE) 250 MG capsule, Take 250 mg by mouth daily., Disp: , Rfl:  ?  ergocalciferol (VITAMIN D2) 1.25 MG (50000 UT) capsule, Take 50,000 Units  by mouth once a week., Disp: , Rfl:  ?  metoprolol succinate (TOPROL-XL) 25 MG 24 hr tablet, Take by mouth., Disp: , Rfl:  ?  Oxymetazoline HCl (NASAL SPRAY) 0.05 % SOLN, Place into the nose., Disp: , Rfl:  ?  sertraline (ZOLOFT) 100 MG tablet, Take 100 mg by mouth daily., Disp: , Rfl:  ?  traZODone (DESYREL) 50 MG tablet, Take 50 mg by mouth at bedtime., Disp: , Rfl:  ?  vitamin B-12 (CYANOCOBALAMIN) 1000 MCG tablet, Take 1,000 mcg by mouth daily., Disp: , Rfl:   ?  warfarin (COUMADIN) 4 MG tablet, Take 1 tablet (4 mg total) by mouth daily at 6 PM., Disp: 35 tablet, Rfl: 3 ?  levothyroxine (SYNTHROID) 100 MCG tablet, Take 1 tablet (100 mcg total) by mouth daily before breakfast., Disp: 90 tablet, Rfl: 1 ? ?Review of Systems:  ?Constitutional: Denies fever, chills, diaphoresis, appetite change and fatigue.  ?HEENT: Denies photophobia, eye pain, redness, hearing loss, ear pain, congestion, sore throat, rhinorrhea, sneezing, mouth sores, trouble swallowing, neck pain, neck stiffness and tinnitus.   ?Respiratory: Denies SOB, DOE, cough, chest tightness,  and wheezing.   ?Cardiovascular: Denies chest pain, palpitations and leg swelling.  ?Gastrointestinal: Denies nausea, vomiting, abdominal pain, diarrhea, constipation, blood in stool and abdominal distention.  ?Genitourinary: Denies dysuria, urgency, frequency, hematuria, flank pain and difficulty urinating.  ?Endocrine: Denies: hot or cold intolerance, sweats, changes in hair or nails, polyuria, polydipsia. ?Musculoskeletal: Denies myalgias, back pain, joint swelling, arthralgias and gait problem.  ?Skin: Denies pallor, rash and wound.  ?Neurological: Denies dizziness, seizures, syncope, weakness, light-headedness, numbness and headaches.  ?Hematological: Denies adenopathy. Easy bruising, personal or family bleeding history  ?Psychiatric/Behavioral: Denies suicidal ideation, mood changes, confusion, nervousness, sleep disturbance and agitation ? ? ? ?Physical Exam: ?Vitals:  ? 06/10/21 1305  ?BP: 120/86  ?Pulse: 79  ?Temp: 98.1 ?F (36.7 ?C)  ?TempSrc: Oral  ?SpO2: 98%  ?Weight: 147 lb 14.4 oz (67.1 kg)  ?Height: 5\' 6"  (1.676 m)  ? ?Body mass index is 23.87 kg/m?. ? ?Constitutional: NAD, calm, comfortable ?Eyes: PERRL, lids and conjunctivae normal ?ENMT: Mucous membranes are moist.  ?Respiratory: clear to auscultation bilaterally, no wheezing, no crackles. Normal respiratory effort. No accessory muscle use.  ?Cardiovascular:  Regular rate and rhythm, no murmurs / rubs / gallops. No extremity edema.  ?Neurologic: Grossly intact and nonfocal ?Psychiatric: Normal judgment and insight. Alert and oriented x 3. Normal mood.  ? ? ?Impression and Plan: ? ?Paroxysmal atrial fibrillation ([facility]) ?s/P MVR (mitral valve replacement) ?-Followed by cardiology. ? ?Hypothyroidism, unspecified type  ?- Plan: levothyroxine (SYNTHROID) 100 MCG tablet ? ?Tobacco abuse ?-No time to discuss today. ? ?Depression, recurrent ([place]) ?-Followed by psychiatry on Abilify and sertraline. ? ?Time spent: 46 minutes reviewing chart, interviewing and examining patient and formulating plan of care. ? ? ? ?Patient Instructions  ?-Nice seeing you today!! ? ?-Return in 3 month for your physical. Please come in fasting. ? ? ? ?Lelon Frohlich, MD ?[facility] ? ?

## 2021-09-09 ENCOUNTER — Encounter: Payer: PRIVATE HEALTH INSURANCE | Admitting: Internal Medicine

## 2021-09-12 DIAGNOSIS — I4891 Unspecified atrial fibrillation: Secondary | ICD-10-CM | POA: Diagnosis not present

## 2021-09-24 DIAGNOSIS — I480 Paroxysmal atrial fibrillation: Secondary | ICD-10-CM | POA: Diagnosis not present

## 2021-09-24 DIAGNOSIS — E039 Hypothyroidism, unspecified: Secondary | ICD-10-CM | POA: Diagnosis not present

## 2021-09-24 DIAGNOSIS — E785 Hyperlipidemia, unspecified: Secondary | ICD-10-CM | POA: Diagnosis not present

## 2021-09-24 DIAGNOSIS — I10 Essential (primary) hypertension: Secondary | ICD-10-CM | POA: Diagnosis not present

## 2021-09-24 DIAGNOSIS — Z7901 Long term (current) use of anticoagulants: Secondary | ICD-10-CM | POA: Diagnosis not present

## 2021-10-07 DIAGNOSIS — I4891 Unspecified atrial fibrillation: Secondary | ICD-10-CM | POA: Diagnosis not present

## 2021-10-08 ENCOUNTER — Ambulatory Visit (INDEPENDENT_AMBULATORY_CARE_PROVIDER_SITE_OTHER): Payer: 59 | Admitting: Internal Medicine

## 2021-10-08 VITALS — BP 120/76 | HR 71 | Temp 97.9°F | Wt 143.6 lb

## 2021-10-08 DIAGNOSIS — E039 Hypothyroidism, unspecified: Secondary | ICD-10-CM | POA: Diagnosis not present

## 2021-10-08 DIAGNOSIS — M2042 Other hammer toe(s) (acquired), left foot: Secondary | ICD-10-CM

## 2021-10-08 DIAGNOSIS — R5383 Other fatigue: Secondary | ICD-10-CM

## 2021-10-08 LAB — COMPREHENSIVE METABOLIC PANEL
ALT: 16 U/L (ref 0–35)
AST: 22 U/L (ref 0–37)
Albumin: 4.2 g/dL (ref 3.5–5.2)
Alkaline Phosphatase: 56 U/L (ref 39–117)
BUN: 14 mg/dL (ref 6–23)
CO2: 27 mEq/L (ref 19–32)
Calcium: 8.8 mg/dL (ref 8.4–10.5)
Chloride: 103 mEq/L (ref 96–112)
Creatinine, Ser: 1.14 mg/dL (ref 0.40–1.20)
GFR: 51.01 mL/min — ABNORMAL LOW (ref >60.00)
Glucose, Bld: 83 mg/dL (ref 70–99)
Potassium: 4 mEq/L (ref 3.5–5.1)
Sodium: 137 mEq/L (ref 135–145)
Total Bilirubin: 0.4 mg/dL (ref 0.2–1.2)
Total Protein: 7.2 g/dL (ref 6.0–8.3)

## 2021-10-08 LAB — CBC WITH DIFFERENTIAL/PLATELET
Basophils Absolute: 0 10*3/uL (ref 0.0–0.1)
Basophils Relative: 0.5 % (ref 0.0–3.0)
Eosinophils Absolute: 0.1 10*3/uL (ref 0.0–0.7)
Eosinophils Relative: 1 % (ref 0.0–5.0)
HCT: 38.7 % (ref 36.0–46.0)
Hemoglobin: 12.8 g/dL (ref 12.0–15.0)
Lymphocytes Relative: 12.6 % (ref 12.0–46.0)
Lymphs Abs: 1 10*3/uL (ref 0.7–4.0)
MCHC: 33 g/dL (ref 30.0–36.0)
MCV: 88.3 fl (ref 78.0–100.0)
Monocytes Absolute: 0.4 10*3/uL (ref 0.1–1.0)
Monocytes Relative: 5.5 % (ref 3.0–12.0)
Neutro Abs: 6.2 10*3/uL (ref 1.4–7.7)
Neutrophils Relative %: 80.4 % — ABNORMAL HIGH (ref 43.0–77.0)
Platelets: 121 10*3/uL — ABNORMAL LOW (ref 150.0–400.0)
RBC: 4.38 Mil/uL (ref 3.87–5.11)
RDW: 18.9 % — ABNORMAL HIGH (ref 11.5–15.5)
WBC: 7.8 10*3/uL (ref 4.0–10.5)

## 2021-10-08 LAB — VITAMIN D 25 HYDROXY (VIT D DEFICIENCY, FRACTURES): VITD: 34.63 ng/mL (ref 30.00–100.00)

## 2021-10-08 LAB — VITAMIN B12: Vitamin B-12: 274 pg/mL (ref 211–911)

## 2021-10-08 LAB — TSH: TSH: 21.54 u[IU]/mL — ABNORMAL HIGH (ref 0.35–5.50)

## 2021-10-08 NOTE — Progress Notes (Addendum)
Established Patient Office Visit     CC/Reason for Visit: Discuss fatigue and left foot issue  HPI: Diane Nielsen is a 66 y.o. female who is coming in today for the above mentioned reasons.  She has been feeling very fatigued.  She wonders if B12 injections would help.  She has a history of hypothyroidism and has not had TSH levels checked in some time.  She has a left second toe hammertoe which causes a lot of callus formation and pain.  Past Medical/Surgical History: Past Medical History:  Diagnosis Date   Acute exacerbation of CHF (congestive heart failure) ([facility]) 02/27/2017   Arthritis    Hypertension     Past Surgical History:  Procedure Laterality Date   CLIPPING OF ATRIAL APPENDAGE N/A 03/04/2017   Procedure: CLIPPING OF ATRIAL APPENDAGE;  Surgeon: Kerin Perna, MD;  [facility];  Service: Open Heart Surgery;  Laterality: N/A;   MAZE N/A 03/04/2017   Procedure: MAZE;  Surgeon: Kerin Perna, MD;  Location: [facility];  Service: Open Heart Surgery;  Laterality: N/A;   MITRAL VALVE REPLACEMENT N/A 03/04/2017   Procedure: MITRAL VALVE (MV) REPLACEMENT, MAZE PROCEDURE;  Surgeon: Kerin Perna, MD;  Location: [facility];  Service: Open Heart Surgery;  Laterality: N/A;   RIGHT/LEFT HEART CATH AND CORONARY ANGIOGRAPHY N/A 03/02/2017   Procedure: RIGHT/LEFT HEART CATH AND CORONARY ANGIOGRAPHY;  Surgeon: Rinaldo Cloud, MD;  Location: [facility];  Service: Cardiovascular;  Laterality: N/A;    Social History:  reports that she has been smoking cigarettes. She has a 15.00 pack-year smoking history. She has quit using smokeless tobacco. She reports that she does not currently use alcohol. She reports that she does not use drugs.  Allergies: No Known Allergies  Family History:  Family History  Problem Relation Age of Onset   Lung cancer Mother    Lung cancer Sister      Current Outpatient Medications:    acetaminophen (TYLENOL) 325 MG tablet, Take 650 mg by  mouth every 6 (six) hours as needed for headache (pain)., Disp: , Rfl:    amiodarone (PACERONE) 200 MG tablet, Take 1 tablet (200 mg total) by mouth 2 (two) times daily. (Patient taking differently: Take 100 mg by mouth daily.), Disp: 60 tablet, Rfl: 3   ARIPiprazole (ABILIFY) 5 MG tablet, Take 5 mg by mouth at bedtime., Disp: , Rfl:    calcium carbonate (TUMS - DOSED IN MG ELEMENTAL CALCIUM) 500 MG chewable tablet, Chew 1 tablet by mouth daily., Disp: , Rfl:    docusate sodium (COLACE) 250 MG capsule, Take 250 mg by mouth daily., Disp: , Rfl:    levothyroxine (SYNTHROID) 100 MCG tablet, Take 1 tablet (100 mcg total) by mouth daily before breakfast., Disp: 90 tablet, Rfl: 1   metoprolol succinate (TOPROL-XL) 25 MG 24 hr tablet, Take by mouth., Disp: , Rfl:    Oxymetazoline HCl (NASAL SPRAY) 0.05 % SOLN, Place into the nose., Disp: , Rfl:    rosuvastatin (CRESTOR) 10 MG tablet, Take 10 mg by mouth at bedtime., Disp: , Rfl:    Sertraline HCl 150 MG CAPS, Take 1 capsule by mouth daily., Disp: , Rfl:    traZODone (DESYREL) 50 MG tablet, Take 150 mg by mouth at bedtime., Disp: , Rfl:    vitamin B-12 (CYANOCOBALAMIN) 1000 MCG tablet, Take 1,000 mcg by mouth daily., Disp: , Rfl:    warfarin (COUMADIN) 4 MG tablet, Take 1 tablet (4 mg total) by  mouth daily at 6 PM., Disp: 35 tablet, Rfl: 3   ergocalciferol (VITAMIN D2) 1.25 MG (50000 UT) capsule, Take 50,000 Units by mouth once a week. (Patient not taking: Reported on 10/08/2021), Disp: , Rfl:   Review of Systems:  Constitutional: Denies fever, chills, diaphoresis, appetite change. HEENT: Denies photophobia, eye pain, redness, hearing loss, ear pain, congestion, sore throat, rhinorrhea, sneezing, mouth sores, trouble swallowing, neck pain, neck stiffness and tinnitus.   Respiratory: Denies SOB, DOE, cough, chest tightness,  and wheezing.   Cardiovascular: Denies chest pain, palpitations and leg swelling.  Gastrointestinal: Denies nausea, vomiting,  abdominal pain, diarrhea, constipation, blood in stool and abdominal distention.  Genitourinary: Denies dysuria, urgency, frequency, hematuria, flank pain and difficulty urinating.  Endocrine: Denies: hot or cold intolerance, sweats, changes in hair or nails, polyuria, polydipsia. Musculoskeletal: Denies myalgias, back pain, joint swelling, arthralgias and gait problem.  Skin: Denies pallor, rash and wound.  Neurological: Denies dizziness, seizures, syncope, weakness, light-headedness, numbness and headaches.  Hematological: Denies adenopathy. Easy bruising, personal or family bleeding history  Psychiatric/Behavioral: Denies suicidal ideation, mood changes, confusion, nervousness, sleep disturbance and agitation    Physical Exam: Vitals:   10/08/21 1046  BP: 120/76  Pulse: 71  Temp: 97.9 F (36.6 C)  TempSrc: Oral  SpO2: 96%  Weight: 143 lb 9.6 oz (65.1 kg)    Body mass index is 23.18 kg/m.   Constitutional: NAD, calm, comfortable Eyes: PERRL, lids and conjunctivae normal ENMT: Mucous membranes are moist.  Respiratory: clear to auscultation bilaterally, no wheezing, no crackles. Normal respiratory effort. No accessory muscle use.  Cardiovascular: Regular rate and rhythm, no murmurs / rubs / gallops. No extremity edema.  Psychiatric: Normal judgment and insight. Alert and oriented x 3. Normal mood.    Impression and Plan:  Fatigue, unspecified type  - Plan: CBC with Differential/Platelet, Comprehensive metabolic panel, Vitamin B12, VITAMIN D 25 Hydroxy (Vit-D Deficiency, Fractures) -May need to adjust levothyroxine dose based on results.  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    Chaya Jan, MD [facility] at [facility]

## 2021-10-09 ENCOUNTER — Telehealth: Payer: Self-pay | Admitting: Internal Medicine

## 2021-10-09 NOTE — Telephone Encounter (Addendum)
Pt called returning CMA Alvino Chapel A Funderburk's request for a call back to discuss lab results.  CMA was unavailable Please call her back when you can.

## 2021-10-10 ENCOUNTER — Other Ambulatory Visit: Payer: Self-pay | Admitting: Internal Medicine

## 2021-10-10 DIAGNOSIS — E039 Hypothyroidism, unspecified: Secondary | ICD-10-CM

## 2021-10-10 MED ORDER — LEVOTHYROXINE SODIUM 125 MCG PO TABS: 125.0000 ug | ORAL_TABLET | Freq: Every day | ORAL | 1 refills | Status: DC

## 2021-10-10 NOTE — Addendum Note (Signed)
Addended by: Christy Sartorius on: 10/10/2021 04:41 PM   Modules accepted: Orders

## 2021-10-10 NOTE — Telephone Encounter (Signed)
Pt is calling and would like blood work result 

## 2021-10-10 NOTE — Telephone Encounter (Signed)
Patient informed of the result and expressed understanding

## 2021-10-25 DIAGNOSIS — I4891 Unspecified atrial fibrillation: Secondary | ICD-10-CM | POA: Diagnosis not present

## 2021-10-29 ENCOUNTER — Ambulatory Visit: Payer: 59 | Admitting: Podiatry

## 2021-10-29 ENCOUNTER — Ambulatory Visit (INDEPENDENT_AMBULATORY_CARE_PROVIDER_SITE_OTHER): Payer: 59

## 2021-10-29 DIAGNOSIS — M2042 Other hammer toe(s) (acquired), left foot: Secondary | ICD-10-CM | POA: Diagnosis not present

## 2021-10-29 DIAGNOSIS — M2012 Hallux valgus (acquired), left foot: Secondary | ICD-10-CM | POA: Diagnosis not present

## 2021-10-30 ENCOUNTER — Encounter: Payer: Self-pay | Admitting: Podiatry

## 2021-10-30 NOTE — Progress Notes (Signed)
  Subjective:  Patient ID: Diane Nielsen, female    DOB: [birth year],  MRN: [id number]  Chief Complaint  Patient presents with   Hammer Toe    LEFT FOOT, 2ND AND 3RD DIGIT, PAINFUL    66 y.o. female presents with the above complaint. History confirmed with patient.  States she may have broken the second toe before and never healed right.  She has a large painful callus on the inside of the second toe.  Objective:  Physical Exam: warm, good capillary refill, no trophic changes or ulcerative lesions, normal DP and PT pulses, and normal sensory exam. Left Foot:  Painful callus medial second toe PIPJ, rigid abduction deformity of the second toe and semirigid hammertoe deformities of the second third fourth and fifth with adductovarus of the fifth.  She also has hallux interphalangeus deformity with abutment of the hallux on the second toe   Radiographs: Multiple views x-ray of the left foot: Malunited fracture left second proximal phalanx with large spike of bone, she has hammertoe deformities of second third fourth and fifth toes, the hallux shows hallux interphalangeus deformity Assessment:   1. Hammer toe of left foot   2. Acquired hallux interphalangeus, left      Plan:  Patient was evaluated and treated and all questions answered.  Discussed the etiology and treatment including surgical and non surgical treatment for her painful great toe and lesser hammertoes.  We reviewed her x-rays in detail together.  She has evidence of prior fracture with malunion.  She has exhausted all non surgical treatment prior to this visit including shoe gear changes and padding.  She desires surgical intervention. We discussed all risks including but not limited to: pain, swelling, infection, scar, numbness which may be temporary or permanent, chronic pain, stiffness, nerve pain or damage, wound healing problems, bone healing problems including delayed or non-union and recurrence.  We discussed the risk  of smoking and its impact on bone healing and wound healing.  I advised her to stop smoking before and after surgery and she will work on this.  I advised her that I would rather her even use nicotine patches or gum as opposed to smoking.  Specifically we discussed the following procedures: Hammertoe correction of second third fourth and fifth toes, Akin osteotomy with bunionectomy of the great toe. Informed consent was signed today. Surgery will be scheduled at a mutually agreeable date. Information regarding this will be forwarded to our surgery scheduler.  She will require cardiac clearance prior to surgery   Surgical plan:  Procedure: -Left foot bunionectomy with phalangeal osteotomy (Aikn), hammertoe and malunion fracture repair of the second third fourth and fifth toes  Location: -[facility]  Anesthesia plan: -IV sedation with regional block  Postoperative pain plan: - Tylenol 1000 mg every 6 hours, ibuprofen 600 mg every 6 hours, gabapentin 300 mg every 8 hours x5 days, oxycodone 5 mg 1-2 tabs every 6 hours only as needed  DVT prophylaxis: -We will resume her warfarin postop  WB Restrictions / DME needs: -WBAT in CAM boot postop, this was dispensed today  No follow-ups on file.

## 2021-11-21 DIAGNOSIS — F201 Disorganized schizophrenia: Secondary | ICD-10-CM | POA: Diagnosis not present

## 2021-11-21 DIAGNOSIS — R69 Illness, unspecified: Secondary | ICD-10-CM | POA: Diagnosis not present

## 2021-11-27 DIAGNOSIS — I4891 Unspecified atrial fibrillation: Secondary | ICD-10-CM | POA: Diagnosis not present

## 2021-12-05 ENCOUNTER — Encounter: Payer: Self-pay | Admitting: Internal Medicine

## 2021-12-05 ENCOUNTER — Ambulatory Visit (INDEPENDENT_AMBULATORY_CARE_PROVIDER_SITE_OTHER): Payer: 59 | Admitting: Internal Medicine

## 2021-12-05 VITALS — BP 120/80 | HR 76 | Temp 97.7°F | Ht 65.0 in | Wt 142.0 lb

## 2021-12-05 DIAGNOSIS — Z952 Presence of prosthetic heart valve: Secondary | ICD-10-CM

## 2021-12-05 DIAGNOSIS — I272 Pulmonary hypertension, unspecified: Secondary | ICD-10-CM | POA: Diagnosis not present

## 2021-12-05 DIAGNOSIS — I480 Paroxysmal atrial fibrillation: Secondary | ICD-10-CM | POA: Diagnosis not present

## 2021-12-05 DIAGNOSIS — Z124 Encounter for screening for malignant neoplasm of cervix: Secondary | ICD-10-CM | POA: Diagnosis not present

## 2021-12-05 DIAGNOSIS — Z0000 Encounter for general adult medical examination without abnormal findings: Secondary | ICD-10-CM

## 2021-12-05 DIAGNOSIS — E039 Hypothyroidism, unspecified: Secondary | ICD-10-CM | POA: Diagnosis not present

## 2021-12-05 DIAGNOSIS — Z720 Tobacco use: Secondary | ICD-10-CM

## 2021-12-05 LAB — LIPID PANEL
Cholesterol: 103 mg/dL (ref 0–200)
HDL: 43 mg/dL (ref >39.00)
LDL Cholesterol: 39 mg/dL (ref 0–99)
NonHDL: 60.06
Total CHOL/HDL Ratio: 2
Triglycerides: 105 mg/dL (ref 0.0–149.0)
VLDL: 21 mg/dL (ref 0.0–40.0)

## 2021-12-05 LAB — TSH: TSH: 3.69 u[IU]/mL (ref 0.35–5.50)

## 2021-12-05 NOTE — Progress Notes (Signed)
Established Patient Office Visit     CC/Reason for Visit: Annual preventive exam  HPI: Diane Nielsen is a 66 y.o. female who is coming in today for the above mentioned reasons. Past Medical History is significant for: Hypothyroidism, atrial fibrillation, status post mitral valve replacement, pulmonary hypertension, ongoing tobacco use.  She has routine eye care, does not have dental care.  She is requesting a flu vaccine and cervical cancer screening.   Past Medical/Surgical History: Past Medical History:  Diagnosis Date   Acute exacerbation of CHF (congestive heart failure) ([place]) 02/27/2017   Arthritis    Hypertension     Past Surgical History:  Procedure Laterality Date   CLIPPING OF ATRIAL APPENDAGE N/A 03/04/2017   Procedure: CLIPPING OF ATRIAL APPENDAGE;  Surgeon: Ivin Poot, MD;  Location: [facility];  Service: Open Heart Surgery;  Laterality: N/A;   MAZE N/A 03/04/2017   Procedure: MAZE;  Surgeon: Ivin Poot, MD;  Location: [facility];  Service: Open Heart Surgery;  Laterality: N/A;   MITRAL VALVE REPLACEMENT N/A 03/04/2017   Procedure: MITRAL VALVE (MV) REPLACEMENT, MAZE PROCEDURE;  Surgeon: Ivin Poot, MD;  Location: [facility];  Service: Open Heart Surgery;  Laterality: N/A;   RIGHT/LEFT HEART CATH AND CORONARY ANGIOGRAPHY N/A 03/02/2017   Procedure: RIGHT/LEFT HEART CATH AND CORONARY ANGIOGRAPHY;  Surgeon: Charolette Forward, MD;  Location: [facility];  Service: Cardiovascular;  Laterality: N/A;    Social History:  reports that she has been smoking cigarettes. She has a 15.00 pack-year smoking history. She has quit using smokeless tobacco. She reports that she does not currently use alcohol. She reports that she does not use drugs.  Allergies: No Known Allergies  Family History:  Family History  Problem Relation Age of Onset   Lung cancer Mother    Lung cancer Sister      Current Outpatient Medications:    acetaminophen (TYLENOL) 325 MG  tablet, Take 650 mg by mouth every 6 (six) hours as needed for headache (pain)., Disp: , Rfl:    amiodarone (PACERONE) 200 MG tablet, Take 1 tablet (200 mg total) by mouth 2 (two) times daily. (Patient taking differently: Take 100 mg by mouth daily.), Disp: 60 tablet, Rfl: 3   ARIPiprazole (ABILIFY) 5 MG tablet, Take 5 mg by mouth at bedtime., Disp: , Rfl:    docusate sodium (COLACE) 250 MG capsule, Take 250 mg by mouth daily., Disp: , Rfl:    levothyroxine (SYNTHROID) 125 MCG tablet, Take 1 tablet (125 mcg total) by mouth daily before breakfast., Disp: 90 tablet, Rfl: 1   metoprolol succinate (TOPROL-XL) 25 MG 24 hr tablet, Take by mouth., Disp: , Rfl:    Oxymetazoline HCl (NASAL SPRAY) 0.05 % SOLN, Place into the nose., Disp: , Rfl:    rosuvastatin (CRESTOR) 10 MG tablet, Take 10 mg by mouth at bedtime., Disp: , Rfl:    Sertraline HCl 150 MG CAPS, Take 1 capsule by mouth daily., Disp: , Rfl:    traZODone (DESYREL) 50 MG tablet, Take 150 mg by mouth at bedtime., Disp: , Rfl:    vitamin B-12 (CYANOCOBALAMIN) 1000 MCG tablet, Take 1,000 mcg by mouth daily., Disp: , Rfl:    warfarin (COUMADIN) 4 MG tablet, Take 1 tablet (4 mg total) by mouth daily at 6 PM., Disp: 35 tablet, Rfl: 3  Review of Systems:  Constitutional: Denies fever, chills, diaphoresis, appetite change and fatigue.  HEENT: Denies photophobia, eye pain, redness, hearing loss, ear pain,  congestion, sore throat, rhinorrhea, sneezing, mouth sores, trouble swallowing, neck pain, neck stiffness and tinnitus.   Respiratory: Denies SOB, DOE, cough, chest tightness,  and wheezing.   Cardiovascular: Denies chest pain, palpitations and leg swelling.  Gastrointestinal: Denies nausea, vomiting, abdominal pain, diarrhea, constipation, blood in stool and abdominal distention.  Genitourinary: Denies dysuria, urgency, frequency, hematuria, flank pain and difficulty urinating.  Endocrine: Denies: hot or cold intolerance, sweats, changes in hair or  nails, polyuria, polydipsia. Musculoskeletal: Denies myalgias, back pain, joint swelling, arthralgias and gait problem.  Skin: Denies pallor, rash and wound.  Neurological: Denies dizziness, seizures, syncope, weakness, light-headedness, numbness and headaches.  Hematological: Denies adenopathy. Easy bruising, personal or family bleeding history  Psychiatric/Behavioral: Denies suicidal ideation, mood changes, confusion, nervousness, sleep disturbance and agitation    Physical Exam: Vitals:   12/05/21 1304  BP: 120/80  Pulse: 76  Temp: 97.7 F (36.5 C)  TempSrc: Oral  SpO2: 98%  Weight: 142 lb (64.4 kg)  Height: 5\' 5"  (1.651 m)    Body mass index is 23.63 kg/m.   Constitutional: NAD, calm, comfortable Eyes: PERRL, lids and conjunctivae normal ENMT: Mucous membranes are moist. Posterior pharynx clear of any exudate or lesions. Normal dentition. Tympanic membrane is pearly white, no erythema or bulging. Neck: normal, supple, no masses, no thyromegaly Respiratory: clear to auscultation bilaterally, no wheezing, no crackles. Normal respiratory effort. No accessory muscle use.  Cardiovascular: Regular rate and rhythm, no murmurs / rubs / gallops. No extremity edema. 2+ pedal pulses. No carotid bruits.  Abdomen: no tenderness, no masses palpated. No hepatosplenomegaly. Bowel sounds positive.  Musculoskeletal: no clubbing / cyanosis. No joint deformity upper and lower extremities. Good ROM, no contractures. Normal muscle tone.  Skin: no rashes, lesions, ulcers. No induration Neurologic: CN 2-12 grossly intact. Sensation intact, DTR normal. Strength 5/5 in all 4.  Psychiatric: Normal judgment and insight. Alert and oriented x 3. Normal mood.    Impression and Plan:  Encounter for preventive health examination  Paroxysmal atrial fibrillation (HCC)  S/P MVR (mitral valve replacement)  Hypothyroidism, unspecified type - Plan: TSH  Tobacco abuse  Pulmonary hypertension (HCC) -  Plan: Lipid panel  Screening for malignant neoplasm of cervix - Plan: PAP [[place]]   -Recommend routine eye and dental care. -Immunizations: Flu vaccine in office today, she declines all others today. -Healthy lifestyle discussed in detail. -Labs to be updated today. -Colon cancer screening: Declines -Breast cancer screening: Declines -Cervical cancer screening: Pap smear done in office today -Lung cancer screening: Declines -Prostate cancer screening: Not applicable -DEXA: Not applicable  -She has declined all age-appropriate preventive healthcare with the exception of cervical cancer screening and flu vaccine today despite counseling.  She states she is having hammertoe surgery in November and this is all she can focus on for now.  We will readdress at next visit. -Not interested in discussing smoking cessation.     December, MD [facility] at [facility]

## 2021-12-10 ENCOUNTER — Telehealth: Payer: Self-pay | Admitting: Internal Medicine

## 2021-12-10 NOTE — Telephone Encounter (Signed)
Pt called, returning CMA's call. CMA was unavailable. Pt asked that CMA call back at her earliest convenience. 

## 2021-12-10 NOTE — Telephone Encounter (Signed)
Patient is aware of lab results.

## 2021-12-10 NOTE — Telephone Encounter (Signed)
Pt is calling and she is unable to get on her mychart and would like her blood work results

## 2021-12-10 NOTE — Telephone Encounter (Signed)
Left message on machine for patient to return our call 

## 2021-12-11 ENCOUNTER — Telehealth: Payer: Self-pay | Admitting: Urology

## 2021-12-11 NOTE — Telephone Encounter (Signed)
DOS - 01/10/22  CORRECTION BUNION BY PHALANX OSTEO 1ST LEFT --- [id number] HAMMERTOE REPAIR 2-4 LEFT --- [id number]  [employer] EFFECTIVE DATE - 10/08/21  SPOKE WITH RAIN N. WITH [employer] AND SHE STATED THAT FOR CPT CODES 76226 AND 33354 NO PRIOR AUTH IS REQUIRED.   REF # [id number]

## 2021-12-13 LAB — CYTOLOGY - PAP
Adequacy: ABNORMAL
Comment: NEGATIVE

## 2021-12-19 ENCOUNTER — Encounter: Payer: Self-pay | Admitting: Internal Medicine

## 2021-12-19 ENCOUNTER — Other Ambulatory Visit (HOSPITAL_COMMUNITY)
Admission: RE | Admit: 2021-12-19 | Discharge: 2021-12-19 | Disposition: A | Discharge status: Home / Self Care | Payer: 59 | Source: Ambulatory Visit | Attending: Internal Medicine | Admitting: Internal Medicine

## 2021-12-19 ENCOUNTER — Ambulatory Visit (INDEPENDENT_AMBULATORY_CARE_PROVIDER_SITE_OTHER): Payer: 59 | Admitting: Internal Medicine

## 2021-12-19 VITALS — BP 106/70 | HR 70 | Temp 97.5°F | Ht 65.0 in | Wt 147.2 lb

## 2021-12-19 DIAGNOSIS — Z124 Encounter for screening for malignant neoplasm of cervix: Secondary | ICD-10-CM

## 2021-12-19 NOTE — Progress Notes (Signed)
Established Patient Office Visit     CC/Reason for Visit: here for repeat pap smear  HPI: Diane Nielsen is a 66 y.o. female who is coming in today for the above mentioned reasons. Pap collected last visit was undiagnostic due to scant cellularity. She is here for recollection.   Past Medical/Surgical History: Past Medical History:  Diagnosis Date   Acute exacerbation of CHF (congestive heart failure) ([place]) 02/27/2017   Arthritis    Hypertension     Past Surgical History:  Procedure Laterality Date   CLIPPING OF ATRIAL APPENDAGE N/A 03/04/2017   Procedure: CLIPPING OF ATRIAL APPENDAGE;  Surgeon: Ivin Poot, MD;  Location: [facility];  Service: Open Heart Surgery;  Laterality: N/A;   MAZE N/A 03/04/2017   Procedure: MAZE;  Surgeon: Ivin Poot, MD;  Location: [facility];  Service: Open Heart Surgery;  Laterality: N/A;   MITRAL VALVE REPLACEMENT N/A 03/04/2017   Procedure: MITRAL VALVE (MV) REPLACEMENT, MAZE PROCEDURE;  Surgeon: Ivin Poot, MD;  Location: [facility];  Service: Open Heart Surgery;  Laterality: N/A;   RIGHT/LEFT HEART CATH AND CORONARY ANGIOGRAPHY N/A 03/02/2017   Procedure: RIGHT/LEFT HEART CATH AND CORONARY ANGIOGRAPHY;  Surgeon: Charolette Forward, MD;  Location: [facility];  Service: Cardiovascular;  Laterality: N/A;    Social History:  reports that she has been smoking cigarettes. She has a 15.00 pack-year smoking history. She has quit using smokeless tobacco. She reports that she does not currently use alcohol. She reports that she does not use drugs.  Allergies: No Known Allergies  Family History:  Family History  Problem Relation Age of Onset   Lung cancer Mother    Lung cancer Sister      Current Outpatient Medications:    acetaminophen (TYLENOL) 325 MG tablet, Take 650 mg by mouth every 6 (six) hours as needed for headache (pain)., Disp: , Rfl:    amiodarone (PACERONE) 200 MG tablet, Take 1 tablet (200 mg total) by mouth 2 (two)  times daily. (Patient taking differently: Take 100 mg by mouth daily. 1/2 tablet daily), Disp: 60 tablet, Rfl: 3   ARIPiprazole (ABILIFY) 5 MG tablet, Take 5 mg by mouth at bedtime., Disp: , Rfl:    docusate sodium (COLACE) 250 MG capsule, Take 250 mg by mouth daily., Disp: , Rfl:    levothyroxine (SYNTHROID) 125 MCG tablet, Take 1 tablet (125 mcg total) by mouth daily before breakfast., Disp: 90 tablet, Rfl: 1   metoprolol succinate (TOPROL-XL) 25 MG 24 hr tablet, Take by mouth., Disp: , Rfl:    Oxymetazoline HCl (NASAL SPRAY) 0.05 % SOLN, Place into the nose., Disp: , Rfl:    rosuvastatin (CRESTOR) 10 MG tablet, Take 10 mg by mouth at bedtime., Disp: , Rfl:    Sertraline HCl 150 MG CAPS, Take 1 capsule by mouth daily., Disp: , Rfl:    traZODone (DESYREL) 50 MG tablet, Take 150 mg by mouth at bedtime., Disp: , Rfl:    vitamin B-12 (CYANOCOBALAMIN) 1000 MCG tablet, Take 1,000 mcg by mouth daily., Disp: , Rfl:    warfarin (COUMADIN) 4 MG tablet, Take 1 tablet (4 mg total) by mouth daily at 6 PM., Disp: 35 tablet, Rfl: 3  Review of Systems:  Constitutional: Denies fever, chills, diaphoresis, appetite change and fatigue.  HEENT: Denies photophobia, eye pain, redness, hearing loss, ear pain, congestion, sore throat, rhinorrhea, sneezing, mouth sores, trouble swallowing, neck pain, neck stiffness and tinnitus.   Respiratory: Denies SOB, DOE,  cough, chest tightness,  and wheezing.   Cardiovascular: Denies chest pain, palpitations and leg swelling.  Gastrointestinal: Denies nausea, vomiting, abdominal pain, diarrhea, constipation, blood in stool and abdominal distention.  Genitourinary: Denies dysuria, urgency, frequency, hematuria, flank pain and difficulty urinating.  Endocrine: Denies: hot or cold intolerance, sweats, changes in hair or nails, polyuria, polydipsia. Musculoskeletal: Denies myalgias, back pain, joint swelling, arthralgias and gait problem.  Skin: Denies pallor, rash and wound.   Neurological: Denies dizziness, seizures, syncope, weakness, light-headedness, numbness and headaches.  Hematological: Denies adenopathy. Easy bruising, personal or family bleeding history  Psychiatric/Behavioral: Denies suicidal ideation, mood changes, confusion, nervousness, sleep disturbance and agitation    Physical Exam: Vitals:   12/19/21 1430  BP: 106/70  Pulse: 70  Temp: (!) 97.5 F (36.4 C)  TempSrc: Oral  SpO2: 96%  Weight: 147 lb 3.2 oz (66.8 kg)  Height: 5\' 5"  (1.651 m)    Body mass index is 24.5 kg/m.   Constitutional: NAD, calm, comfortable Eyes: PERRL, lids and conjunctivae normal ENMT: Mucous membranes are moist.   Psychiatric: Normal judgment and insight. Alert and oriented x 3. Normal mood.    Impression and Plan:  Cervical cancer screening - Plan: PAP [[place]], CANCELED: PAP [[place]]  -Cervix visualized, pap smear collected.  Time spent:20 minutes reviewing chart, interviewing and examining patient and formulating plan of care.       Lelon Frohlich, MD [facility] at [facility]

## 2021-12-20 LAB — CYTOLOGY - PAP
Comment: NEGATIVE
Diagnosis: NEGATIVE
High risk HPV: NEGATIVE

## 2021-12-24 DIAGNOSIS — I4891 Unspecified atrial fibrillation: Secondary | ICD-10-CM | POA: Diagnosis not present

## 2022-01-08 ENCOUNTER — Telehealth: Payer: Self-pay | Admitting: Internal Medicine

## 2022-01-09 NOTE — Telephone Encounter (Signed)
Error. Please disregard

## 2022-01-10 ENCOUNTER — Other Ambulatory Visit: Payer: Self-pay | Admitting: Podiatry

## 2022-01-10 DIAGNOSIS — M2042 Other hammer toe(s) (acquired), left foot: Secondary | ICD-10-CM | POA: Diagnosis not present

## 2022-01-10 DIAGNOSIS — M2012 Hallux valgus (acquired), left foot: Secondary | ICD-10-CM | POA: Diagnosis not present

## 2022-01-10 DIAGNOSIS — M205X2 Other deformities of toe(s) (acquired), left foot: Secondary | ICD-10-CM | POA: Diagnosis not present

## 2022-01-10 DIAGNOSIS — Q828 Other specified congenital malformations of skin: Secondary | ICD-10-CM | POA: Diagnosis not present

## 2022-01-10 DIAGNOSIS — L84 Corns and callosities: Secondary | ICD-10-CM | POA: Diagnosis not present

## 2022-01-10 DIAGNOSIS — S92512P Displaced fracture of proximal phalanx of left lesser toe(s), subsequent encounter for fracture with malunion: Secondary | ICD-10-CM | POA: Diagnosis not present

## 2022-01-10 MED ORDER — ACETAMINOPHEN 500 MG PO TABS: 1000.0000 mg | ORAL_TABLET | Freq: Four times a day (QID) | ORAL | 0 refills | Status: AC | PRN

## 2022-01-10 MED ORDER — GABAPENTIN 300 MG PO CAPS: 300.0000 mg | ORAL_CAPSULE | Freq: Three times a day (TID) | ORAL | 0 refills | Status: DC

## 2022-01-10 MED ORDER — OXYCODONE HCL 5 MG PO TABS: 5.0000 mg | ORAL_TABLET | ORAL | 0 refills | Status: DC | PRN

## 2022-01-10 MED ORDER — OXYCODONE HCL 5 MG PO TABS: 5.0000 mg | ORAL_TABLET | ORAL | 0 refills | Status: AC | PRN

## 2022-01-10 NOTE — Progress Notes (Signed)
Resent pain medication to updated pharmacy- pharmacy it was sent to did not take her insurance.

## 2022-01-10 NOTE — Progress Notes (Signed)
11/3 Akin and hammertoe correction

## 2022-01-14 ENCOUNTER — Telehealth: Payer: Self-pay | Admitting: *Deleted

## 2022-01-14 ENCOUNTER — Telehealth: Payer: Self-pay

## 2022-01-14 NOTE — Telephone Encounter (Signed)
Patient is wanting to know if she may discontinue her Gabapentin immediately, not in pain any longer. She has discontinued the oxycodone today as well.

## 2022-01-14 NOTE — Telephone Encounter (Signed)
Patient notified

## 2022-01-14 NOTE — Telephone Encounter (Signed)
Spoke with patient giving permission to discontinue Gabapentin per physician.

## 2022-01-15 ENCOUNTER — Telehealth: Payer: Self-pay | Admitting: Internal Medicine

## 2022-01-15 DIAGNOSIS — M2042 Other hammer toe(s) (acquired), left foot: Secondary | ICD-10-CM | POA: Diagnosis not present

## 2022-01-15 DIAGNOSIS — Z7901 Long term (current) use of anticoagulants: Secondary | ICD-10-CM | POA: Diagnosis not present

## 2022-01-15 DIAGNOSIS — M2012 Hallux valgus (acquired), left foot: Secondary | ICD-10-CM | POA: Diagnosis not present

## 2022-01-15 DIAGNOSIS — M21612 Bunion of left foot: Secondary | ICD-10-CM | POA: Diagnosis not present

## 2022-01-15 DIAGNOSIS — Z4789 Encounter for other orthopedic aftercare: Secondary | ICD-10-CM | POA: Diagnosis not present

## 2022-01-15 NOTE — Telephone Encounter (Signed)
I have not called this patient

## 2022-01-15 NOTE — Telephone Encounter (Signed)
Pt called, returning CMA's call. CMA was unavailable. Pt asked that CMA call back at their earliest convenience. 

## 2022-01-16 ENCOUNTER — Ambulatory Visit (INDEPENDENT_AMBULATORY_CARE_PROVIDER_SITE_OTHER): Payer: 59 | Admitting: Podiatry

## 2022-01-16 DIAGNOSIS — R69 Illness, unspecified: Secondary | ICD-10-CM | POA: Diagnosis not present

## 2022-01-16 DIAGNOSIS — M2012 Hallux valgus (acquired), left foot: Secondary | ICD-10-CM

## 2022-01-16 DIAGNOSIS — M2042 Other hammer toe(s) (acquired), left foot: Secondary | ICD-10-CM

## 2022-01-16 DIAGNOSIS — F29 Unspecified psychosis not due to a substance or known physiological condition: Secondary | ICD-10-CM | POA: Diagnosis not present

## 2022-01-16 NOTE — Telephone Encounter (Signed)
No further information needed 

## 2022-01-17 DIAGNOSIS — M2012 Hallux valgus (acquired), left foot: Secondary | ICD-10-CM | POA: Diagnosis not present

## 2022-01-17 DIAGNOSIS — M2042 Other hammer toe(s) (acquired), left foot: Secondary | ICD-10-CM | POA: Diagnosis not present

## 2022-01-17 DIAGNOSIS — Z7901 Long term (current) use of anticoagulants: Secondary | ICD-10-CM | POA: Diagnosis not present

## 2022-01-17 DIAGNOSIS — M21612 Bunion of left foot: Secondary | ICD-10-CM | POA: Diagnosis not present

## 2022-01-17 DIAGNOSIS — Z4789 Encounter for other orthopedic aftercare: Secondary | ICD-10-CM | POA: Diagnosis not present

## 2022-01-19 NOTE — Progress Notes (Signed)
  Subjective:  Patient ID: Diane Nielsen, female    DOB: Aug 08, 1955,  MRN: [id number]  Chief Complaint  Patient presents with   Routine Post Op    POV #1 DOS 01/10/2022 CORRECTION OF LT FOOT HAMMERTOES, GREAT TOE DEFORMITY Michele Mcalpine) TOES 1-5      66 y.o. female returns for post-op check.   Review of Systems: Negative except as noted in the HPI. Denies N/V/F/Ch.   Objective:  There were no vitals filed for this visit. There is no height or weight on file to calculate BMI. Constitutional Well developed. Well nourished.  Vascular Foot warm and well perfused. Capillary refill normal to all digits.  Calf is soft and supple, no posterior calf or knee pain, negative Homans' sign  Neurologic Normal speech. Oriented to person, place, and time. Epicritic sensation to light touch grossly present bilaterally.  Dermatologic Skin healing well without signs of infection. Skin edges well coapted without signs of infection.  Orthopedic: Tenderness to palpation noted about the surgical site.   Multiple view plain film radiographs: All implants in place and correct position, good correction noted Assessment:   1. Hammer toe of left foot   2. Acquired hallux interphalangeus, left    Plan:  Patient was evaluated and treated and all questions answered.  S/p foot surgery left -Progressing as expected post-operatively. -XR: Noted above no complications -WB Status: WBAT in cam walker boot -Sutures: Removed in 2 weeks. -Medications: No refills required -Foot redressed.  No follow-ups on file.

## 2022-01-22 DIAGNOSIS — Z4789 Encounter for other orthopedic aftercare: Secondary | ICD-10-CM | POA: Diagnosis not present

## 2022-01-22 DIAGNOSIS — M2042 Other hammer toe(s) (acquired), left foot: Secondary | ICD-10-CM | POA: Diagnosis not present

## 2022-01-22 DIAGNOSIS — M2012 Hallux valgus (acquired), left foot: Secondary | ICD-10-CM | POA: Diagnosis not present

## 2022-01-22 DIAGNOSIS — M21612 Bunion of left foot: Secondary | ICD-10-CM | POA: Diagnosis not present

## 2022-01-22 DIAGNOSIS — Z7901 Long term (current) use of anticoagulants: Secondary | ICD-10-CM | POA: Diagnosis not present

## 2022-01-24 DIAGNOSIS — M21612 Bunion of left foot: Secondary | ICD-10-CM | POA: Diagnosis not present

## 2022-01-24 DIAGNOSIS — Z4789 Encounter for other orthopedic aftercare: Secondary | ICD-10-CM | POA: Diagnosis not present

## 2022-01-24 DIAGNOSIS — Z7901 Long term (current) use of anticoagulants: Secondary | ICD-10-CM | POA: Diagnosis not present

## 2022-01-24 DIAGNOSIS — M2042 Other hammer toe(s) (acquired), left foot: Secondary | ICD-10-CM | POA: Diagnosis not present

## 2022-01-24 DIAGNOSIS — M2012 Hallux valgus (acquired), left foot: Secondary | ICD-10-CM | POA: Diagnosis not present

## 2022-01-26 DIAGNOSIS — Z4789 Encounter for other orthopedic aftercare: Secondary | ICD-10-CM | POA: Diagnosis not present

## 2022-01-26 DIAGNOSIS — Z7901 Long term (current) use of anticoagulants: Secondary | ICD-10-CM | POA: Diagnosis not present

## 2022-01-26 DIAGNOSIS — M2042 Other hammer toe(s) (acquired), left foot: Secondary | ICD-10-CM | POA: Diagnosis not present

## 2022-01-26 DIAGNOSIS — M2012 Hallux valgus (acquired), left foot: Secondary | ICD-10-CM | POA: Diagnosis not present

## 2022-01-26 DIAGNOSIS — M21612 Bunion of left foot: Secondary | ICD-10-CM | POA: Diagnosis not present

## 2022-01-28 ENCOUNTER — Encounter: Payer: 59 | Admitting: Podiatry

## 2022-02-04 DIAGNOSIS — I4891 Unspecified atrial fibrillation: Secondary | ICD-10-CM | POA: Diagnosis not present

## 2022-02-05 ENCOUNTER — Ambulatory Visit (INDEPENDENT_AMBULATORY_CARE_PROVIDER_SITE_OTHER): Payer: 59 | Admitting: Podiatry

## 2022-02-05 DIAGNOSIS — M2042 Other hammer toe(s) (acquired), left foot: Secondary | ICD-10-CM

## 2022-02-05 DIAGNOSIS — M2012 Hallux valgus (acquired), left foot: Secondary | ICD-10-CM

## 2022-02-05 NOTE — Progress Notes (Signed)
  Subjective:  Patient ID: Diane Nielsen, female    DOB: 06-17-1955,  MRN: [id number]  Chief Complaint  Patient presents with   Routine Post Op    POV #2 DOS 01/10/2022 CORRECTION OF LT FOOT HAMMERTOES, GREAT TOE DEFORMITY Michele Mcalpine) TOES 1-5      66 y.o. female returns for post-op check.   Review of Systems: Negative except as noted in the HPI. Denies N/V/F/Ch.   Objective:  There were no vitals filed for this visit. There is no height or weight on file to calculate BMI. Constitutional Well developed. Well nourished.  Vascular Foot warm and well perfused. Capillary refill normal to all digits.  Calf is soft and supple, no posterior calf or knee pain, negative Homans' sign  Neurologic Normal speech. Oriented to person, place, and time. Epicritic sensation to light touch grossly present bilaterally.  Dermatologic Skin healing well without signs of infection. Skin edges well coapted without signs of infection.  Orthopedic: Tenderness to palpation noted about the surgical site.   Multiple view plain film radiographs: All implants in place and correct position, good correction noted Assessment:   1. Hammer toe of left foot   2. Acquired hallux interphalangeus, left    Plan:  Patient was evaluated and treated and all questions answered.  S/p foot surgery left -All sutures removed today return in 3 weeks for pin removal and new radiographs  No follow-ups on file.

## 2022-02-06 DIAGNOSIS — Z4789 Encounter for other orthopedic aftercare: Secondary | ICD-10-CM | POA: Diagnosis not present

## 2022-02-06 DIAGNOSIS — Z7901 Long term (current) use of anticoagulants: Secondary | ICD-10-CM | POA: Diagnosis not present

## 2022-02-06 DIAGNOSIS — M21612 Bunion of left foot: Secondary | ICD-10-CM | POA: Diagnosis not present

## 2022-02-06 DIAGNOSIS — M2042 Other hammer toe(s) (acquired), left foot: Secondary | ICD-10-CM | POA: Diagnosis not present

## 2022-02-06 DIAGNOSIS — M2012 Hallux valgus (acquired), left foot: Secondary | ICD-10-CM | POA: Diagnosis not present

## 2022-02-18 ENCOUNTER — Ambulatory Visit (INDEPENDENT_AMBULATORY_CARE_PROVIDER_SITE_OTHER): Payer: 59 | Admitting: Podiatry

## 2022-02-18 ENCOUNTER — Ambulatory Visit (INDEPENDENT_AMBULATORY_CARE_PROVIDER_SITE_OTHER): Payer: 59

## 2022-02-18 DIAGNOSIS — M2012 Hallux valgus (acquired), left foot: Secondary | ICD-10-CM

## 2022-02-18 DIAGNOSIS — M2042 Other hammer toe(s) (acquired), left foot: Secondary | ICD-10-CM | POA: Diagnosis not present

## 2022-02-20 ENCOUNTER — Encounter: Payer: Self-pay | Admitting: Podiatry

## 2022-02-20 NOTE — Progress Notes (Signed)
  Subjective:  Patient ID: Diane Nielsen, female    DOB: 22-Mar-1955,  MRN: [id number]  Chief Complaint  Patient presents with   Routine Post Op    (xray)POV #3 DOS 01/10/2022 CORRECTION OF LT FOOT HAMMERTOES, GREAT TOE DEFORMITY Michele Mcalpine) TOES 1-5      66 y.o. female returns for post-op check.   Review of Systems: Negative except as noted in the HPI. Denies N/V/F/Ch.   Objective:  There were no vitals filed for this visit. There is no height or weight on file to calculate BMI. Constitutional Well developed. Well nourished.  Vascular Foot warm and well perfused. Capillary refill normal to all digits.  Calf is soft and supple, no posterior calf or knee pain, negative Homans' sign  Neurologic Normal speech. Oriented to person, place, and time. Epicritic sensation to light touch grossly present bilaterally.  Dermatologic Incisions well-healed and not hypertrophic  Orthopedic: Tenderness to palpation noted about the surgical site.   Multiple view plain film radiographs: Near complete healing across osteotomy and fusion sites Assessment:   1. Hammer toe of left foot   2. Acquired hallux interphalangeus, left    Plan:  Patient was evaluated and treated and all questions answered.  S/p foot surgery left -All Kirschner wires removed today return in 4 weeks for new radiographs and return to regular shoe gear.  Surgical shoe was dispensed today and she may begin WBAT in this  Return in about 4 weeks (around 03/18/2022) for post op (new x-rays).

## 2022-02-26 DIAGNOSIS — I4891 Unspecified atrial fibrillation: Secondary | ICD-10-CM | POA: Diagnosis not present

## 2022-02-27 ENCOUNTER — Other Ambulatory Visit: Payer: Self-pay | Admitting: Internal Medicine

## 2022-02-27 DIAGNOSIS — E039 Hypothyroidism, unspecified: Secondary | ICD-10-CM

## 2022-03-13 DIAGNOSIS — I4891 Unspecified atrial fibrillation: Secondary | ICD-10-CM | POA: Diagnosis not present

## 2022-03-18 ENCOUNTER — Ambulatory Visit (INDEPENDENT_AMBULATORY_CARE_PROVIDER_SITE_OTHER): Payer: 59

## 2022-03-18 ENCOUNTER — Encounter: Payer: Self-pay | Admitting: Podiatry

## 2022-03-18 ENCOUNTER — Ambulatory Visit: Payer: 59 | Admitting: Podiatry

## 2022-03-18 DIAGNOSIS — M2012 Hallux valgus (acquired), left foot: Secondary | ICD-10-CM | POA: Diagnosis not present

## 2022-03-18 DIAGNOSIS — M2042 Other hammer toe(s) (acquired), left foot: Secondary | ICD-10-CM

## 2022-03-18 NOTE — Progress Notes (Signed)
  Subjective:  Patient ID: Diane Nielsen, female    DOB: 1955-12-31,  MRN: [id number]  Chief Complaint  Patient presents with   Routine Post Op    POV #4 DOS 01/10/2022 CORRECTION OF LT FOOT HAMMERTOES, GREAT TOE DEFORMITY Diane Nielsen) TOES 1-5      67 y.o. female returns for post-op check.  Overall doing okay she does note some swelling in the toes but not having much pain  Review of Systems: Negative except as noted in the HPI. Denies N/V/F/Ch.   Objective:  There were no vitals filed for this visit. There is no height or weight on file to calculate BMI. Constitutional Well developed. Well nourished.  Vascular Foot warm and well perfused. Capillary refill normal to all digits.  Calf is soft and supple, no posterior calf or knee pain, negative Homans' sign  Neurologic Normal speech. Oriented to person, place, and time. Epicritic sensation to light touch grossly present bilaterally.  Dermatologic Incisions well-healed and not hypertrophic.  No signs of infection  Orthopedic: She has little to no tenderness of the toes.  Minor edema.  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 Hammer toe of left foot   2. Acquired hallux interphalangeus, left    Plan:  Patient was evaluated and treated and all questions answered.  S/p foot surgery left -We reviewed her radiographs.  Discussed that there is still some persistent bone healing that needs to happen but she would be able to to transition back to a regular supportive shoe such as her sneakers.  Avoid going barefoot for 1 more month.  We discussed the fourth toe residual deformity, so far she says it does not bother her we will keep an eye on this closely to monitor.  She says that her right foot has the same type of deformity and is not an issue  for her.  Return in about 2 months (around 05/17/2022) for surgery follow up (new xrays left foot).

## 2022-03-20 DIAGNOSIS — R69 Illness, unspecified: Secondary | ICD-10-CM | POA: Diagnosis not present

## 2022-03-20 DIAGNOSIS — F411 Generalized anxiety disorder: Secondary | ICD-10-CM | POA: Diagnosis not present

## 2022-04-08 DIAGNOSIS — I4891 Unspecified atrial fibrillation: Secondary | ICD-10-CM | POA: Diagnosis not present

## 2022-04-14 DIAGNOSIS — I4891 Unspecified atrial fibrillation: Secondary | ICD-10-CM | POA: Diagnosis not present

## 2022-04-30 DIAGNOSIS — I4891 Unspecified atrial fibrillation: Secondary | ICD-10-CM | POA: Diagnosis not present

## 2022-05-20 ENCOUNTER — Ambulatory Visit (INDEPENDENT_AMBULATORY_CARE_PROVIDER_SITE_OTHER): Payer: 59

## 2022-05-20 ENCOUNTER — Ambulatory Visit: Payer: 59 | Admitting: Podiatry

## 2022-05-20 DIAGNOSIS — M2012 Hallux valgus (acquired), left foot: Secondary | ICD-10-CM

## 2022-05-20 DIAGNOSIS — M2042 Other hammer toe(s) (acquired), left foot: Secondary | ICD-10-CM

## 2022-05-21 NOTE — Progress Notes (Signed)
  Subjective:  Patient ID: Diane Nielsen, female    DOB: 10/20/1955,  MRN: [id number]  Chief Complaint  Patient presents with   Hammer Toe    surgery follow up (new xrays left foot).DOS 01/10/22      67 y.o. female returns for post-op check.  She says she is doing very well she not having any pain  Review of Systems: Negative except as noted in the HPI. Denies N/V/F/Ch.   Objective:  There were no vitals filed for this visit. There is no height or weight on file to calculate BMI. Constitutional Well developed. Well nourished.  Vascular Foot warm and well perfused. Capillary refill normal to all digits.  Calf is soft and supple, no posterior calf or knee pain, negative Homans' sign  Neurologic Normal speech. Oriented to person, place, and time. Epicritic sensation to light touch grossly present bilaterally.  Dermatologic Incisions well-healed and not hypertrophic.  No signs of infection  Orthopedic: She has little to no tenderness of the toes.  Minor edema.  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 Hammer toe of left foot   2. Acquired hallux interphalangeus, left    Plan:  Patient was evaluated and treated and all questions answered.  S/p foot surgery left -We reviewed her radiographs.  She is doing very well.  She is not having any pain back to full activity and shoe gear.  We discussed the residual position of the fourth toe, she says that it does not bother her and she is overall satisfied with this.  She will continue regular shoe gear and activity.  Return to see me as needed if this gives her further issues  Return if symptoms worsen or fail to improve.

## 2022-06-05 DIAGNOSIS — I4891 Unspecified atrial fibrillation: Secondary | ICD-10-CM | POA: Diagnosis not present

## 2022-06-10 ENCOUNTER — Ambulatory Visit (INDEPENDENT_AMBULATORY_CARE_PROVIDER_SITE_OTHER): Payer: 59 | Admitting: Internal Medicine

## 2022-06-10 ENCOUNTER — Encounter: Payer: Self-pay | Admitting: Internal Medicine

## 2022-06-10 VITALS — BP 110/80 | HR 65 | Temp 97.7°F | Wt 149.1 lb

## 2022-06-10 DIAGNOSIS — Z1231 Encounter for screening mammogram for malignant neoplasm of breast: Secondary | ICD-10-CM

## 2022-06-10 DIAGNOSIS — E039 Hypothyroidism, unspecified: Secondary | ICD-10-CM

## 2022-06-10 DIAGNOSIS — Z122 Encounter for screening for malignant neoplasm of respiratory organs: Secondary | ICD-10-CM

## 2022-06-10 DIAGNOSIS — F17210 Nicotine dependence, cigarettes, uncomplicated: Secondary | ICD-10-CM | POA: Diagnosis not present

## 2022-06-10 MED ORDER — LEVOTHYROXINE SODIUM 125 MCG PO TABS
ORAL_TABLET | ORAL | 1 refills | Status: DC
Start: 2022-06-10 — End: 2022-10-28

## 2022-06-10 NOTE — Progress Notes (Signed)
Established Patient Office Visit     CC/Reason for Visit: 34-month follow-up chronic conditions  HPI: Diane Nielsen is a 67 y.o. female who is coming in today for the above mentioned reasons. Past Medical History is significant for: Hypothyroidism, atrial fibrillation, mitral valve replacement, pulmonary hypertension, ongoing tobacco use.  She is not interested in discussing smoking cessation.  She would like thyroid levels checked.  She is overdue for screening mammogram and lung cancer screening.   Past Medical/Surgical History: Past Medical History:  Diagnosis Date   Acute exacerbation of CHF (congestive heart failure) 02/27/2017   Arthritis    Hypertension     Past Surgical History:  Procedure Laterality Date   CLIPPING OF ATRIAL APPENDAGE N/A 03/04/2017   Procedure: CLIPPING OF ATRIAL APPENDAGE;  Surgeon: Ivin Poot, MD;  Location: [place];  Service: Open Heart Surgery;  Laterality: N/A;   MAZE N/A 03/04/2017   Procedure: MAZE;  Surgeon: Ivin Poot, MD;  Location: [place];  Service: Open Heart Surgery;  Laterality: N/A;   MITRAL VALVE REPLACEMENT N/A 03/04/2017   Procedure: MITRAL VALVE (MV) REPLACEMENT, MAZE PROCEDURE;  Surgeon: Ivin Poot, MD;  Location: [place];  Service: Open Heart Surgery;  Laterality: N/A;   RIGHT/LEFT HEART CATH AND CORONARY ANGIOGRAPHY N/A 03/02/2017   Procedure: RIGHT/LEFT HEART CATH AND CORONARY ANGIOGRAPHY;  Surgeon: Charolette Forward, MD;  Location: [facility];  Service: Cardiovascular;  Laterality: N/A;    Social History:  reports that she has been smoking cigarettes. She has a 15.00 pack-year smoking history. She has quit using smokeless tobacco. She reports that she does not currently use alcohol. She reports that she does not use drugs.  Allergies: No Known Allergies  Family History:  Family History  Problem Relation Age of Onset   Lung cancer Mother    Lung cancer Sister      Current Outpatient Medications:     amiodarone (PACERONE) 200 MG tablet, Take 1 tablet (200 mg total) by mouth 2 (two) times daily. (Patient taking differently: Take 100 mg by mouth daily. 1/2 tablet daily), Disp: 60 tablet, Rfl: 3   ARIPiprazole (ABILIFY) 5 MG tablet, Take 5 mg by mouth at bedtime., Disp: , Rfl:    docusate sodium (COLACE) 250 MG capsule, Take 250 mg by mouth daily., Disp: , Rfl:    metoprolol succinate (TOPROL-XL) 25 MG 24 hr tablet, Take by mouth., Disp: , Rfl:    Oxymetazoline HCl (NASAL SPRAY) 0.05 % SOLN, Place into the nose., Disp: , Rfl:    rosuvastatin (CRESTOR) 10 MG tablet, Take 10 mg by mouth at bedtime., Disp: , Rfl:    Sertraline HCl 150 MG CAPS, Take 1 capsule by mouth daily., Disp: , Rfl:    traZODone (DESYREL) 50 MG tablet, Take 150 mg by mouth at bedtime., Disp: , Rfl:    warfarin (COUMADIN) 4 MG tablet, Take 1 tablet (4 mg total) by mouth daily at 6 PM., Disp: 35 tablet, Rfl: 3   gabapentin (NEURONTIN) 300 MG capsule, Take 1 capsule (300 mg total) by mouth 3 (three) times daily for 7 days., Disp: 21 capsule, Rfl: 0   levothyroxine (SYNTHROID) 125 MCG tablet, TAKE 1 TABLET BY MOUTH EVERY DAY BEFORE BREAKFAST, Disp: 90 tablet, Rfl: 1  Review of Systems:  Negative unless indicated in HPI.   Physical Exam: Vitals:   06/10/22 1306  BP: 110/80  Pulse: 65  Temp: 97.7 F (36.5 C)  TempSrc: Oral  SpO2: 95%  Weight: 149 lb 1.6 oz (67.6 kg)    Body mass index is 24.81 kg/m.   Physical Exam Vitals reviewed.  Constitutional:      Appearance: Normal appearance.  HENT:     Head: Normocephalic and atraumatic.  Eyes:     Conjunctiva/sclera: Conjunctivae normal.     Pupils: Pupils are equal, round, and reactive to light.  Cardiovascular:     Rate and Rhythm: Normal rate and regular rhythm.  Pulmonary:     Effort: Pulmonary effort is normal.     Breath sounds: Normal breath sounds.  Skin:    General: Skin is warm and dry.  Neurological:     General: No focal deficit present.      Mental Status: She is alert and oriented to person, place, and time.  Psychiatric:        Mood and Affect: Mood normal.        Behavior: Behavior normal.        Thought Content: Thought content normal.        Judgment: Judgment normal.      Impression and Plan:  Encounter for screening mammogram for malignant neoplasm of breast - Plan: MM Digital Screening  Hypothyroidism, unspecified type - Plan: levothyroxine (SYNTHROID) 125 MCG tablet, TSH  Cigarette nicotine dependence without complication - Plan: Ambulatory Referral Lung [facility] Screening [facility]  Encounter for screening for lung cancer - Plan: Ambulatory Referral Lung Cancer Screening [facility]  -Check TSH, refill levothyroxine. -Sent for screening mammogram. -Sent for low-dose CT scan for lung cancer screening given ongoing smoking history.  Time spent:32 minutes reviewing chart, interviewing and examining patient and formulating plan of care.     Lelon Frohlich, MD [facility] at [facility]

## 2022-06-11 LAB — TSH: TSH: 1.51 u[IU]/mL (ref 0.35–5.50)

## 2022-06-20 DIAGNOSIS — F411 Generalized anxiety disorder: Secondary | ICD-10-CM | POA: Diagnosis not present

## 2022-06-20 DIAGNOSIS — F251 Schizoaffective disorder, depressive type: Secondary | ICD-10-CM | POA: Diagnosis not present

## 2022-06-25 DIAGNOSIS — I4891 Unspecified atrial fibrillation: Secondary | ICD-10-CM | POA: Diagnosis not present

## 2022-07-17 DIAGNOSIS — I4891 Unspecified atrial fibrillation: Secondary | ICD-10-CM | POA: Diagnosis not present

## 2022-07-21 ENCOUNTER — Ambulatory Visit
Admission: RE | Admit: 2022-07-21 | Discharge: 2022-07-21 | Disposition: A | Discharge status: Home / Self Care | Payer: 59 | Source: Ambulatory Visit | Attending: Internal Medicine | Admitting: Internal Medicine

## 2022-07-21 DIAGNOSIS — Z1231 Encounter for screening mammogram for malignant neoplasm of breast: Secondary | ICD-10-CM | POA: Diagnosis not present

## 2022-08-26 DIAGNOSIS — I4891 Unspecified atrial fibrillation: Secondary | ICD-10-CM | POA: Diagnosis not present

## 2022-09-17 DIAGNOSIS — E782 Mixed hyperlipidemia: Secondary | ICD-10-CM | POA: Diagnosis not present

## 2022-09-17 DIAGNOSIS — E039 Hypothyroidism, unspecified: Secondary | ICD-10-CM | POA: Diagnosis not present

## 2022-09-17 DIAGNOSIS — I10 Essential (primary) hypertension: Secondary | ICD-10-CM | POA: Diagnosis not present

## 2022-09-17 DIAGNOSIS — I340 Nonrheumatic mitral (valve) insufficiency: Secondary | ICD-10-CM | POA: Diagnosis not present

## 2022-09-23 ENCOUNTER — Encounter: Payer: Self-pay | Admitting: *Deleted

## 2022-09-29 DIAGNOSIS — I4891 Unspecified atrial fibrillation: Secondary | ICD-10-CM | POA: Diagnosis not present

## 2022-10-02 DIAGNOSIS — F251 Schizoaffective disorder, depressive type: Secondary | ICD-10-CM | POA: Diagnosis not present

## 2022-10-02 DIAGNOSIS — F411 Generalized anxiety disorder: Secondary | ICD-10-CM | POA: Diagnosis not present

## 2022-10-23 ENCOUNTER — Other Ambulatory Visit: Payer: Self-pay | Admitting: Internal Medicine

## 2022-10-23 DIAGNOSIS — E039 Hypothyroidism, unspecified: Secondary | ICD-10-CM

## 2022-10-29 DIAGNOSIS — I4891 Unspecified atrial fibrillation: Secondary | ICD-10-CM | POA: Diagnosis not present

## 2022-11-06 ENCOUNTER — Ambulatory Visit: Payer: Medicare (Managed Care) | Admitting: Internal Medicine

## 2022-11-06 ENCOUNTER — Encounter: Payer: Self-pay | Admitting: Internal Medicine

## 2022-11-06 VITALS — BP 130/84 | HR 70 | Temp 98.1°F | Wt 155.1 lb

## 2022-11-06 DIAGNOSIS — Z720 Tobacco use: Secondary | ICD-10-CM

## 2022-11-06 DIAGNOSIS — Z23 Encounter for immunization: Secondary | ICD-10-CM | POA: Diagnosis not present

## 2022-11-06 DIAGNOSIS — F17210 Nicotine dependence, cigarettes, uncomplicated: Secondary | ICD-10-CM

## 2022-11-06 MED ORDER — BUPROPION HCL ER (XL) 150 MG PO TB24
150.0000 mg | ORAL_TABLET | Freq: Every day | ORAL | 1 refills | Status: DC
Start: 2022-11-06 — End: 2023-02-16

## 2022-11-06 NOTE — Progress Notes (Signed)
Established Patient Office Visit     CC/Reason for Visit: Discuss smoking cessation  HPI: Diane Nielsen is a 67 y.o. female who is coming in today for the above mentioned reasons.  She is ready to quit smoking and would like to discuss with me today.  She has tried patches and lozenges in the past and has not been effective.  Requesting flu vaccine.   Past Medical/Surgical History: Past Medical History:  Diagnosis Date   Acute exacerbation of CHF (congestive heart failure) (HCC) 02/27/2017   Arthritis    Hypertension     Past Surgical History:  Procedure Laterality Date   CLIPPING OF ATRIAL APPENDAGE N/A 03/04/2017   Procedure: CLIPPING OF ATRIAL APPENDAGE;  Surgeon: Kerin Perna, MD;  [facility];  Service: Open Heart Surgery;  Laterality: N/A;   MAZE N/A 03/04/2017   Procedure: MAZE;  Surgeon: Kerin Perna, MD;  Location: [facility] OR;  Service: Open Heart Surgery;  Laterality: N/A;   MITRAL VALVE REPLACEMENT N/A 03/04/2017   Procedure: MITRAL VALVE (MV) REPLACEMENT, MAZE PROCEDURE;  Surgeon: Kerin Perna, MD;  Location: [facility] OR;  Service: Open Heart Surgery;  Laterality: N/A;   RIGHT/LEFT HEART CATH AND CORONARY ANGIOGRAPHY N/A 03/02/2017   Procedure: RIGHT/LEFT HEART CATH AND CORONARY ANGIOGRAPHY;  Surgeon: Rinaldo Cloud, MD;  [facility];  Service: Cardiovascular;  Laterality: N/A;    Social History:  reports that she has been smoking cigarettes. She started smoking about 36 years ago. She has a 15 pack-year smoking history. She has quit using smokeless tobacco. She reports that she does not currently use alcohol. She reports that she does not use drugs.  Allergies: No Known Allergies  Family History:  Family History  Problem Relation Age of Onset   Lung cancer Mother    Lung cancer Sister      Current Outpatient Medications:    amiodarone (PACERONE) 200 MG tablet, Take 1 tablet (200 mg total) by mouth 2 (two) times daily. (Patient  taking differently: Take 100 mg by mouth daily. 1/2 tablet daily), Disp: 60 tablet, Rfl: 3   ARIPiprazole (ABILIFY) 5 MG tablet, Take 5 mg by mouth at bedtime., Disp: , Rfl:    buPROPion (WELLBUTRIN XL) 150 MG 24 hr tablet, Take 1 tablet (150 mg total) by mouth daily., Disp: 90 tablet, Rfl: 1   docusate sodium (COLACE) 250 MG capsule, Take 250 mg by mouth daily., Disp: , Rfl:    levothyroxine (SYNTHROID) 125 MCG tablet, TAKE 1 TABLET BY MOUTH EVERY DAY BEFORE BREAKFAST, Disp: 90 tablet, Rfl: 1   metoprolol succinate (TOPROL-XL) 25 MG 24 hr tablet, Take by mouth., Disp: , Rfl:    Oxymetazoline HCl (NASAL SPRAY) 0.05 % SOLN, Place into the nose., Disp: , Rfl:    rosuvastatin (CRESTOR) 10 MG tablet, Take 10 mg by mouth at bedtime., Disp: , Rfl:    Sertraline HCl 150 MG CAPS, Take 1 capsule by mouth daily., Disp: , Rfl:    traZODone (DESYREL) 50 MG tablet, Take 150 mg by mouth at bedtime., Disp: , Rfl:    warfarin (COUMADIN) 4 MG tablet, Take 1 tablet (4 mg total) by mouth daily at 6 PM., Disp: 35 tablet, Rfl: 3   gabapentin (NEURONTIN) 300 MG capsule, Take 1 capsule (300 mg total) by mouth 3 (three) times daily for 7 days., Disp: 21 capsule, Rfl: 0  Review of Systems:  Negative unless indicated in HPI.   Physical Exam: Vitals:  11/06/22 1123  BP: 130/84  Pulse: 70  Temp: 98.1 F (36.7 C)  TempSrc: Oral  SpO2: 98%  Weight: 155 lb 1.6 oz (70.4 kg)    Body mass index is 25.81 kg/m.   Physical Exam Vitals reviewed.  Constitutional:      Appearance: Normal appearance.  HENT:     Head: Normocephalic and atraumatic.  Eyes:     Conjunctiva/sclera: Conjunctivae normal.     Pupils: Pupils are equal, round, and reactive to light.  Skin:    General: Skin is warm and dry.  Neurological:     General: No focal deficit present.     Mental Status: She is alert and oriented to person, place, and time.  Psychiatric:        Mood and Affect: Mood normal.        Behavior: Behavior normal.         Thought Content: Thought content normal.        Judgment: Judgment normal.      Impression and Plan:  Tobacco abuse -     buPROPion HCl ER (XL); Take 1 tablet (150 mg total) by mouth daily.  Dispense: 90 tablet; Refill: 1  -We discussed that is best to attack smoking cessation on multiple angles.  Start Wellbutrin 150 mg daily, information given on how to schedule cognitive behavioral therapy, have also advised that she follow-up with me in no longer than 8 weeks. -Flu vaccine administered in office today.   Time spent:31 minutes reviewing chart, interviewing and examining patient and formulating plan of care.     Chaya Jan, MD [facility]

## 2022-11-07 ENCOUNTER — Other Ambulatory Visit: Payer: Self-pay | Admitting: Emergency Medicine

## 2022-11-07 DIAGNOSIS — Z122 Encounter for screening for malignant neoplasm of respiratory organs: Secondary | ICD-10-CM

## 2022-11-07 DIAGNOSIS — Z87891 Personal history of nicotine dependence: Secondary | ICD-10-CM

## 2022-11-07 DIAGNOSIS — F17210 Nicotine dependence, cigarettes, uncomplicated: Secondary | ICD-10-CM

## 2022-11-17 NOTE — Addendum Note (Signed)
Addended by: Kern Reap B on: 11/17/2022 11:29 AM   Modules accepted: Orders

## 2022-11-26 DIAGNOSIS — I4891 Unspecified atrial fibrillation: Secondary | ICD-10-CM | POA: Diagnosis not present

## 2022-12-09 ENCOUNTER — Encounter: Payer: Medicare (Managed Care) | Admitting: Acute Care

## 2022-12-10 ENCOUNTER — Ambulatory Visit: Payer: Medicare (Managed Care) | Admitting: Nurse Practitioner

## 2022-12-10 DIAGNOSIS — F17210 Nicotine dependence, cigarettes, uncomplicated: Secondary | ICD-10-CM | POA: Diagnosis not present

## 2022-12-10 NOTE — Patient Instructions (Signed)

## 2022-12-10 NOTE — Progress Notes (Signed)
Virtual Visit via Telephone Note  I connected with PERPETUA ELLING on 12/10/22 at  3:30 PM EDT by telephone and verified that I am speaking with the correct person using two identifiers.  Location:  remote  Patient: Viann Shove Provider: Posey Boyer   Shared Decision Making Visit Lung Cancer Screening Program [phone])   Eligibility: Age 66 y.o. Pack Years Smoking History Calculation 51 (# packs/per year x # years smoked) Recent History of coughing up blood  no Unexplained weight loss? no ( >Than 15 pounds within the last 6 months ) Prior History Lung / other cancer no (Diagnosis within the last 5 years already requiring surveillance chest CT Scans). Smoking Status Current Smoker  Visit Components: Discussion included one or more decision making aids. yes Discussion included risk/benefits of screening. yes Discussion included potential follow up diagnostic testing for abnormal scans. yes Discussion included meaning and risk of over diagnosis. yes Discussion included meaning and risk of False Positives. yes Discussion included meaning of total radiation exposure. yes  Counseling Included: Importance of adherence to annual lung cancer LDCT screening. yes Impact of comorbidities on ability to participate in the program. yes Ability and willingness to under diagnostic treatment. yes  Smoking Cessation Counseling: Current Smokers:  Discussed importance of smoking cessation. yes Information about tobacco cessation classes and interventions provided to patient. yes Patient provided with "ticket" for LDCT Scan. N/a Symptomatic Patient. no  Counseling(Intermediate counseling: > three minutes) 99406 Diagnosis Code: Tobacco Use Z72.0 Written Order for Lung Cancer Screening with LDCT placed in Epic.  (CT Chest Lung Cancer Screening Low Dose W/O CM) [id number] Z12.2-Screening of respiratory organs Z87.891-Personal history of nicotine dependence   Posey Boyer,  NP

## 2022-12-15 ENCOUNTER — Inpatient Hospital Stay
Admission: RE | Admit: 2022-12-15 | Discharge: 2022-12-15 | Disposition: A | Discharge status: Home / Self Care | Payer: Medicare (Managed Care) | Source: Ambulatory Visit | Attending: Acute Care | Admitting: Acute Care

## 2022-12-15 DIAGNOSIS — Z122 Encounter for screening for malignant neoplasm of respiratory organs: Secondary | ICD-10-CM

## 2022-12-15 DIAGNOSIS — F17210 Nicotine dependence, cigarettes, uncomplicated: Secondary | ICD-10-CM

## 2022-12-15 DIAGNOSIS — Z87891 Personal history of nicotine dependence: Secondary | ICD-10-CM

## 2022-12-18 ENCOUNTER — Telehealth: Payer: Self-pay | Admitting: Acute Care

## 2022-12-18 NOTE — Telephone Encounter (Signed)
Returned call to patient who is requesting results of LDCT. Results not yet available. Patient verbalized understanding.  Will notify when results released.  Informed that results are averaging about a week to be available.

## 2022-12-22 NOTE — Telephone Encounter (Signed)
Patient called again looking for LDCT results.  Advised a request was called to radiology to please read her scan today or tomorrow since she is anxious for her results.  Patient should get results by tomorrow.  Patient acknowledged understanding.

## 2022-12-23 ENCOUNTER — Other Ambulatory Visit: Payer: Self-pay

## 2022-12-23 DIAGNOSIS — Z122 Encounter for screening for malignant neoplasm of respiratory organs: Secondary | ICD-10-CM

## 2022-12-23 DIAGNOSIS — F17210 Nicotine dependence, cigarettes, uncomplicated: Secondary | ICD-10-CM

## 2022-12-23 DIAGNOSIS — Z87891 Personal history of nicotine dependence: Secondary | ICD-10-CM

## 2022-12-23 NOTE — Telephone Encounter (Signed)
Left VM for patient that results are in and letter has been sent to [facility] but she may call us to review by phone, if questions.  Left our direct call back number.

## 2022-12-24 ENCOUNTER — Ambulatory Visit: Payer: Medicare (Managed Care) | Admitting: Adult Health

## 2022-12-24 ENCOUNTER — Encounter: Payer: Self-pay | Admitting: Adult Health

## 2022-12-24 VITALS — BP 130/88 | HR 82 | Temp 98.2°F | Ht 65.0 in | Wt 151.0 lb

## 2022-12-24 DIAGNOSIS — R062 Wheezing: Secondary | ICD-10-CM | POA: Diagnosis not present

## 2022-12-24 DIAGNOSIS — R052 Subacute cough: Secondary | ICD-10-CM

## 2022-12-24 DIAGNOSIS — Z720 Tobacco use: Secondary | ICD-10-CM

## 2022-12-24 MED ORDER — PREDNISONE 10 MG PO TABS
ORAL_TABLET | ORAL | 0 refills | Status: DC
Start: 2022-12-24 — End: 2023-01-22

## 2022-12-24 MED ORDER — CEPHALEXIN 500 MG PO CAPS
500.0000 mg | ORAL_CAPSULE | Freq: Two times a day (BID) | ORAL | 0 refills | Status: AC
Start: 2022-12-24 — End: 2023-01-03

## 2022-12-24 MED ORDER — ALBUTEROL SULFATE HFA 108 (90 BASE) MCG/ACT IN AERS: 2.0000 | INHALATION_SPRAY | Freq: Four times a day (QID) | RESPIRATORY_TRACT | 0 refills | Status: DC | PRN

## 2022-12-24 NOTE — Progress Notes (Signed)
Subjective:    Patient ID: Diane Nielsen, female    DOB: 08-Oct-1955, 67 y.o.   MRN: [id number]  Cough    67 year old female who  has a past medical history of Acute exacerbation of CHF (congestive heart failure) (HCC) (02/27/2017), Arthritis, and Hypertension.  She is a patient of Dr. Ardyth Harps who I am seeing today for an acute visit. She reports having a cough x 1 month. At home she has tried sudafed and mucinex without relief. Cough is productive and she hears " a lot of rattling in my chest". She has not had any fevers or chills. She does not feel SOB past baseline.   She is in the process of quitting smoking. Her PCP placed her on Wellbutrin and she is down from a pack a day to 3-4 cigarettes per day.    She is a smoker. Had CT lung cancer screening done last week which showed  Lung-RADS 2, benign appearance or behavior. Continue annual screening with low-dose chest CT without contrast in 12 months.   Aortic Atherosclerosis (ICD10-I70.0) and Emphysema (ICD10-J43.9).   Review of Systems  Respiratory:  Positive for cough.    See HPI   Past Medical History:  Diagnosis Date   Acute exacerbation of CHF (congestive heart failure) (HCC) 02/27/2017   Arthritis    Hypertension     Social History   Socioeconomic History   Marital status: Widowed    Spouse name: Not on file   Number of children: Not on file   Years of education: Not on file   Highest education level: Not on file  Occupational History   Not on file  Tobacco Use   Smoking status: Every Day    Current packs/day: 0.00    Average packs/day: 0.5 packs/day for 30.0 years (15.0 ttl pk-yrs)    Types: Cigarettes    Start date: 2    Last attempt to quit: 2018    Years since quitting: 6.7   Smokeless tobacco: Former  Substance and Sexual Activity   Alcohol use: Not Currently   Drug use: No   Sexual activity: Not on file  Other Topics Concern   Not on file  Social History Narrative   Not on file    Social Determinants of Health   Financial Resource Strain: Not on file  Food Insecurity: Not on file  Transportation Needs: Not on file  Physical Activity: Not on file  Stress: Not on file  Social Connections: Unknown (07/21/2021)   Received from [facility]   Social Network    Social Network: Not on file  Intimate Partner Violence: Unknown (06/13/2021)   Received from [facility]   HITS    Physically Hurt: Not on file    Insult or Talk Down To: Not on file    Threaten Physical Harm: Not on file    Scream or Curse: Not on file    Past Surgical History:  Procedure Laterality Date   CLIPPING OF ATRIAL APPENDAGE N/A 03/04/2017   Procedure: CLIPPING OF ATRIAL APPENDAGE;  Surgeon: Kerin Perna, MD;  [facility];  Service: Open Heart Surgery;  Laterality: N/A;   MAZE N/A 03/04/2017   Procedure: MAZE;  Surgeon: Kerin Perna, MD;  Location: [facility] OR;  Service: Open Heart Surgery;  Laterality: N/A;   MITRAL VALVE REPLACEMENT N/A 03/04/2017   Procedure: MITRAL VALVE (MV) REPLACEMENT, MAZE PROCEDURE;  Surgeon: Kerin Perna, MD;  Location: [facility];  Service: Open Heart Surgery;  Laterality: N/A;   RIGHT/LEFT HEART CATH AND CORONARY ANGIOGRAPHY N/A 03/02/2017   Procedure: RIGHT/LEFT HEART CATH AND CORONARY ANGIOGRAPHY;  Surgeon: Rinaldo Cloud, MD;  [facility];  Service: Cardiovascular;  Laterality: N/A;    Family History  Problem Relation Age of Onset   Lung cancer Mother    Lung cancer Sister     No Known Allergies  Current Outpatient Medications on File Prior to Visit  Medication Sig Dispense Refill   amiodarone (PACERONE) 200 MG tablet Take 1 tablet (200 mg total) by mouth 2 (two) times daily. (Patient taking differently: Take 100 mg by mouth daily. 1/2 tablet daily) 60 tablet 3   ARIPiprazole (ABILIFY) 5 MG tablet Take 5 mg by mouth at bedtime.     buPROPion (WELLBUTRIN XL) 150 MG 24 hr tablet Take 1 tablet (150 mg  total) by mouth daily. 90 tablet 1   docusate sodium (COLACE) 250 MG capsule Take 250 mg by mouth daily.     gabapentin (NEURONTIN) 300 MG capsule Take 1 capsule (300 mg total) by mouth 3 (three) times daily for 7 days. 21 capsule 0   levothyroxine (SYNTHROID) 125 MCG tablet TAKE 1 TABLET BY MOUTH EVERY DAY BEFORE BREAKFAST 90 tablet 1   metoprolol succinate (TOPROL-XL) 25 MG 24 hr tablet Take by mouth.     Oxymetazoline HCl (NASAL SPRAY) 0.05 % SOLN Place into the nose.     rosuvastatin (CRESTOR) 10 MG tablet Take 10 mg by mouth at bedtime.     Sertraline HCl 150 MG CAPS Take 1 capsule by mouth daily.     traZODone (DESYREL) 50 MG tablet Take 150 mg by mouth at bedtime.     warfarin (COUMADIN) 4 MG tablet Take 1 tablet (4 mg total) by mouth daily at 6 PM. 35 tablet 3   No current facility-administered medications on file prior to visit.    BP 130/88   Pulse 82   Temp 98.2 F (36.8 C) (Oral)   Ht 5\' 5"  (1.651 m)   Wt 151 lb (68.5 kg)   SpO2 99%   BMI 25.13 kg/m       Objective:   Physical Exam Vitals and nursing note reviewed.  Constitutional:      Appearance: Normal appearance.  Cardiovascular:     Rate and Rhythm: Regular rhythm.     Pulses: Normal pulses.     Heart sounds: Normal heart sounds.  Pulmonary:     Effort: Pulmonary effort is normal.     Breath sounds: Examination of the right-middle field reveals wheezing and rhonchi. Examination of the left-middle field reveals wheezing and rhonchi. Examination of the right-lower field reveals wheezing and rhonchi. Examination of the left-lower field reveals wheezing and rhonchi. Wheezing and rhonchi present. No rales.  Skin:    General: Skin is warm and dry.  Neurological:     General: No focal deficit present.     Mental Status: She is alert and oriented to person, place, and time.  Psychiatric:        Mood and Affect: Mood normal.        Behavior: Behavior normal.        Thought Content: Thought content normal.         Judgment: Judgment normal.       Assessment & Plan:  1. Subacute cough - Will send in prednisone, albuterol inhaler and Keflex to cover or developing PNA.  - predniSONE (DELTASONE) 10 MG tablet;  40 mg x 3 days, 20 mg x 3 days, 10 mg x 3 days  Dispense: 21 tablet; Refill: 0 - albuterol (VENTOLIN HFA) 108 (90 Base) MCG/ACT inhaler; Inhale 2 puffs into the lungs every 6 (six) hours as needed for wheezing or shortness of breath.  Dispense: 8 g; Refill: 0 - cephALEXin (KEFLEX) 500 MG capsule; Take 1 capsule (500 mg total) by mouth 2 (two) times daily for 10 days.  Dispense: 20 capsule; Refill: 0 - Follow up with PCP if not improving in the next 3-4 days  2. Tobacco use - Encouraged to quit completely.   3. Wheezing  - predniSONE (DELTASONE) 10 MG tablet; 40 mg x 3 days, 20 mg x 3 days, 10 mg x 3 days  Dispense: 21 tablet; Refill: 0 - albuterol (VENTOLIN HFA) 108 (90 Base) MCG/ACT inhaler; Inhale 2 puffs into the lungs every 6 (six) hours as needed for wheezing or shortness of breath.  Dispense: 8 g; Refill: 0 - cephALEXin (KEFLEX) 500 MG capsule; Take 1 capsule (500 mg total) by mouth 2 (two) times daily for 10 days.  Dispense: 20 capsule; Refill: 0  Shirline Frees, NP  Time spent with patient today was 31 minutes which consisted of chart review, discussing tobacco use, cough and wheezing, work up, treatment answering questions and documentation.

## 2022-12-24 NOTE — Telephone Encounter (Signed)
Patient returned call. Discussed results with pt and recommendations to have 12 month annual scan. Patient is aware of emphysema and denies any trouble breathing. Atherosclerosis was noted, patient is on a statin medication. Annual scan has already been ordered.

## 2022-12-31 DIAGNOSIS — I4891 Unspecified atrial fibrillation: Secondary | ICD-10-CM | POA: Diagnosis not present

## 2023-01-01 ENCOUNTER — Ambulatory Visit: Payer: Medicare (Managed Care) | Admitting: Internal Medicine

## 2023-01-05 DIAGNOSIS — I4891 Unspecified atrial fibrillation: Secondary | ICD-10-CM | POA: Diagnosis not present

## 2023-01-06 DIAGNOSIS — F411 Generalized anxiety disorder: Secondary | ICD-10-CM | POA: Diagnosis not present

## 2023-01-06 DIAGNOSIS — F251 Schizoaffective disorder, depressive type: Secondary | ICD-10-CM | POA: Diagnosis not present

## 2023-01-10 ENCOUNTER — Other Ambulatory Visit: Payer: Self-pay | Admitting: Adult Health

## 2023-01-10 DIAGNOSIS — R062 Wheezing: Secondary | ICD-10-CM

## 2023-01-10 DIAGNOSIS — R052 Subacute cough: Secondary | ICD-10-CM

## 2023-01-20 DIAGNOSIS — I4891 Unspecified atrial fibrillation: Secondary | ICD-10-CM | POA: Diagnosis not present

## 2023-01-21 ENCOUNTER — Ambulatory Visit: Payer: Medicare (Managed Care) | Admitting: Internal Medicine

## 2023-01-22 ENCOUNTER — Ambulatory Visit (INDEPENDENT_AMBULATORY_CARE_PROVIDER_SITE_OTHER): Payer: Medicare (Managed Care) | Admitting: Family Medicine

## 2023-01-22 ENCOUNTER — Encounter: Payer: Self-pay | Admitting: Family Medicine

## 2023-01-22 VITALS — BP 124/80 | HR 73 | Temp 98.3°F | Wt 153.0 lb

## 2023-01-22 DIAGNOSIS — J40 Bronchitis, not specified as acute or chronic: Secondary | ICD-10-CM

## 2023-01-22 MED ORDER — DOXYCYCLINE HYCLATE 100 MG PO TABS: 100.0000 mg | ORAL_TABLET | Freq: Two times a day (BID) | ORAL | 0 refills | Status: DC

## 2023-01-22 NOTE — Progress Notes (Signed)
   Subjective:    Patient ID: Diane Nielsen, female    DOB: 1955/10/22, 67 y.o.   MRN: [id number]  HPI Here for continued coughing after a visit here on 12-24-22. At that point she had been coughing for 4 weeks. The cough had been non-productive, but she was wheezing and was SOB. No chest pain or fever. At the visit here she had wheezing and rhonchi on exam. She was treated with 10 days of Keflex and a Prednisone taper. She was also given an albuterol inhaler. Since then the cough has improved, but it persists. She feels less SOB.    Review of Systems  Constitutional: Negative.   HENT: Negative.    Eyes: Negative.   Respiratory:  Positive for cough. Negative for shortness of breath and wheezing.        Objective:   Physical Exam Constitutional:      General: She is not in acute distress.    Appearance: Normal appearance.  Cardiovascular:     Rate and Rhythm: Normal rate and regular rhythm.     Pulses: Normal pulses.     Heart sounds: Normal heart sounds.  Pulmonary:     Effort: Pulmonary effort is normal.     Breath sounds: Normal breath sounds.  Neurological:     Mental Status: She is alert.           Assessment & Plan:  Partially treated bronchitis. We will give her 10 days of Doxycycline. She will follow up as needed.  Gershon Crane, MD

## 2023-01-23 DIAGNOSIS — I4891 Unspecified atrial fibrillation: Secondary | ICD-10-CM | POA: Diagnosis not present

## 2023-02-06 ENCOUNTER — Other Ambulatory Visit: Payer: Self-pay | Admitting: Internal Medicine

## 2023-02-06 DIAGNOSIS — R062 Wheezing: Secondary | ICD-10-CM

## 2023-02-06 DIAGNOSIS — R052 Subacute cough: Secondary | ICD-10-CM

## 2023-02-13 DIAGNOSIS — I4891 Unspecified atrial fibrillation: Secondary | ICD-10-CM | POA: Diagnosis not present

## 2023-02-16 ENCOUNTER — Other Ambulatory Visit: Payer: Self-pay | Admitting: Internal Medicine

## 2023-02-16 DIAGNOSIS — Z720 Tobacco use: Secondary | ICD-10-CM

## 2023-03-17 ENCOUNTER — Other Ambulatory Visit: Payer: Self-pay | Admitting: Internal Medicine

## 2023-03-17 DIAGNOSIS — R062 Wheezing: Secondary | ICD-10-CM

## 2023-03-17 DIAGNOSIS — R052 Subacute cough: Secondary | ICD-10-CM

## 2023-04-15 ENCOUNTER — Encounter: Payer: Self-pay | Admitting: Internal Medicine

## 2023-04-15 ENCOUNTER — Ambulatory Visit: Payer: Medicare (Managed Care) | Admitting: Internal Medicine

## 2023-04-15 VITALS — BP 124/84 | HR 82 | Temp 97.7°F | Wt 160.7 lb

## 2023-04-15 DIAGNOSIS — R2681 Unsteadiness on feet: Secondary | ICD-10-CM | POA: Diagnosis not present

## 2023-04-15 NOTE — Progress Notes (Signed)
 Established Patient Office Visit     CC/Reason for Visit: Left hip pain  HPI: Diane Nielsen is a 68 y.o. female who is coming in today for the above mentioned reasons.  When further questioning she admits that there is no pain or discomfort in her hip or in any part of her lower body.  She does admit that her gait has been a little unsteady and she wonders if she needs physical therapy to work on that.  She had a series of falls over 2 years ago and has felt unsteady since then.   Past Medical/Surgical History: Past Medical History:  Diagnosis Date   Acute exacerbation of CHF (congestive heart failure) (HCC) 02/27/2017   Arthritis    Hypertension     Past Surgical History:  Procedure Laterality Date   CLIPPING OF ATRIAL APPENDAGE N/A 03/04/2017   Procedure: CLIPPING OF ATRIAL APPENDAGE;  Surgeon: Fleeta Hanford Coy, MD;  Location: [facility] OR;  Service: Open Heart Surgery;  Laterality: N/A;   MAZE N/A 03/04/2017   Procedure: MAZE;  Surgeon: Fleeta Hanford Coy, MD;  Location: [facility] OR;  Service: Open Heart Surgery;  Laterality: N/A;   MITRAL VALVE REPLACEMENT N/A 03/04/2017   Procedure: MITRAL VALVE (MV) REPLACEMENT, MAZE PROCEDURE;  Surgeon: Fleeta Hanford Coy, MD;  Location: [facility] OR;  Service: Open Heart Surgery;  Laterality: N/A;   RIGHT/LEFT HEART CATH AND CORONARY ANGIOGRAPHY N/A 03/02/2017   Procedure: RIGHT/LEFT HEART CATH AND CORONARY ANGIOGRAPHY;  Surgeon: Levern Hutching, MD;  Location: [facility];  Service: Cardiovascular;  Laterality: N/A;    Social History:  reports that she has been smoking cigarettes. She started smoking about 37 years ago. She has a 15 pack-year smoking history. She has quit using smokeless tobacco. She reports that she does not currently use alcohol. She reports that she does not use drugs.  Allergies: No Known Allergies  Family History:  Family History  Problem Relation Age of Onset   Lung cancer Mother    Lung cancer Sister       Current Outpatient Medications:    albuterol  (VENTOLIN  HFA) 108 (90 Base) MCG/ACT inhaler, TAKE 2 PUFFS BY MOUTH EVERY 6 HOURS AS NEEDED FOR WHEEZE OR SHORTNESS OF BREATH, Disp: 18 each, Rfl: 0   amiodarone  (PACERONE ) 200 MG tablet, Take 1 tablet (200 mg total) by mouth 2 (two) times daily. (Patient taking differently: Take 100 mg by mouth daily. 1/2 tablet daily), Disp: 60 tablet, Rfl: 3   ARIPiprazole (ABILIFY) 5 MG tablet, Take 5 mg by mouth at bedtime., Disp: , Rfl:    buPROPion  (WELLBUTRIN  XL) 150 MG 24 hr tablet, TAKE 1 TABLET BY MOUTH EVERY DAY, Disp: 90 tablet, Rfl: 1   docusate sodium  (COLACE) 250 MG capsule, Take 250 mg by mouth daily., Disp: , Rfl:    levothyroxine  (SYNTHROID ) 125 MCG tablet, TAKE 1 TABLET BY MOUTH EVERY DAY BEFORE BREAKFAST, Disp: 90 tablet, Rfl: 1   losartan  (COZAAR ) 25 MG tablet, Take 25 mg by mouth daily., Disp: , Rfl:    Oxymetazoline HCl (NASAL SPRAY) 0.05 % SOLN, Place into the nose., Disp: , Rfl:    rosuvastatin (CRESTOR) 10 MG tablet, Take 10 mg by mouth at bedtime., Disp: , Rfl:    Sertraline HCl 150 MG CAPS, Take 1 capsule by mouth daily., Disp: , Rfl:    traZODone (DESYREL) 50 MG tablet, Take 150 mg by mouth at bedtime., Disp: , Rfl:    warfarin (COUMADIN ) 4 MG tablet,  Take 1 tablet (4 mg total) by mouth daily at 6 PM., Disp: 35 tablet, Rfl: 3   gabapentin  (NEURONTIN ) 300 MG capsule, Take 1 capsule (300 mg total) by mouth 3 (three) times daily for 7 days., Disp: 21 capsule, Rfl: 0  Review of Systems:  Negative unless indicated in HPI.   Physical Exam: Vitals:   04/15/23 1312  BP: 124/84  Pulse: 82  Temp: 97.7 F (36.5 C)  TempSrc: Oral  SpO2: 96%  Weight: 160 lb 11.2 oz (72.9 kg)    Body mass index is 26.74 kg/m.    Impression and Plan:  Unsteady gait when walking -     Ambulatory referral to Physical Therapy   -Refer to physical therapy for gait training and strengthening.  Time spent:21 minutes reviewing chart, interviewing  and examining patient and formulating plan of care.     [name], MD [facility]

## 2023-04-20 ENCOUNTER — Telehealth: Payer: Self-pay

## 2023-04-20 NOTE — Telephone Encounter (Signed)
 Pt was given their phone number [phone] to cancel or rsc

## 2023-04-20 NOTE — Telephone Encounter (Signed)
 Copied from CRM [id number]. Topic: Appointments - Appointment Cancel/Reschedule >> Apr 20, 2023  1:49 PM Howard Macho wrote: Patient/patient representative is calling to cancel or reschedule an appointment. Refer to attachments for appointment information. Patient called stating she would like to cancel her evaluation ortho appointment with Jinx Mourning on 2/11

## 2023-04-21 ENCOUNTER — Ambulatory Visit: Payer: Medicare (Managed Care) | Admitting: Physical Therapy

## 2023-04-28 ENCOUNTER — Encounter: Payer: Self-pay | Admitting: Physical Therapy

## 2023-04-28 ENCOUNTER — Other Ambulatory Visit: Payer: Self-pay

## 2023-04-28 ENCOUNTER — Ambulatory Visit: Payer: Medicare (Managed Care) | Attending: Internal Medicine | Admitting: Physical Therapy

## 2023-04-28 DIAGNOSIS — R2681 Unsteadiness on feet: Secondary | ICD-10-CM | POA: Diagnosis not present

## 2023-04-28 DIAGNOSIS — M6281 Muscle weakness (generalized): Secondary | ICD-10-CM | POA: Insufficient documentation

## 2023-04-28 DIAGNOSIS — R293 Abnormal posture: Secondary | ICD-10-CM | POA: Diagnosis not present

## 2023-04-28 DIAGNOSIS — R262 Difficulty in walking, not elsewhere classified: Secondary | ICD-10-CM | POA: Insufficient documentation

## 2023-04-28 NOTE — Therapy (Unsigned)
OUTPATIENT PHYSICAL THERAPY LOWER EXTREMITY / BALANCE EVALUATION   Patient Name: Diane Nielsen MRN: [id number] DOB:[birth year], 68 y.o., female Today's Date: 04/28/2023  END OF SESSION:  PT End of Session - 04/28/23 1721     Visit Number 1    Date for PT Re-Evaluation 06/23/23    Authorization Type [employer]    Progress Note Due on Visit 10    PT Start Time 1408    PT Stop Time 1445    PT Time Calculation (min) 37 min    Activity Tolerance Patient tolerated treatment well    Behavior During Therapy [facility] for tasks assessed/performed             Past Medical History:  Diagnosis Date   Acute exacerbation of CHF (congestive heart failure) (HCC) 02/27/2017   Arthritis    Hypertension    Past Surgical History:  Procedure Laterality Date   CLIPPING OF ATRIAL APPENDAGE N/A 03/04/2017   Procedure: CLIPPING OF ATRIAL APPENDAGE;  Surgeon: Kerin Perna, MD;  Location: [facility] OR;  Service: Open Heart Surgery;  Laterality: N/A;   MAZE N/A 03/04/2017   Procedure: MAZE;  Surgeon: Kerin Perna, MD;  Location: [facility] OR;  Service: Open Heart Surgery;  Laterality: N/A;   MITRAL VALVE REPLACEMENT N/A 03/04/2017   Procedure: MITRAL VALVE (MV) REPLACEMENT, MAZE PROCEDURE;  Surgeon: Kerin Perna, MD;  Location: [facility] OR;  Service: Open Heart Surgery;  Laterality: N/A;   RIGHT/LEFT HEART CATH AND CORONARY ANGIOGRAPHY N/A 03/02/2017   Procedure: RIGHT/LEFT HEART CATH AND CORONARY ANGIOGRAPHY;  Surgeon: Rinaldo Cloud, MD;  Location: [facility];  Service: Cardiovascular;  Laterality: N/A;   Patient Active Problem List   Diagnosis Date Noted   Atrial flutter with rapid ventricular response (HCC) 04/01/2017   A-fib (HCC) 03/13/2017   S/P MVR (mitral valve replacement) 03/04/2017   Pulmonary hypertension (HCC) 03/03/2017   Non-rheumatic mitral regurgitation    Hypothyroidism    Elevated LFTs    Acute exacerbation of CHF (congestive heart failure) (HCC) 02/27/2017   Acute  pulmonary edema (HCC)    Shortness of breath    Tobacco abuse     PCP: Philip Aspen, Limmie Patricia, MD  REFERRING PROVIDER: Philip Aspen, Limmie Patricia, MD  REFERRING DIAG: [phone] (ICD-10-CM) - Unsteady gait when walking  THERAPY DIAG:  Difficulty in walking, not elsewhere classified  Muscle weakness (generalized)  Abnormal posture  Rationale for Evaluation and Treatment: Rehabilitation  ONSET DATE: three years  SUBJECTIVE:   SUBJECTIVE STATEMENT: Patient presents with balance impairments she noticed about three year ago. She has fallen on her left hip several times (two plus years ago). She fell from a a ladder, she slipped while mopping, and she stumbled while walking at home. She feels the most unsteady when she stands up from a chair and she feels she has to rock back and forth. She does not feel like she is a falls risk.  PERTINENT HISTORY: CHF; arthritis ; HTN PAIN:  Are you having pain? No  PRECAUTIONS: None  RED FLAGS: None   WEIGHT BEARING RESTRICTIONS: No  FALLS:  Has patient fallen in last 6 months? No  LIVING ENVIRONMENT: Lives with: lives alone Lives in: House/apartment Stairs: Yes: External: 1-2 steps; can reach both Has following equipment at home: Single point cane, Walker - 2 wheeled, and Grab bars  OCCUPATION: Retired; looking for work  PLOF: Independent with basic ADLs, Independent with household mobility without device, Independent with community mobility without device,  and Leisure: walking  PATIENT GOALS: To feel stronger when she stands up from a chair  NEXT MD VISIT: PRN  OBJECTIVE:  Note: Objective measures were completed at Evaluation unless otherwise noted.    PATIENT SURVEYS:  ABC scale 81.25%  COGNITION: Overall cognitive status: Within functional limits for tasks assessed     SENSATION: WFL   POSTURE: rounded shoulders and forward head   LOWER EXTREMITY ROM: WFL bilateral    LOWER EXTREMITY MMT:  MMT  Right eval Left eval  Hip flexion 4- 4-  Hip extension    Hip abduction 4- 4-  Hip adduction 4- 4-  Hip internal rotation    Hip external rotation    Knee flexion 4 4  Knee extension 4 4  Ankle dorsiflexion    Ankle plantarflexion    Ankle inversion    Ankle eversion     (Blank rows = not tested)    FUNCTIONAL TESTS:  5 times sit to stand: 16.14 only used UE support on first stand Timed up and go (TUG): 16.49 MCTSIB: Condition 1: Avg of 3 trials: 30 sec, Condition 2: Avg of 3 trials: 30 (increased sway) sec, Condition 3: Avg of 3 trials: 30 sec, Condition 4: Avg of 3 trials: 30 (increased sway) sec, and Total Score: 12/120    SL Balance : Rt :1-2 secs  Lt : 1-2 secs  Gait  Absent heel strike; guarded gait pattern; stiff leg/ decreased knee flexion throughout gait                                                                                                                              TREATMENT DATE:  04/28/2023 Initial Evaluation & HEP created     PATIENT EDUCATION:  Education details: HEP; three systems involved in balance; POC Person educated: Patient Education method: Explanation, Demonstration, and Handouts Education comprehension: verbalized understanding, returned demonstration, and needs further education  HOME EXERCISE PROGRAM: Access Code: [email or web address] URL: [email or web address] Date: 04/28/2023 Prepared by: Claude Manges  Exercises - Standing March with Counter Support  - 1 x daily - 7 x weekly - 2 sets - 10 reps - Standing Hip Abduction with Unilateral Counter Support  - 1 x daily - 7 x weekly - 2 sets - 10 reps - Heel Toe Raises with Unilateral Counter Support  - 1 x daily - 7 x weekly - 2 sets - 10 reps - Sit to Stand Without Arm Support  - 1 x daily - 7 x weekly - 1 sets - 10 reps  ASSESSMENT:  CLINICAL IMPRESSION: Patient is a 68 y.o. female who was seen today for physical therapy evaluation and treatment for unsteadiness. Diane Nielsen  presents today with complaints of unsteadiness when she stands up from a chair. She noticed this three years ago and she feels her legs are not strong. She has not had any falls within the last three month, but she has had multiple falls in the past.  Based on evaluation noted muscle weakness, abnormal gait, and poor single leg balance. Patient is motivated and she wants to get stronger in order to return to working. Educated patient on the three systems involved in balance and how we can manipulate them in physical therapy. Patient will benefit from skilled PT to address the below impairments and improve overall function.   OBJECTIVE IMPAIRMENTS: Abnormal gait, decreased balance, decreased ROM, decreased strength, impaired flexibility, and postural dysfunction.   ACTIVITY LIMITATIONS: squatting, stairs, transfers, and locomotion level  PARTICIPATION LIMITATIONS: cleaning, laundry, shopping, community activity, and occupation  PERSONAL FACTORS: Age, Fitness, and 1-2 comorbidities: HTN; CHF  are also affecting patient's functional outcome.   REHAB POTENTIAL: Good  CLINICAL DECISION MAKING: Stable/uncomplicated  EVALUATION COMPLEXITY: Low   GOALS: Goals reviewed with patient? Yes  SHORT TERM GOALS: Target date: 05/26/2023  Patient will be independent with initial HEP. Baseline:  Goal status: INITIAL  2.  Patient will report > or = to 30% improvement in balance confidence since starting PT. Baseline:  Goal status: INITIAL  3.  Patient will demonstrate control when completing sit to stand without UE support. Baseline:  Goal status: INITIAL    LONG TERM GOALS: Target date: 06/23/2023  Patient will demonstrate independence in advanced HEP. Baseline:  Goal status: INITIAL  2.  Patient will report > or = to 60% improvement in symptoms since starting PT. Baseline:  Goal status: INITIAL  3.  Patient will complete TUG in < or = to 12 sec for decreased risk of falls. Baseline:  Goal  status: INITIAL  4.  Patient will complete 5 STS in < or = to 12 secs for improved functional mobility. Baseline:  Goal status: INITIAL   PLAN:  PT FREQUENCY: 1-2x/week  PT DURATION: 8 weeks  PLANNED INTERVENTIONS: 97164- PT Re-evaluation, 97110-Therapeutic exercises, 97530- Therapeutic activity, [id number]- Neuromuscular re-education, 97535- Self Care, [id number]- Manual therapy, [phone]- Gait training, [phone]- Canalith repositioning, [id number]- Aquatic Therapy, [phone]- Electrical stimulation (unattended), [phone]- Electrical stimulation (manual), [id number]- Vasopneumatic device, [id number]- Ultrasound, [id number]- Traction (mechanical), [id number]- Ionotophoresis 4mg /ml Dexamethasone, Patient/Family education, Balance training, Stair training, Taping, Dry Needling, Joint mobilization, Joint manipulation, Spinal manipulation, Spinal mobilization, Vestibular training, Cryotherapy, and Moist heat  PLAN FOR NEXT SESSION: Review HEP; NuStep; DGI; LE strengthening   Claude Manges, PT 04/28/23 5:22 PM [facility] [address], [address] [place], [place] [address] Phone # [phone] Fax [phone]

## 2023-05-06 ENCOUNTER — Ambulatory Visit: Payer: Medicare (Managed Care)

## 2023-05-11 ENCOUNTER — Other Ambulatory Visit: Payer: Self-pay | Admitting: Internal Medicine

## 2023-05-11 DIAGNOSIS — E039 Hypothyroidism, unspecified: Secondary | ICD-10-CM

## 2023-05-13 ENCOUNTER — Encounter: Payer: Medicare Other | Admitting: Physical Therapy

## 2023-06-12 DIAGNOSIS — F411 Generalized anxiety disorder: Secondary | ICD-10-CM | POA: Diagnosis not present

## 2023-06-12 DIAGNOSIS — F251 Schizoaffective disorder, depressive type: Secondary | ICD-10-CM | POA: Diagnosis not present

## 2023-06-17 ENCOUNTER — Encounter: Payer: Medicare Other | Admitting: Physical Therapy

## 2023-06-25 ENCOUNTER — Other Ambulatory Visit: Payer: Self-pay | Admitting: Internal Medicine

## 2023-06-25 DIAGNOSIS — I4891 Unspecified atrial fibrillation: Secondary | ICD-10-CM | POA: Diagnosis not present

## 2023-06-25 DIAGNOSIS — Z1231 Encounter for screening mammogram for malignant neoplasm of breast: Secondary | ICD-10-CM

## 2023-07-10 DIAGNOSIS — H5213 Myopia, bilateral: Secondary | ICD-10-CM | POA: Diagnosis not present

## 2023-07-20 ENCOUNTER — Encounter (HOSPITAL_COMMUNITY): Payer: Self-pay

## 2023-07-20 ENCOUNTER — Ambulatory Visit: Payer: Medicare (Managed Care)

## 2023-07-27 ENCOUNTER — Ambulatory Visit
Admission: RE | Admit: 2023-07-27 | Discharge: 2023-07-27 | Disposition: A | Discharge status: Home / Self Care | Payer: Medicare (Managed Care) | Source: Ambulatory Visit | Attending: Internal Medicine | Admitting: Internal Medicine

## 2023-07-27 DIAGNOSIS — Z1231 Encounter for screening mammogram for malignant neoplasm of breast: Secondary | ICD-10-CM | POA: Diagnosis not present

## 2023-07-30 DIAGNOSIS — I4891 Unspecified atrial fibrillation: Secondary | ICD-10-CM | POA: Diagnosis not present

## 2023-08-05 ENCOUNTER — Encounter: Admitting: Adult Health

## 2023-08-07 DIAGNOSIS — F411 Generalized anxiety disorder: Secondary | ICD-10-CM | POA: Diagnosis not present

## 2023-08-07 DIAGNOSIS — F251 Schizoaffective disorder, depressive type: Secondary | ICD-10-CM | POA: Diagnosis not present

## 2023-09-09 DIAGNOSIS — I4891 Unspecified atrial fibrillation: Secondary | ICD-10-CM | POA: Diagnosis not present

## 2023-09-16 DIAGNOSIS — I4891 Unspecified atrial fibrillation: Secondary | ICD-10-CM | POA: Diagnosis not present

## 2023-09-25 ENCOUNTER — Encounter (HOSPITAL_COMMUNITY): Payer: Self-pay | Admitting: *Deleted

## 2023-09-25 ENCOUNTER — Other Ambulatory Visit: Payer: Self-pay

## 2023-09-25 ENCOUNTER — Emergency Department (HOSPITAL_COMMUNITY)

## 2023-09-25 ENCOUNTER — Emergency Department (HOSPITAL_COMMUNITY)
Admission: EM | Admit: 2023-09-25 | Discharge: 2023-09-26 | Disposition: A | Discharge status: Home / Self Care | Attending: Emergency Medicine | Admitting: Emergency Medicine

## 2023-09-25 DIAGNOSIS — I10 Essential (primary) hypertension: Secondary | ICD-10-CM | POA: Diagnosis not present

## 2023-09-25 DIAGNOSIS — Z8673 Personal history of transient ischemic attack (TIA), and cerebral infarction without residual deficits: Secondary | ICD-10-CM | POA: Diagnosis not present

## 2023-09-25 DIAGNOSIS — G510 Bell's palsy: Secondary | ICD-10-CM

## 2023-09-25 DIAGNOSIS — I700 Atherosclerosis of aorta: Secondary | ICD-10-CM | POA: Diagnosis not present

## 2023-09-25 DIAGNOSIS — I509 Heart failure, unspecified: Secondary | ICD-10-CM | POA: Diagnosis not present

## 2023-09-25 DIAGNOSIS — R4701 Aphasia: Secondary | ICD-10-CM | POA: Insufficient documentation

## 2023-09-25 DIAGNOSIS — R9431 Abnormal electrocardiogram [ECG] [EKG]: Secondary | ICD-10-CM | POA: Diagnosis not present

## 2023-09-25 DIAGNOSIS — F17200 Nicotine dependence, unspecified, uncomplicated: Secondary | ICD-10-CM | POA: Diagnosis not present

## 2023-09-25 DIAGNOSIS — I6523 Occlusion and stenosis of bilateral carotid arteries: Secondary | ICD-10-CM | POA: Diagnosis not present

## 2023-09-25 DIAGNOSIS — K0889 Other specified disorders of teeth and supporting structures: Secondary | ICD-10-CM | POA: Diagnosis not present

## 2023-09-25 DIAGNOSIS — G459 Transient cerebral ischemic attack, unspecified: Secondary | ICD-10-CM | POA: Diagnosis not present

## 2023-09-25 DIAGNOSIS — Z79899 Other long term (current) drug therapy: Secondary | ICD-10-CM | POA: Insufficient documentation

## 2023-09-25 DIAGNOSIS — Z7901 Long term (current) use of anticoagulants: Secondary | ICD-10-CM | POA: Diagnosis not present

## 2023-09-25 LAB — RAPID URINE DRUG SCREEN, HOSP PERFORMED
Amphetamines: NOT DETECTED
Barbiturates: NOT DETECTED
Benzodiazepines: NOT DETECTED
Cocaine: NOT DETECTED
Opiates: NOT DETECTED
Tetrahydrocannabinol: NOT DETECTED

## 2023-09-25 LAB — DIFFERENTIAL
Abs Immature Granulocytes: 0.02 K/uL (ref 0.00–0.07)
Basophils Absolute: 0 K/uL (ref 0.0–0.1)
Basophils Relative: 1 %
Eosinophils Absolute: 0 K/uL (ref 0.0–0.5)
Eosinophils Relative: 0 %
Immature Granulocytes: 0 %
Lymphocytes Relative: 16 %
Lymphs Abs: 1.2 K/uL (ref 0.7–4.0)
Monocytes Absolute: 0.4 K/uL (ref 0.1–1.0)
Monocytes Relative: 6 %
Neutro Abs: 5.9 K/uL (ref 1.7–7.7)
Neutrophils Relative %: 77 %

## 2023-09-25 LAB — URINALYSIS, ROUTINE W REFLEX MICROSCOPIC
Glucose, UA: NEGATIVE mg/dL
Hgb urine dipstick: NEGATIVE
Ketones, ur: 5 mg/dL — AB
Leukocytes,Ua: NEGATIVE
Nitrite: NEGATIVE
Protein, ur: 100 mg/dL — AB
Specific Gravity, Urine: 1.029 (ref 1.005–1.030)
pH: 5 (ref 5.0–8.0)

## 2023-09-25 LAB — CBC
HCT: 44.7 % (ref 36.0–46.0)
Hemoglobin: 14.5 g/dL (ref 12.0–15.0)
MCH: 29.5 pg (ref 26.0–34.0)
MCHC: 32.4 g/dL (ref 30.0–36.0)
MCV: 90.9 fL (ref 80.0–100.0)
Platelets: 124 K/uL — ABNORMAL LOW (ref 150–400)
RBC: 4.92 MIL/uL (ref 3.87–5.11)
RDW: 16.3 % — ABNORMAL HIGH (ref 11.5–15.5)
WBC: 7.6 K/uL (ref 4.0–10.5)
nRBC: 0 % (ref 0.0–0.2)

## 2023-09-25 LAB — ETHANOL: Alcohol, Ethyl (B): <15 mg/dL (ref <15)

## 2023-09-25 LAB — I-STAT CHEM 8, ED
BUN: 36 mg/dL — ABNORMAL HIGH (ref 8–23)
BUN: 25 mg/dL — ABNORMAL HIGH (ref 8–23)
Calcium, Ion: 1.01 mmol/L — ABNORMAL LOW (ref 1.15–1.40)
Calcium, Ion: 1.1 mmol/L — ABNORMAL LOW (ref 1.15–1.40)
Chloride: 108 mmol/L (ref 98–111)
Chloride: 104 mmol/L (ref 98–111)
Creatinine, Ser: 1.2 mg/dL — ABNORMAL HIGH (ref 0.44–1.00)
Creatinine, Ser: 1.3 mg/dL — ABNORMAL HIGH (ref 0.44–1.00)
Glucose, Bld: 110 mg/dL — ABNORMAL HIGH (ref 70–99)
Glucose, Bld: 94 mg/dL (ref 70–99)
HCT: 46 % (ref 36.0–46.0)
HCT: 40 % (ref 36.0–46.0)
Hemoglobin: 15.6 g/dL — ABNORMAL HIGH (ref 12.0–15.0)
Hemoglobin: 13.6 g/dL (ref 12.0–15.0)
Potassium: 7.3 mmol/L (ref 3.5–5.1)
Potassium: 4.5 mmol/L (ref 3.5–5.1)
Sodium: 135 mmol/L (ref 135–145)
Sodium: 137 mmol/L (ref 135–145)
TCO2: 21 mmol/L — ABNORMAL LOW (ref 22–32)
TCO2: 24 mmol/L (ref 22–32)

## 2023-09-25 LAB — PROTIME-INR
INR: 3 — ABNORMAL HIGH (ref 0.8–1.2)
Prothrombin Time: 32.6 s — ABNORMAL HIGH (ref 11.4–15.2)

## 2023-09-25 LAB — APTT: aPTT: 47 s — ABNORMAL HIGH (ref 24–36)

## 2023-09-25 LAB — CBG MONITORING, ED: Glucose-Capillary: 115 mg/dL — ABNORMAL HIGH (ref 70–99)

## 2023-09-25 MED ORDER — IOHEXOL 350 MG/ML SOLN
75.0000 mL | Freq: Once | INTRAVENOUS | Status: AC | PRN
  Administered 2023-09-25: 75 mL via INTRAVENOUS

## 2023-09-25 NOTE — ED Triage Notes (Signed)
 The pt reports that she is visiting in town but her friend reports that her speech was slurred  about an hour ago

## 2023-09-25 NOTE — ED Provider Triage Note (Addendum)
 Emergency Medicine Provider Triage Evaluation Note  Diane Nielsen , a 68 y.o. female  was evaluated in triage.  Pt complains of AMS.  Review of Systems  Positive:  Negative:   Physical Exam  BP 127/65 (BP Location: Right Arm)   Pulse 93   Temp 97.9 F (36.6 C) (Oral)   Resp 16   Ht 5' 5 (1.651 m)   Wt 72.9 kg   SpO2 95%   BMI 26.74 kg/m  Gen:   Awake, no distress   Resp:  Normal effort  MSK:   Moves extremities without difficulty  Other:    Medical Decision Making  Medically screening exam initiated at 8:57 PM.  Appropriate orders placed.  Diane Nielsen was informed that the remainder of the evaluation will be completed by another provider, this initial triage assessment does not replace that evaluation, and the importance of remaining in the ED until their evaluation is complete.  Patient with left sided facial droop. Friend at bedside stating that patient was lost from 2PM[place]. LKN 2PM. Patient is on coumadin . Patient denies any recent infectious symptoms. Patient not currently confused and has a mild right sided facial droop. Denies headache, vision changes, double vision.   GCS 15. Speech is goal oriented. No deficits appreciated to CN III-XII; symmetric eyebrow raise, tongue midline. Patient has equal grip strength bilaterally with 5/5 strength against resistance in all major muscle groups bilaterally. Sensation to light touch intact.  Normal finger-nose-finger. Patient ambulatory with steady gait. AAOx3. Able to recall names of simple items.       Diane Nielsen, [place] 09/25/23 2100

## 2023-09-25 NOTE — ED Triage Notes (Signed)
The pt takes coumadin

## 2023-09-25 NOTE — ED Provider Notes (Signed)
 [place] [facility] Provider Note   CSN: [id number] Arrival date & time: 09/25/23  2032     Patient presents with: Aphasia   Diane Nielsen is a 68 y.o. 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  Last known well was around 1400 today.  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  After that from 1400-1900 she was driving around in the car.  Patient reports that she does member initially drive around the car however was not aware how [facility].  [facility], she did call her friend Tammy who contributes to history.  Family reports that she was called as the patient was lost to did not know where she was at [facility].  The CPS workers called a Emergency planning/management officer who sat with the patient.  Tammy reports that she noticed that the patient had a left-sided facial droop and was concerned.  Had not seen the patient over weeks and unsure last known well for that.  Reports that when she picked her up she noticed that her speech was slightly slurred and that she had a hard time coming up with sentences and getting her words out and so brought her into the emergency department for evaluation of this.  Patient denies any history of CVAs.  She is on warfarin for her MVR and reports compliance with this.  She has no complaints and reports that she feels at her baseline.  HPI     Prior to Admission medications   Medication Sig Start Date End Date Taking? Authorizing Provider  albuterol  (VENTOLIN  HFA) 108 (90 Base) MCG/ACT inhaler TAKE 2 PUFFS BY MOUTH EVERY 6 HOURS AS NEEDED FOR WHEEZE OR SHORTNESS OF BREATH 03/17/23   Theophilus Andrews, Tully GRADE, MD  amiodarone  (PACERONE ) 200 MG tablet Take 1 tablet (200 mg total) by mouth 2 (two) times daily. Patient taking differently: Take 100 mg by mouth daily. 1/2 tablet daily 04/02/17   Levern Hutching, MD   ARIPiprazole (ABILIFY) 5 MG tablet Take 5 mg by mouth at bedtime.    [provider]  buPROPion  (WELLBUTRIN  XL) 150 MG 24 hr tablet TAKE 1 TABLET BY MOUTH EVERY DAY 02/16/23   Theophilus Andrews, Tully GRADE, MD  docusate sodium  (COLACE) 250 MG capsule Take 250 mg by mouth daily.    [provider]  gabapentin  (NEURONTIN ) 300 MG capsule Take 1 capsule (300 mg total) by mouth 3 (three) times daily for 7 days. 01/10/22 12/24/22  Silva Juliene SAUNDERS, DPM  levothyroxine  (SYNTHROID ) 125 MCG tablet TAKE 1 TABLET BY MOUTH EVERY DAY BEFORE BREAKFAST 05/11/23   Theophilus Andrews, Tully GRADE, MD  losartan  (COZAAR ) 25 MG tablet Take 25 mg by mouth daily. 01/22/23   [provider]  Oxymetazoline HCl (NASAL SPRAY) 0.05 % SOLN Place into the nose.    [provider]  rosuvastatin (CRESTOR) 10 MG tablet Take 10 mg by mouth at bedtime. 09/25/21   [provider]  Sertraline HCl 150 MG CAPS Take 1 capsule by mouth daily. 08/27/21   [provider]  traZODone (DESYREL) 50 MG tablet Take 150 mg by mouth at bedtime.    [provider]  warfarin (COUMADIN ) 4 MG tablet Take 1 tablet (4 mg total) by mouth daily at 6 PM. 04/03/17   Levern Hutching, MD    Allergies: Patient  has no known allergies.    Review of Systems  Constitutional:  Negative for chills and fever.  Respiratory:  Negative for cough and shortness of breath.   Cardiovascular:  Negative for chest pain.  Gastrointestinal:  Negative for abdominal pain, constipation, diarrhea, nausea and vomiting.  Genitourinary:  Negative for dysuria and hematuria.  Musculoskeletal:  Negative for neck pain and neck stiffness.  Neurological:  Positive for facial asymmetry and speech difficulty. Negative for dizziness, light-headedness and headaches.  Psychiatric/Behavioral:  Positive for confusion.     Updated Vital Signs BP 118/77   Pulse 77   Temp 97.9 F (36.6 C) (Oral)   Resp 16   Ht 5' 5 (1.651 m)   Wt 72.9 kg    SpO2 96%   BMI 26.74 kg/m   Physical Exam Vitals and nursing note reviewed.  Constitutional:      General: She is not in acute distress.    Appearance: She is not ill-appearing or toxic-appearing.  HENT:     Mouth/Throat:     Mouth: Mucous membranes are moist.     Comments: Poor dentition Eyes:     General: No scleral icterus.    Extraocular Movements: Extraocular movements intact.     Pupils: Pupils are equal, round, and reactive to light.  Cardiovascular:     Rate and Rhythm: Normal rate.     Pulses: Normal pulses.  Pulmonary:     Effort: Pulmonary effort is normal. No respiratory distress.  Abdominal:     Palpations: Abdomen is soft.     Tenderness: There is no abdominal tenderness. There is no guarding or rebound.  Musculoskeletal:     Cervical back: Normal range of motion. No rigidity.  Skin:    General: Skin is warm and dry.  Neurological:     Mental Status: She is alert and oriented to person, place, and time.     Cranial Nerves: Facial asymmetry present. No cranial nerve deficit.     Sensory: No sensory deficit.     Motor: No weakness or pronator drift.     Coordination: Finger-Nose-Finger Test normal.     Comments: The patient has a slight right facial droop and some into the cheek. Spares forehead. Otherwise intact. No pronator drift.  Answering questions appropriately appropriate speech.  Does have some confusion with following simple commands like to do the finger-nose-finger however she did do this correctly.  She is alert and oriented x 4.  Strength is 5 out of 5 in the patient's upper and lower bilateral extremities.     (all labs ordered are listed, but only abnormal results are displayed) Labs Reviewed  CBC - Abnormal; Notable for the following components:      Result Value   RDW 16.3 (*)    Platelets 124 (*)    All other components within normal limits  URINALYSIS, ROUTINE W REFLEX MICROSCOPIC - Abnormal; Notable for the following components:    Color, Urine AMBER (*)    APPearance HAZY (*)    Bilirubin Urine SMALL (*)    Ketones, ur 5 (*)    Protein, ur 100 (*)    Bacteria, UA RARE (*)    All other components within normal limits  COMPREHENSIVE METABOLIC PANEL WITH GFR - Abnormal; Notable for the following components:   Creatinine, Ser 1.27 (*)    Calcium  8.3 (*)    Albumin  3.4 (*)    GFR, Estimated 47 (*)    All other components within normal limits  PROTIME-INR -  Abnormal; Notable for the following components:   Prothrombin Time 32.6 (*)    INR 3.0 (*)    All other components within normal limits  APTT - Abnormal; Notable for the following components:   aPTT 47 (*)    All other components within normal limits  CBG MONITORING, ED - Abnormal; Notable for the following components:   Glucose-Capillary 115 (*)    All other components within normal limits  I-STAT CHEM 8, ED - Abnormal; Notable for the following components:   Potassium 7.3 (*)    BUN 36 (*)    Creatinine, Ser 1.20 (*)    Glucose, Bld 110 (*)    Calcium , Ion 1.01 (*)    TCO2 21 (*)    Hemoglobin 15.6 (*)    All other components within normal limits  I-STAT CHEM 8, ED - Abnormal; Notable for the following components:   BUN 25 (*)    Creatinine, Ser 1.30 (*)    Calcium , Ion 1.10 (*)    All other components within normal limits  DIFFERENTIAL  ETHANOL  RAPID URINE DRUG SCREEN, HOSP PERFORMED  COMPREHENSIVE METABOLIC PANEL WITH GFR  PROTIME-INR  TSH    EKG: EKG Interpretation Date/Time:  Friday September 25 2023 21:58:03 EDT Ventricular Rate:  78 PR Interval:  141 QRS Duration:  82 QT Interval:  422 QTC Calculation: 481 R Axis:   52  Text Interpretation: Sinus rhythm Atrial premature complex Minimal ST depression, lateral leads No significant change since last tracing Confirmed by Emil Share [phone]) on 09/25/2023 10:41:33 PM  Radiology: CT ANGIO HEAD NECK W WO CM Result Date: 09/25/2023 EXAM: CTA HEAD AND NECK WITH AND WITHOUT 09/25/2023 10:35:58  PM TECHNIQUE: CTA of the head and neck was performed with and without the administration of intravenous contrast. Multiplanar 2D and/or 3D reformatted images are provided for review. Automated exposure control, iterative reconstruction, and/or weight based adjustment of the mA/kV was utilized to reduce the radiation dose to as low as reasonably achievable. Stenosis of the internal carotid arteries measured using NASCET criteria. COMPARISON: None available CLINICAL HISTORY: Stroke/TIA, determine embolic source. Chief complaints; Aphasia. FINDINGS: CTA NECK: AORTIC ARCH AND ARCH VESSELS: Calcific aortic atherosclerosis. CERVICAL CAROTID ARTERIES: Right carotid bifurcation atherosclerosis without hemodynamically significant stenosis. Mild atherosclerosis at the left carotid bifurcation without hemodynamically significant stenosis. CERVICAL VERTEBRAL ARTERIES: No dissection, arterial injury, or significant stenosis. LUNGS AND MEDIASTINUM: Unremarkable. SOFT TISSUES: No acute abnormality. BONES: No acute abnormality. CTA HEAD: ANTERIOR CIRCULATION: No significant stenosis of the internal carotid arteries. No significant stenosis of the anterior cerebral arteries. No significant stenosis of the middle cerebral arteries. No aneurysm. POSTERIOR CIRCULATION: Old left cerebellar infarct. No significant stenosis of the posterior cerebral arteries. No significant stenosis of the basilar artery. No significant stenosis of the vertebral arteries. No aneurysm. OTHER: No dural venous sinus thrombosis on this non-dedicated study. Chronic ischemic white matter changes. IMPRESSION: 1. No large vessel occlusion, hemodynamically significant stenosis, or aneurysm in the head or neck. 2. Bilateral carotid bifurcation atherosclerosis without hemodynamically significant stenosis. Electronically signed by: Franky Stanford MD 09/25/2023 11:00 PM EDT RP Workstation: [id number]    Procedures   Medications Ordered in the ED  iohexol   (OMNIPAQUE ) 350 MG/ML injection 75 mL (75 mLs Intravenous Contrast Given 09/25/23 2236)       Medical Decision Making Amount and/or Complexity of Data Reviewed Labs: ordered. Radiology: ordered.  Risk Prescription drug management.   68 y.o. female presents to the ER for evaluation of AMS/slurred speech. Differential  diagnosis includes but is not limited to Drug-related, hypoxia, hyper/hypoglycemia, encephalopathy, sepsis, DKA/HHS, brain lesion, CVA, seizure, environmental, psychiatric. Vital signs vital signs a respiratory rate 22 however patient does not tachypneic speaking full sentences without any acute distress.  Vital signs unremarkable otherwise. Physical exam as noted above.   Patient does have slight left-sided facial droop returned with the mouth.  Last known well around 1400, of out of any stroke window.  She has no complaints.  Unsure when the left-sided facial droop as last time she was seen was over a week ago by friends.  Patient does not feel that she was having any slurred speech however friend reports whenever she was talking with her she noticed it.  Given last known well per patient of being 1400, out of stroke window.  I do not appreciate any focal weakness.  I independently reviewed and interpreted the patient's labs.  Ethanol undetectable.  UDS unremarkable.  Urinalysis shows amber, hazy urine with small and bilirubin present, 5 ketones, 100 protein.  Rare bacteria present with 21-50 red blood cells.  PT/INR at 32.6 and 3.0 respectively.  APTT at 47.  I-STAT Chem-8 shows potassium 7.3, BUN of 36 with a creatinine 1.2.  CMP shows creatinine of 1.27, calcium  8.3, albumin  of 3.4, no other electrolyte or LFT abnormality.  Had an at baseline.  CBC was at 124.  No leukocytosis or anemia.  CTA of the head and neck shows 1. No large vessel occlusion, hemodynamically significant stenosis, or aneurysm in the head or neck. 2. Bilateral carotid bifurcation atherosclerosis without  hemodynamically significant stenosis. Per radiologist's interpretation.    I spoke with Dr. Alphonsa from neurology about the patient and recommends MRI without contrast of the brain. If unremarkable, clear from neurology stance. Recommends adding TSH.  12:50 AM Care of Diane Nielsen transferred to PA Leita Chancy at the end of my shift as the patient will require reassessment once labs/imaging have resulted. Patient presentation, ED course, and plan of care discussed with review of all pertinent labs and imaging. Please see his/her note for further details regarding further ED course and disposition. Plan at time of handoff is follow with MRI. This may be altered or completely changed at the discretion of the oncoming team pending results of further workup.  I discussed this case with my attending physician who cosigned this note including patient's presenting symptoms, physical exam, and planned diagnostics and interventions. Attending physician stated agreement with plan or made changes to plan which were implemented.   Portions of this report may have been transcribed using voice recognition software. Every effort was made to ensure accuracy; however, inadvertent computerized transcription errors may be present.    Final diagnoses:  None    ED Discharge Orders     None          Bernis Ernst, PA-C 09/26/23 0104    Emil Share, DO 09/28/23 [phone]

## 2023-09-26 ENCOUNTER — Emergency Department (HOSPITAL_COMMUNITY)

## 2023-09-26 DIAGNOSIS — I6523 Occlusion and stenosis of bilateral carotid arteries: Secondary | ICD-10-CM | POA: Diagnosis not present

## 2023-09-26 DIAGNOSIS — G459 Transient cerebral ischemic attack, unspecified: Secondary | ICD-10-CM | POA: Diagnosis not present

## 2023-09-26 DIAGNOSIS — Z8673 Personal history of transient ischemic attack (TIA), and cerebral infarction without residual deficits: Secondary | ICD-10-CM | POA: Diagnosis not present

## 2023-09-26 DIAGNOSIS — I700 Atherosclerosis of aorta: Secondary | ICD-10-CM | POA: Diagnosis not present

## 2023-09-26 LAB — COMPREHENSIVE METABOLIC PANEL WITH GFR
ALT: 13 U/L (ref 0–44)
AST: 21 U/L (ref 15–41)
Albumin: 3.4 g/dL — ABNORMAL LOW (ref 3.5–5.0)
Alkaline Phosphatase: 56 U/L (ref 38–126)
Anion gap: 7 (ref 5–15)
BUN: 21 mg/dL (ref 8–23)
CO2: 23 mmol/L (ref 22–32)
Calcium: 8.3 mg/dL — ABNORMAL LOW (ref 8.9–10.3)
Chloride: 106 mmol/L (ref 98–111)
Creatinine, Ser: 1.27 mg/dL — ABNORMAL HIGH (ref 0.44–1.00)
GFR, Estimated: 47 mL/min — ABNORMAL LOW
Glucose, Bld: 98 mg/dL (ref 70–99)
Potassium: 4.4 mmol/L (ref 3.5–5.1)
Sodium: 136 mmol/L (ref 135–145)
Total Bilirubin: 0.9 mg/dL (ref 0.0–1.2)
Total Protein: 6.6 g/dL (ref 6.5–8.1)

## 2023-09-26 LAB — TSH: TSH: 1.452 u[IU]/mL (ref 0.350–4.500)

## 2023-09-26 NOTE — ED Provider Notes (Signed)
 68 yo female with aphasia, LKW around 1400hrs Friday (09/25/23).  Consult to neuro who recommends MRI. Care assumed pending MRI, TSH.  Currently with left side facial weakness, spares forehead.  IF MRI brain normal, can dc.  Physical Exam  BP (!) 141/76   Pulse 65   Temp (!) 97.5 F (36.4 C) (Oral)   Resp (!) 24   Ht 5' 5 (1.651 m)   Wt 72.9 kg   SpO2 98%   BMI 26.74 kg/m   Physical Exam  Procedures  Procedures  ED Course / MDM    Medical Decision Making Amount and/or Complexity of Data Reviewed Labs: ordered. Radiology: ordered.  Risk Prescription drug management.   MRI is negative for acute stroke. Question TIA versus Bell's palsy. Discussed results with patient.  Recommend recheck with PCP, follow-up with neurology, return to ER for worsening/concerning symptoms.        Beverley Leita LABOR, PA-C 09/26/23 0301    Bari Charmaine FALCON, MD 09/26/23 [phone]

## 2023-09-26 NOTE — Discharge Instructions (Signed)
 Follow-up with your primary care provider.   Provided with referral to neurology, please call on Monday to schedule appointment. Return to the emergency room for any worsening or concerning symptoms.

## 2023-09-26 NOTE — ED Notes (Signed)
 Patient back from MRI.

## 2023-09-26 NOTE — ED Notes (Signed)
 Patient transported to MRI

## 2023-09-29 ENCOUNTER — Encounter: Payer: Self-pay | Admitting: Internal Medicine

## 2023-09-29 ENCOUNTER — Ambulatory Visit (INDEPENDENT_AMBULATORY_CARE_PROVIDER_SITE_OTHER): Admitting: Internal Medicine

## 2023-09-29 VITALS — BP 120/84 | HR 85 | Temp 97.6°F | Wt 158.6 lb

## 2023-09-29 DIAGNOSIS — Z09 Encounter for follow-up examination after completed treatment for conditions other than malignant neoplasm: Secondary | ICD-10-CM | POA: Diagnosis not present

## 2023-09-29 DIAGNOSIS — I480 Paroxysmal atrial fibrillation: Secondary | ICD-10-CM

## 2023-09-29 DIAGNOSIS — G459 Transient cerebral ischemic attack, unspecified: Secondary | ICD-10-CM | POA: Diagnosis not present

## 2023-09-29 DIAGNOSIS — Z952 Presence of prosthetic heart valve: Secondary | ICD-10-CM | POA: Diagnosis not present

## 2023-09-29 NOTE — Progress Notes (Signed)
 Established Patient Office Visit     CC/Reason for Visit: ED follow-up  HPI: Diane Nielsen is a 68 y.o. female who is coming in today for the above mentioned reasons. Past Medical History is significant for: Hypothyroidism, atrial fibrillation, status post mitral valve replacement on Coumadin , pulmonary hypertension.  She was seen in the emergency department on July 18 after being found confused and with a potential left facial droop at a local [facility].  CT and MRI of the brain did not show acute abnormality, ED workup was negative and after consulting neurology she was discharged home.  She feels back to baseline.   Past Medical/Surgical History: Past Medical History:  Diagnosis Date   Acute exacerbation of CHF (congestive heart failure) (HCC) 02/27/2017   Arthritis    Hypertension     Past Surgical History:  Procedure Laterality Date   CLIPPING OF ATRIAL APPENDAGE N/A 03/04/2017   Procedure: CLIPPING OF ATRIAL APPENDAGE;  Surgeon: Fleeta Hanford Coy, MD;  Location: [facility] OR;  Service: Open Heart Surgery;  Laterality: N/A;   MAZE N/A 03/04/2017   Procedure: MAZE;  Surgeon: Fleeta Hanford Coy, MD;  Location: [facility] OR;  Service: Open Heart Surgery;  Laterality: N/A;   MITRAL VALVE REPLACEMENT N/A 03/04/2017   Procedure: MITRAL VALVE (MV) REPLACEMENT, MAZE PROCEDURE;  Surgeon: Fleeta Hanford Coy, MD;  Location: [facility] OR;  Service: Open Heart Surgery;  Laterality: N/A;   RIGHT/LEFT HEART CATH AND CORONARY ANGIOGRAPHY N/A 03/02/2017   Procedure: RIGHT/LEFT HEART CATH AND CORONARY ANGIOGRAPHY;  Surgeon: Levern Hutching, MD;  Location: [facility] INVASIVE [facility] LAB;  Service: Cardiovascular;  Laterality: N/A;    Social History:  reports that she has been smoking cigarettes. She started smoking about 37 years ago. She has a 15 pack-year smoking history. She has quit using smokeless tobacco. She reports that she does not currently use alcohol. She reports that she does not use drugs.  Allergies: No  Known Allergies  Family History:  Family History  Problem Relation Age of Onset   Lung cancer Mother    Lung cancer Sister    Breast cancer Neg Hx      Current Outpatient Medications:    albuterol  (VENTOLIN  HFA) 108 (90 Base) MCG/ACT inhaler, TAKE 2 PUFFS BY MOUTH EVERY 6 HOURS AS NEEDED FOR WHEEZE OR SHORTNESS OF BREATH, Disp: 18 each, Rfl: 0   amiodarone  (PACERONE ) 200 MG tablet, Take 1 tablet (200 mg total) by mouth 2 (two) times daily. (Patient taking differently: Take 100 mg by mouth daily. 1/2 tablet daily), Disp: 60 tablet, Rfl: 3   levothyroxine  (SYNTHROID ) 125 MCG tablet, TAKE 1 TABLET BY MOUTH EVERY DAY BEFORE BREAKFAST, Disp: 90 tablet, Rfl: 0   losartan  (COZAAR ) 25 MG tablet, Take 25 mg by mouth daily., Disp: , Rfl:    risperiDONE (RISPERDAL) 0.5 MG tablet, Take 0.5 mg by mouth at bedtime., Disp: , Rfl:    rosuvastatin (CRESTOR) 10 MG tablet, Take 10 mg by mouth at bedtime., Disp: , Rfl:    sertraline (ZOLOFT) 100 MG tablet, Take 150 mg by mouth daily., Disp: , Rfl:    traZODone (DESYREL) 150 MG tablet, Take 150 mg by mouth at bedtime., Disp: , Rfl:    warfarin (COUMADIN ) 4 MG tablet, Take 1 tablet (4 mg total) by mouth daily at 6 PM. (Patient taking differently: Take 4 mg by mouth See admin instructions. Take 1 tablet by mouth daily at 6pm, except Sunday), Disp: 35 tablet, Rfl: 3  Review of  Systems:  Negative unless indicated in HPI.   Physical Exam: Vitals:   09/29/23 0756  BP: 120/84  Pulse: 85  Temp: 97.6 F (36.4 C)  TempSrc: Oral  SpO2: 96%  Weight: 158 lb 9.6 oz (71.9 kg)    Body mass index is 26.39 kg/m.   Physical Exam Vitals reviewed.  Constitutional:      Appearance: Normal appearance.  HENT:     Head: Normocephalic and atraumatic.  Eyes:     Conjunctiva/sclera: Conjunctivae normal.  Cardiovascular:     Rate and Rhythm: Normal rate and regular rhythm.  Pulmonary:     Effort: Pulmonary effort is normal.     Breath sounds: Normal breath  sounds.  Skin:    General: Skin is warm and dry.  Neurological:     General: No focal deficit present.     Mental Status: She is alert and oriented to person, place, and time.  Psychiatric:        Mood and Affect: Mood normal.        Behavior: Behavior normal.        Thought Content: Thought content normal.        Judgment: Judgment normal.      Impression and Plan:  Hospital discharge follow-up  Paroxysmal atrial fibrillation (HCC)  S/P MVR (mitral valve replacement)  TIA (transient ischemic attack) -     ECHOCARDIOGRAM COMPLETE; Future -     VAS US  CAROTID; Future -     Lipid panel; Future -     TSH; Future   - Suspect this to be a TIA.  Will order an echocardiogram and carotid Dopplers, check lipids and TSH.  Time spent:32 minutes reviewing chart, interviewing and examining patient and formulating plan of care.     [name], MD [facility]

## 2023-10-13 ENCOUNTER — Other Ambulatory Visit: Payer: Self-pay | Admitting: Internal Medicine

## 2023-10-13 DIAGNOSIS — E039 Hypothyroidism, unspecified: Secondary | ICD-10-CM

## 2023-10-28 ENCOUNTER — Encounter (HOSPITAL_BASED_OUTPATIENT_CLINIC_OR_DEPARTMENT_OTHER): Payer: Self-pay

## 2023-10-28 DIAGNOSIS — I340 Nonrheumatic mitral (valve) insufficiency: Secondary | ICD-10-CM | POA: Diagnosis not present

## 2023-10-28 DIAGNOSIS — I10 Essential (primary) hypertension: Secondary | ICD-10-CM | POA: Diagnosis not present

## 2023-10-28 DIAGNOSIS — Z8679 Personal history of other diseases of the circulatory system: Secondary | ICD-10-CM | POA: Diagnosis not present

## 2023-10-28 DIAGNOSIS — E782 Mixed hyperlipidemia: Secondary | ICD-10-CM | POA: Diagnosis not present

## 2023-10-29 ENCOUNTER — Ambulatory Visit (INDEPENDENT_AMBULATORY_CARE_PROVIDER_SITE_OTHER)

## 2023-10-29 ENCOUNTER — Ambulatory Visit (HOSPITAL_BASED_OUTPATIENT_CLINIC_OR_DEPARTMENT_OTHER)

## 2023-10-29 DIAGNOSIS — G459 Transient cerebral ischemic attack, unspecified: Secondary | ICD-10-CM

## 2023-10-29 LAB — ECHOCARDIOGRAM COMPLETE
AV Vena cont: 0.55 cm
Area-P 1/2: 3.21 cm2
P 1/2 time: 438 ms
S' Lateral: 2.7 cm

## 2023-11-02 DIAGNOSIS — Z8679 Personal history of other diseases of the circulatory system: Secondary | ICD-10-CM | POA: Diagnosis not present

## 2023-11-02 DIAGNOSIS — I10 Essential (primary) hypertension: Secondary | ICD-10-CM | POA: Diagnosis not present

## 2023-11-02 DIAGNOSIS — I340 Nonrheumatic mitral (valve) insufficiency: Secondary | ICD-10-CM | POA: Diagnosis not present

## 2023-11-02 DIAGNOSIS — I4891 Unspecified atrial fibrillation: Secondary | ICD-10-CM | POA: Diagnosis not present

## 2023-11-02 DIAGNOSIS — E785 Hyperlipidemia, unspecified: Secondary | ICD-10-CM | POA: Diagnosis not present

## 2023-11-03 DIAGNOSIS — E559 Vitamin D deficiency, unspecified: Secondary | ICD-10-CM | POA: Diagnosis not present

## 2023-11-03 DIAGNOSIS — E039 Hypothyroidism, unspecified: Secondary | ICD-10-CM | POA: Diagnosis not present

## 2023-11-03 DIAGNOSIS — I4891 Unspecified atrial fibrillation: Secondary | ICD-10-CM | POA: Diagnosis not present

## 2023-11-03 DIAGNOSIS — E785 Hyperlipidemia, unspecified: Secondary | ICD-10-CM | POA: Diagnosis not present

## 2023-11-03 DIAGNOSIS — I10 Essential (primary) hypertension: Secondary | ICD-10-CM | POA: Diagnosis not present

## 2023-11-05 ENCOUNTER — Ambulatory Visit: Payer: Self-pay | Admitting: Internal Medicine

## 2023-11-06 ENCOUNTER — Telehealth: Payer: Self-pay | Admitting: Internal Medicine

## 2023-11-06 DIAGNOSIS — I340 Nonrheumatic mitral (valve) insufficiency: Secondary | ICD-10-CM | POA: Diagnosis not present

## 2023-11-06 DIAGNOSIS — Z8679 Personal history of other diseases of the circulatory system: Secondary | ICD-10-CM | POA: Diagnosis not present

## 2023-11-06 DIAGNOSIS — E782 Mixed hyperlipidemia: Secondary | ICD-10-CM | POA: Diagnosis not present

## 2023-11-06 DIAGNOSIS — I10 Essential (primary) hypertension: Secondary | ICD-10-CM | POA: Diagnosis not present

## 2023-11-06 NOTE — Telephone Encounter (Signed)
 Copied from CRM [id number]. Topic: Referral - Request for Referral >> Nov 06, 2023  3:28 PM Rea C wrote: Did the patient discuss referral with their provider in the last year? Patient did with heart doctor but she does have an appt with her on September 29th.  (If No - schedule appointment) (If Yes - send message)  Appointment offered? No  Type of order/referral and detailed reason for visit: Pulmonologist so patient can be accessed for oxygen    Preference of office, provider, location: A clinic or provider that's close to patient.   If referral order, have you been seen by this specialty before? No (If Yes, this issue or another issue? When? Where?  Can we respond through MyChart? Yes

## 2023-11-24 ENCOUNTER — Emergency Department (HOSPITAL_COMMUNITY)
Admission: EM | Admit: 2023-11-24 | Discharge: 2023-11-24 | Disposition: A | Discharge status: Home / Self Care | Attending: Emergency Medicine | Admitting: Emergency Medicine

## 2023-11-24 ENCOUNTER — Other Ambulatory Visit: Payer: Self-pay

## 2023-11-24 DIAGNOSIS — Z79899 Other long term (current) drug therapy: Secondary | ICD-10-CM | POA: Insufficient documentation

## 2023-11-24 DIAGNOSIS — H1132 Conjunctival hemorrhage, left eye: Secondary | ICD-10-CM | POA: Diagnosis not present

## 2023-11-24 DIAGNOSIS — H5789 Other specified disorders of eye and adnexa: Secondary | ICD-10-CM | POA: Diagnosis present

## 2023-11-24 DIAGNOSIS — I10 Essential (primary) hypertension: Secondary | ICD-10-CM | POA: Diagnosis not present

## 2023-11-24 NOTE — ED Provider Notes (Signed)
 [place] EMERGENCY DEPARTMENT AT [facility] Provider Note   CSN: [id number] Arrival date & time: 11/24/23  8171     Patient presents with: Eye Problem   Diane Nielsen is a 68 y.o. female with past medical history of hypertension, mitral valve replacement, A-fib is presenting to emergency room with complaint of 4 days of left eye redness.  She had no injury.  She has no pain.  She has no vision changes.  She has no discharge.  She has no other complaints and having left eye redness.    Eye Problem Associated symptoms: redness        Prior to Admission medications   Medication Sig Start Date End Date Taking? Authorizing Provider  albuterol  (VENTOLIN  HFA) 108 (90 Base) MCG/ACT inhaler TAKE 2 PUFFS BY MOUTH EVERY 6 HOURS AS NEEDED FOR WHEEZE OR SHORTNESS OF BREATH 03/17/23   Theophilus Andrews, Tully GRADE, MD  amiodarone  (PACERONE ) 200 MG tablet Take 1 tablet (200 mg total) by mouth 2 (two) times daily. Patient taking differently: Take 100 mg by mouth daily. 1/2 tablet daily 04/02/17   Levern Hutching, MD  levothyroxine  (SYNTHROID ) 125 MCG tablet TAKE 1 TABLET BY MOUTH EVERY DAY BEFORE BREAKFAST 10/13/23   Theophilus Andrews, Tully GRADE, MD  losartan  (COZAAR ) 25 MG tablet Take 25 mg by mouth daily. 01/22/23   [provider]  risperiDONE (RISPERDAL) 0.5 MG tablet Take 0.5 mg by mouth at bedtime. 09/04/23   [provider]  rosuvastatin (CRESTOR) 10 MG tablet Take 10 mg by mouth at bedtime. 09/25/21   [provider]  sertraline (ZOLOFT) 100 MG tablet Take 150 mg by mouth daily. 07/15/23   [provider]  traZODone (DESYREL) 150 MG tablet Take 150 mg by mouth at bedtime. 09/04/23   [provider]  warfarin (COUMADIN ) 4 MG tablet Take 1 tablet (4 mg total) by mouth daily at 6 PM. Patient taking differently: Take 4 mg by mouth See admin instructions. Take 1 tablet by mouth daily at 6pm, except Sunday 04/03/17   Levern Hutching, MD    Allergies:  Patient has no known allergies.    Review of Systems  Eyes:  Positive for redness.    Updated Vital Signs BP 131/84   Pulse 84   Temp 97.9 F (36.6 C)   Resp 16   SpO2 94%   Physical Exam Vitals and nursing note reviewed.  Constitutional:      General: She is not in acute distress.    Appearance: She is not toxic-appearing.  HENT:     Head: Normocephalic and atraumatic.  Eyes:     General: No scleral icterus.    Conjunctiva/sclera: Conjunctivae normal.     Comments: Patient has left subconjunctival hemorrhage.  Pupils are equal and reactive.  She has normal range of motion of her eye.  No discharge.  Cardiovascular:     Rate and Rhythm: Normal rate and regular rhythm.     Pulses: Normal pulses.     Heart sounds: Normal heart sounds.  Pulmonary:     Effort: Pulmonary effort is normal. No respiratory distress.     Breath sounds: Normal breath sounds.  Abdominal:     General: Abdomen is flat. Bowel sounds are normal.     Palpations: Abdomen is soft.     Tenderness: There is no abdominal tenderness.  Skin:    General: Skin is warm and dry.     Findings: No lesion.  Neurological:  General: No focal deficit present.     Mental Status: She is alert and oriented to person, place, and time. Mental status is at baseline.     (all labs ordered are listed, but only abnormal results are displayed) Labs Reviewed - No data to display  EKG: None  Radiology: No results found.   Procedures   Medications Ordered in the ED - No data to display                                  Medical Decision Making  This patient presents to the ED for concern of right eye, this involves an extensive number of treatment options, and is a complaint that carries with it a high risk of complications and morbidity.  The differential diagnosis includes subconjunctival hemorrhage, glaucoma, globe rupture, uveitis    Cardiac Monitoring: / EKG:  The patient was maintained on a cardiac  monitor.     Problem List / ED Course / Critical interventions / Medication management  Patient presents with red eye.  On exam she has subconjunctival hemorrhage.  Her pupils are equal and reactive.  She does not have pain thus I do not suspect this is bilateral acute glaucoma.  Neuro pupils are equal and reactive.  She does not have any pain that would make me suspect that this is secondary to a corneal abrasion or corneal ulcer.  She is not having discharge.  We discussed that this is a benign condition typically requiring no treatment.  I suspect this will resolve in a few days.  I did recommend that she follow-up with her eye doctor for recheck of symptoms.  She was given return precautions.         Final diagnoses:  Subconjunctival hemorrhage of left eye    ED Discharge Orders     None          Shermon Warren LOISE RIGGERS 11/24/23 2009    Freddi Hamilton, MD 11/24/23 2135

## 2023-11-24 NOTE — ED Triage Notes (Signed)
 Pt is on coumadin  and has a left subconjunctival hemorrhage x4 days.  No pain, no trauma, no issues with her vision.

## 2023-11-24 NOTE — Discharge Instructions (Signed)
 Please follow-up with the eye doctor in 3 to 5 days.  This should resolve on its own.  Return to ER with new or worsening symptoms.

## 2023-11-30 DIAGNOSIS — I4891 Unspecified atrial fibrillation: Secondary | ICD-10-CM | POA: Diagnosis not present

## 2023-12-07 ENCOUNTER — Encounter: Payer: Self-pay | Admitting: Internal Medicine

## 2023-12-07 ENCOUNTER — Ambulatory Visit: Admitting: Internal Medicine

## 2023-12-07 VITALS — BP 120/84 | HR 80 | Temp 97.7°F | Ht 65.0 in | Wt 158.2 lb

## 2023-12-07 DIAGNOSIS — I480 Paroxysmal atrial fibrillation: Secondary | ICD-10-CM

## 2023-12-07 DIAGNOSIS — E039 Hypothyroidism, unspecified: Secondary | ICD-10-CM | POA: Diagnosis not present

## 2023-12-07 DIAGNOSIS — Z0000 Encounter for general adult medical examination without abnormal findings: Secondary | ICD-10-CM | POA: Diagnosis not present

## 2023-12-07 DIAGNOSIS — Z720 Tobacco use: Secondary | ICD-10-CM

## 2023-12-07 DIAGNOSIS — Z13820 Encounter for screening for osteoporosis: Secondary | ICD-10-CM

## 2023-12-07 DIAGNOSIS — Z23 Encounter for immunization: Secondary | ICD-10-CM

## 2023-12-07 DIAGNOSIS — Z122 Encounter for screening for malignant neoplasm of respiratory organs: Secondary | ICD-10-CM

## 2023-12-07 LAB — LIPID PANEL
Cholesterol: 135 mg/dL (ref 0–200)
HDL: 37.6 mg/dL — ABNORMAL LOW (ref >39.00)
LDL Cholesterol: 71 mg/dL (ref 0–99)
NonHDL: 97.19
Total CHOL/HDL Ratio: 4
Triglycerides: 130 mg/dL (ref 0.0–149.0)
VLDL: 26 mg/dL (ref 0.0–40.0)

## 2023-12-07 LAB — TSH: TSH: 7.06 u[IU]/mL — ABNORMAL HIGH (ref 0.35–5.50)

## 2023-12-07 LAB — CBC WITH DIFFERENTIAL/PLATELET
Basophils Absolute: 0 K/uL (ref 0.0–0.1)
Basophils Relative: 0.5 % (ref 0.0–3.0)
Eosinophils Absolute: 0.1 K/uL (ref 0.0–0.7)
Eosinophils Relative: 0.9 % (ref 0.0–5.0)
HCT: 44.6 % (ref 36.0–46.0)
Hemoglobin: 14.6 g/dL (ref 12.0–15.0)
Lymphocytes Relative: 15.5 % (ref 12.0–46.0)
Lymphs Abs: 1 K/uL (ref 0.7–4.0)
MCHC: 32.8 g/dL (ref 30.0–36.0)
MCV: 92.1 fl (ref 78.0–100.0)
Monocytes Absolute: 0.3 K/uL (ref 0.1–1.0)
Monocytes Relative: 5.2 % (ref 3.0–12.0)
Neutro Abs: 5.2 K/uL (ref 1.4–7.7)
Neutrophils Relative %: 77.9 % — ABNORMAL HIGH (ref 43.0–77.0)
Platelets: 115 K/uL — ABNORMAL LOW (ref 150.0–400.0)
RBC: 4.84 Mil/uL (ref 3.87–5.11)
RDW: 16 % — ABNORMAL HIGH (ref 11.5–15.5)
WBC: 6.7 K/uL (ref 4.0–10.5)

## 2023-12-07 LAB — COMPREHENSIVE METABOLIC PANEL WITH GFR
ALT: 11 U/L (ref 0–35)
AST: 17 U/L (ref 0–37)
Albumin: 4.2 g/dL (ref 3.5–5.2)
Alkaline Phosphatase: 59 U/L (ref 39–117)
BUN: 12 mg/dL (ref 6–23)
CO2: 28 meq/L (ref 19–32)
Calcium: 9.4 mg/dL (ref 8.4–10.5)
Chloride: 103 meq/L (ref 96–112)
Creatinine, Ser: 1 mg/dL (ref 0.40–1.20)
GFR: 58.8 mL/min — ABNORMAL LOW (ref >60.00)
Glucose, Bld: 96 mg/dL (ref 70–99)
Potassium: 4.6 meq/L (ref 3.5–5.1)
Sodium: 138 meq/L (ref 135–145)
Total Bilirubin: 0.5 mg/dL (ref 0.2–1.2)
Total Protein: 7.3 g/dL (ref 6.0–8.3)

## 2023-12-07 LAB — VITAMIN B12: Vitamin B-12: 143 pg/mL — ABNORMAL LOW (ref 211–911)

## 2023-12-07 NOTE — Progress Notes (Signed)
 Established Patient Office Visit     CC/Reason for Visit: Welcome to Medicare visit  HPI: Diane Nielsen is a 68 y.o. female who is coming in today for the above mentioned reasons. Past Medical History is significant for: Hypothyroidism, atrial fibrillation and mitral valve replacement on Coumadin , pulmonary hypertension, her cardiologist is Dr. Levern.  She is feeling well without concerns or complaints.  Has routine eye and dental care.  Is due for COVID, flu, pneumonia, RSV, shingles vaccinations.  She is a smoker of about two thirds of a pack a day for many years.  She has not had lung cancer screening.  Is due for bone density.   Past Medical/Surgical History: Past Medical History:  Diagnosis Date   Acute exacerbation of CHF (congestive heart failure) (HCC) 02/27/2017   Arthritis    Hypertension     Past Surgical History:  Procedure Laterality Date   CLIPPING OF ATRIAL APPENDAGE N/A 03/04/2017   Procedure: CLIPPING OF ATRIAL APPENDAGE;  Surgeon: Fleeta Hanford Coy, MD;  Location: [facility] OR;  Service: Open Heart Surgery;  Laterality: N/A;   MAZE N/A 03/04/2017   Procedure: MAZE;  Surgeon: Fleeta Hanford Coy, MD;  Location: [facility] OR;  Service: Open Heart Surgery;  Laterality: N/A;   MITRAL VALVE REPLACEMENT N/A 03/04/2017   Procedure: MITRAL VALVE (MV) REPLACEMENT, MAZE PROCEDURE;  Surgeon: Fleeta Hanford Coy, MD;  Location: [facility] OR;  Service: Open Heart Surgery;  Laterality: N/A;   RIGHT/LEFT HEART CATH AND CORONARY ANGIOGRAPHY N/A 03/02/2017   Procedure: RIGHT/LEFT HEART CATH AND CORONARY ANGIOGRAPHY;  Surgeon: Levern Hutching, MD;  Location: [facility] INVASIVE CV LAB;  Service: Cardiovascular;  Laterality: N/A;    Social History:  reports that she has been smoking cigarettes. She started smoking about 37 years ago. She has a 15 pack-year smoking history. She has quit using smokeless tobacco. She reports that she does not currently use alcohol. She reports that she does not use  drugs.  Allergies: No Known Allergies  Family History:  Family History  Problem Relation Age of Onset   Lung cancer Mother    Lung cancer Sister    Breast cancer Neg Hx      Current Outpatient Medications:    albuterol  (VENTOLIN  HFA) 108 (90 Base) MCG/ACT inhaler, TAKE 2 PUFFS BY MOUTH EVERY 6 HOURS AS NEEDED FOR WHEEZE OR SHORTNESS OF BREATH, Disp: 18 each, Rfl: 0   amiodarone  (PACERONE ) 200 MG tablet, Take 1 tablet (200 mg total) by mouth 2 (two) times daily., Disp: 60 tablet, Rfl: 3   levothyroxine  (SYNTHROID ) 125 MCG tablet, TAKE 1 TABLET BY MOUTH EVERY DAY BEFORE BREAKFAST, Disp: 90 tablet, Rfl: 0   losartan  (COZAAR ) 25 MG tablet, Take 25 mg by mouth daily., Disp: , Rfl:    risperiDONE (RISPERDAL) 0.5 MG tablet, Take 0.5 mg by mouth at bedtime., Disp: , Rfl:    rosuvastatin (CRESTOR) 10 MG tablet, Take 10 mg by mouth at bedtime., Disp: , Rfl:    sertraline (ZOLOFT) 100 MG tablet, Take 150 mg by mouth daily., Disp: , Rfl:    traZODone (DESYREL) 150 MG tablet, Take 150 mg by mouth at bedtime., Disp: , Rfl:    warfarin (COUMADIN ) 4 MG tablet, Take 1 tablet (4 mg total) by mouth daily at 6 PM., Disp: 35 tablet, Rfl: 3  Review of Systems:  Negative unless indicated in HPI.   Physical Exam: Vitals:   12/07/23 0945  BP: 120/84  Pulse: 80  Temp: 97.7 F (36.5  C)  TempSrc: Oral  SpO2: 96%  Weight: 158 lb 3.2 oz (71.8 kg)  Height: 5' 5 (1.651 m)    Body mass index is 26.33 kg/m.   Physical Exam Vitals reviewed.  Constitutional:      General: She is not in acute distress.    Appearance: Normal appearance. She is not ill-appearing, toxic-appearing or diaphoretic.  HENT:     Head: Normocephalic.     Right Ear: Tympanic membrane, ear canal and external ear normal. There is no impacted cerumen.     Left Ear: Tympanic membrane, ear canal and external ear normal. There is no impacted cerumen.     Nose: Nose normal.     Mouth/Throat:     Mouth: Mucous membranes are moist.      Pharynx: Oropharynx is clear. No oropharyngeal exudate or posterior oropharyngeal erythema.  Eyes:     General: No scleral icterus.       Right eye: No discharge.        Left eye: No discharge.     Conjunctiva/sclera: Conjunctivae normal.     Pupils: Pupils are equal, round, and reactive to light.  Neck:     Vascular: No carotid bruit.  Cardiovascular:     Rate and Rhythm: Normal rate and regular rhythm.     Pulses: Normal pulses.     Heart sounds: Normal heart sounds.  Pulmonary:     Effort: Pulmonary effort is normal. No respiratory distress.     Breath sounds: Normal breath sounds.  Abdominal:     General: Abdomen is flat. Bowel sounds are normal.     Palpations: Abdomen is soft.  Musculoskeletal:        General: Normal range of motion.     Cervical back: Normal range of motion.  Skin:    General: Skin is warm and dry.  Neurological:     General: No focal deficit present.     Mental Status: She is alert and oriented to person, place, and time. Mental status is at baseline.  Psychiatric:        Mood and Affect: Mood normal.        Behavior: Behavior normal.        Thought Content: Thought content normal.        Judgment: Judgment normal.   Welcome to Medicare wellness visit   1. Risk factors, based on past  M,S,F - Cardiac Risk Factors include: advanced age (>65men, >80 women);dyslipidemia;hypertension   2.  Physical activities: Dietary issues and exercise activities discussed:      3.  Depression/mood:  Flowsheet Row Office Visit from 04/15/2023 in [facility] Total Score 0     4.  ADL's:    12/07/2023    9:36 AM  In your present state of health, do you have any difficulty performing the following activities:  Hearing? 0  Vision? 0  Difficulty concentrating or making decisions? 0  Walking or climbing stairs? 0  Dressing or bathing? 0  Doing errands, shopping? 0  Preparing Food and eating ? N  Using the Toilet? N   In the past six months, have you accidently leaked urine? Y  Do you have problems with loss of bowel control? N  Managing your Medications? N  Managing your Finances? N  Housekeeping or managing your Housekeeping? N     5.  Fall risk:     06/10/2022    1:06 PM 11/06/2022   11:35 AM 04/15/2023  1:10 PM 09/29/2023    7:58 AM 12/07/2023    9:38 AM  Fall Risk  Falls in the past year? 0 0 1 0 0  Was there an injury with Fall? 0 0 0 0 0  Fall Risk Category Calculator 0 0 2 0 0  Fall risk Follow up Falls evaluation completed Falls evaluation completed Falls evaluation completed Falls evaluation completed Falls evaluation completed     6.  Home safety: No problems identified   7.  Height weight, and visual acuity: height and weight as above, vision/hearing: Vision Screening   Right eye Left eye Both eyes  Without correction 20/40 20/40 20/40   With correction        8.  Counseling: Ready to quit: Not Answered Counseling given: Not Answered    9. Lab orders based on risk factors: Laboratory update will be reviewed   10. Cognitive assessment:        12/07/2023    9:40 AM  6CIT Screen  What Year? 0 points  What month? 0 points  What time? 0 points  Count back from 20 0 points  Months in reverse 0 points  Repeat phrase 0 points  Total Score 0 points     11. Screening: Patient provided with a written and personalized 5-10 year screening schedule in the AVS. Health Maintenance  Topic Date Due   Hepatitis C Screening  Never done   Zoster (Shingles) Vaccine (1 of 2) Never done   Pneumococcal Vaccine for age over 3 (2 of 2 - PCV) 03/22/2013   DEXA scan (bone density measurement)  08/27/2022   Flu Shot  10/09/2023   COVID-19 Vaccine (4 - 2025-26 season) 11/09/2023   Medicare Annual Wellness Visit  12/06/2024   Colon Cancer Screening  06/10/2025   Breast Cancer Screening  07/26/2025   DTaP/Tdap/Td vaccine (2 - Td or Tdap) 01/03/2027   HPV Vaccine  Aged Out   Meningitis B  Vaccine  Aged Out    12. Provider List Update: Patient Care Team    Relationship Specialty Notifications Start End  Theophilus Andrews, Tully GRADE, MD PCP - General Internal Medicine  06/10/21   Levern Hutching, MD Consulting Physician Cardiology  10/29/21      13. Advance Directives: Does Patient Have a Medical Advance Directive?: No Would patient like information on creating a medical advance directive?: No - Patient declined  14. Opioids: Patient is not on any opioid prescriptions and has no risk factors for a substance use disorder.   15.   Goals      Quit Smoking         I have personally reviewed and noted the following in the patient's chart:   Medical and social history Use of alcohol, tobacco or illicit drugs  Current medications and supplements Functional ability and status Nutritional status Physical activity Advanced directives List of other physicians Hospitalizations, surgeries, and ER visits in previous 12 months Vitals Screenings to include cognitive, depression, and falls Referrals and appointments  In addition, I have reviewed and discussed with patient certain preventive protocols, quality metrics, and best practice recommendations. A written personalized care plan for preventive services as well as general preventive health recommendations were provided to patient.   Impression and Plan:  Welcome to Medicare preventive visit  Hypothyroidism, unspecified type -     TSH; Future  Paroxysmal atrial fibrillation (HCC) -     CBC with Differential/Platelet; Future -     Comprehensive metabolic panel with GFR; Future -  Vitamin B12; Future -     VITAMIN D  25 Hydroxy (Vit-D Deficiency, Fractures); Future  Tobacco abuse -     Lipid panel; Future -     Ambulatory Referral for Lung Cancer Scre  Screening for osteoporosis -     DG Bone Density; Future  Screening for lung cancer  Immunization due   -Recommend routine eye and dental care. -Healthy  lifestyle discussed in detail. -Labs to be updated today. -Prostate cancer screening: N/A Health Maintenance  Topic Date Due   Hepatitis C Screening  Never done   Zoster (Shingles) Vaccine (1 of 2) Never done   Pneumococcal Vaccine for age over 87 (2 of 2 - PCV) 03/22/2013   DEXA scan (bone density measurement)  08/27/2022   Flu Shot  10/09/2023   COVID-19 Vaccine (4 - 2025-26 season) 11/09/2023   Medicare Annual Wellness Visit  12/06/2024   Colon Cancer Screening  06/10/2025   Breast Cancer Screening  07/26/2025   DTaP/Tdap/Td vaccine (2 - Td or Tdap) 01/03/2027   HPV Vaccine  Aged Out   Meningitis B Vaccine  Aged Out     - Flu and PCV 20 in office today. -will obtain RSV, COVID and shingles at pharmacy. - Sent for DEXA scan. - Sent for lung cancer screening.     [name], MD [place] [facility]

## 2023-12-08 ENCOUNTER — Ambulatory Visit: Payer: Self-pay | Admitting: Internal Medicine

## 2023-12-08 DIAGNOSIS — E039 Hypothyroidism, unspecified: Secondary | ICD-10-CM

## 2023-12-08 DIAGNOSIS — E559 Vitamin D deficiency, unspecified: Secondary | ICD-10-CM

## 2023-12-08 DIAGNOSIS — E538 Deficiency of other specified B group vitamins: Secondary | ICD-10-CM

## 2023-12-08 LAB — VITAMIN D 25 HYDROXY (VIT D DEFICIENCY, FRACTURES): VITD: 18.48 ng/mL — ABNORMAL LOW (ref 30.00–100.00)

## 2023-12-08 MED ORDER — VITAMIN D (ERGOCALCIFEROL) 1.25 MG (50000 UNIT) PO CAPS: 50000.0000 [IU] | ORAL_CAPSULE | ORAL | 0 refills | Status: AC

## 2023-12-09 ENCOUNTER — Inpatient Hospital Stay: Admission: RE | Admit: 2023-12-09 | Discharge: 2023-12-09 | Attending: Internal Medicine | Admitting: Internal Medicine

## 2023-12-09 DIAGNOSIS — Z13820 Encounter for screening for osteoporosis: Secondary | ICD-10-CM

## 2023-12-09 DIAGNOSIS — M810 Age-related osteoporosis without current pathological fracture: Secondary | ICD-10-CM

## 2023-12-14 ENCOUNTER — Telehealth: Payer: Self-pay | Admitting: *Deleted

## 2023-12-14 DIAGNOSIS — E039 Hypothyroidism, unspecified: Secondary | ICD-10-CM

## 2023-12-14 NOTE — Telephone Encounter (Signed)
 Copied from CRM [id number]. Topic: Clinical - Lab/Test Results >> Dec 14, 2023  4:33 PM Rea ORN wrote: Reason for CRM: Pt called back and was given lab results. Pt advised that she wants to have B12 injections in clinic. Pt also advised that she is taking Levothyroxine  first thing in morning 30 min prior to food and other medications.

## 2023-12-15 ENCOUNTER — Other Ambulatory Visit: Payer: Self-pay | Admitting: Internal Medicine

## 2023-12-15 DIAGNOSIS — E039 Hypothyroidism, unspecified: Secondary | ICD-10-CM

## 2023-12-15 MED ORDER — LEVOTHYROXINE SODIUM 137 MCG PO TABS: 137.0000 ug | ORAL_TABLET | Freq: Every day | ORAL | 1 refills | Status: DC

## 2023-12-15 NOTE — Addendum Note (Signed)
 Addended by: KATHRYNE MILLMAN B on: 12/15/2023 10:14 AM   Modules accepted: Orders

## 2023-12-15 NOTE — Telephone Encounter (Signed)
 Left message on machine for patient to schedule a lab appointment and B12 injection.

## 2023-12-16 ENCOUNTER — Encounter: Payer: Self-pay | Admitting: Internal Medicine

## 2023-12-16 ENCOUNTER — Ambulatory Visit: Admitting: Internal Medicine

## 2023-12-16 ENCOUNTER — Other Ambulatory Visit: Payer: Self-pay

## 2023-12-16 VITALS — BP 124/80 | HR 82 | Temp 98.1°F | Wt 161.7 lb

## 2023-12-16 DIAGNOSIS — M810 Age-related osteoporosis without current pathological fracture: Secondary | ICD-10-CM

## 2023-12-16 DIAGNOSIS — E538 Deficiency of other specified B group vitamins: Secondary | ICD-10-CM

## 2023-12-16 MED ORDER — DENOSUMAB-BBDZ 60 MG/ML ~~LOC~~ SOSY
60.0000 mg | PREFILLED_SYRINGE | Freq: Once | SUBCUTANEOUS | Status: AC
  Administered 2023-12-30: 60 mg via SUBCUTANEOUS

## 2023-12-16 MED ORDER — CYANOCOBALAMIN 1000 MCG/ML IJ SOLN
1000.0000 ug | Freq: Once | INTRAMUSCULAR | Status: AC
  Administered 2023-12-16: 1000 ug via INTRAMUSCULAR

## 2023-12-16 NOTE — Addendum Note (Signed)
 Addended by: KATHRYNE MILLMAN B on: 12/16/2023 04:19 PM   Modules accepted: Orders

## 2023-12-16 NOTE — Progress Notes (Signed)
 Established Patient Office Visit     CC/Reason for Visit: Discuss DEXA scan results  HPI: Diane Nielsen is a 68 y.o. female who is coming in today for the above mentioned reasons.  She was recently diagnosed with osteoporosis based on DEXA scan results.  Here to discuss treatment.  She was also diagnosed with B12 deficiency and is here for her first IM injection.   Past Medical/Surgical History: Past Medical History:  Diagnosis Date   Acute exacerbation of CHF (congestive heart failure) (HCC) 02/27/2017   Arthritis    Hypertension     Past Surgical History:  Procedure Laterality Date   CLIPPING OF ATRIAL APPENDAGE N/A 03/04/2017   Procedure: CLIPPING OF ATRIAL APPENDAGE;  Surgeon: Fleeta Hanford Coy, MD;  Location: [facility] OR;  Service: Open Heart Surgery;  Laterality: N/A;   MAZE N/A 03/04/2017   Procedure: MAZE;  Surgeon: Fleeta Hanford Coy, MD;  Location: [facility] OR;  Service: Open Heart Surgery;  Laterality: N/A;   MITRAL VALVE REPLACEMENT N/A 03/04/2017   Procedure: MITRAL VALVE (MV) REPLACEMENT, MAZE PROCEDURE;  Surgeon: Fleeta Hanford Coy, MD;  Location: [facility] OR;  Service: Open Heart Surgery;  Laterality: N/A;   RIGHT/LEFT HEART CATH AND CORONARY ANGIOGRAPHY N/A 03/02/2017   Procedure: RIGHT/LEFT HEART CATH AND CORONARY ANGIOGRAPHY;  Surgeon: Levern Hutching, MD;  Location: [facility] INVASIVE CV LAB;  Service: Cardiovascular;  Laterality: N/A;    Social History:  reports that she has been smoking cigarettes. She started smoking about 37 years ago. She has a 15 pack-year smoking history. She has quit using smokeless tobacco. She reports that she does not currently use alcohol. She reports that she does not use drugs.  Allergies: No Known Allergies  Family History:  Family History  Problem Relation Age of Onset   Lung cancer Mother    Lung cancer Sister    Breast cancer Neg Hx      Current Outpatient Medications:    albuterol  (VENTOLIN  HFA) 108 (90 Base) MCG/ACT inhaler, TAKE 2  PUFFS BY MOUTH EVERY 6 HOURS AS NEEDED FOR WHEEZE OR SHORTNESS OF BREATH, Disp: 18 each, Rfl: 0   amiodarone  (PACERONE ) 200 MG tablet, Take 1 tablet (200 mg total) by mouth 2 (two) times daily., Disp: 60 tablet, Rfl: 3   levothyroxine  (SYNTHROID ) 137 MCG tablet, Take 1 tablet (137 mcg total) by mouth daily before breakfast. TAKE 1 TABLET BY MOUTH EVERY DAY BEFORE BREAKFAST, Disp: 90 tablet, Rfl: 1   losartan  (COZAAR ) 25 MG tablet, Take 25 mg by mouth daily., Disp: , Rfl:    risperiDONE (RISPERDAL) 0.5 MG tablet, Take 0.5 mg by mouth at bedtime., Disp: , Rfl:    rosuvastatin (CRESTOR) 10 MG tablet, Take 10 mg by mouth at bedtime., Disp: , Rfl:    sertraline (ZOLOFT) 100 MG tablet, Take 150 mg by mouth daily., Disp: , Rfl:    traZODone (DESYREL) 150 MG tablet, Take 150 mg by mouth at bedtime., Disp: , Rfl:    Vitamin D , Ergocalciferol , (DRISDOL) 1.25 MG (50000 UNIT) CAPS capsule, Take 1 capsule (50,000 Units total) by mouth every 7 (seven) days for 12 doses., Disp: 12 capsule, Rfl: 0   warfarin (COUMADIN ) 4 MG tablet, Take 1 tablet (4 mg total) by mouth daily at 6 PM., Disp: 35 tablet, Rfl: 3  Current Facility-Administered Medications:    denosumab-bbdz SOSY 60 mg, 60 mg, Subcutaneous, Once, Theophilus Andrews, Tully GRADE, MD  Review of Systems:  Negative unless indicated in HPI.   Physical  Exam: Vitals:   12/16/23 1405  BP: 124/80  Pulse: 82  Temp: 98.1 F (36.7 C)  TempSrc: Oral  SpO2: 96%  Weight: 161 lb 11.2 oz (73.3 kg)    Body mass index is 26.91 kg/m.    Impression and Plan:  Age-related osteoporosis without current pathological fracture  B12 deficiency   - After risk-benefit discussion she has elected to try Prolia or a similar injectable.  Will send to Prolia portal. - IM B12 given in office today.  Time spent:31 minutes reviewing chart, interviewing and examining patient and formulating plan of care.     [name], MD [place] [facility] at  [facility]

## 2023-12-16 NOTE — Telephone Encounter (Signed)
 Patient is in the office today to discuss b12 injections and dexa results.  Lab appointment was scheduled.

## 2023-12-17 ENCOUNTER — Ambulatory Visit
Admission: RE | Admit: 2023-12-17 | Discharge: 2023-12-17 | Disposition: A | Discharge status: Home / Self Care | Source: Ambulatory Visit | Attending: Acute Care | Admitting: Acute Care

## 2023-12-17 DIAGNOSIS — Z87891 Personal history of nicotine dependence: Secondary | ICD-10-CM

## 2023-12-17 DIAGNOSIS — F17210 Nicotine dependence, cigarettes, uncomplicated: Secondary | ICD-10-CM

## 2023-12-17 DIAGNOSIS — Z122 Encounter for screening for malignant neoplasm of respiratory organs: Secondary | ICD-10-CM

## 2023-12-21 ENCOUNTER — Other Ambulatory Visit: Payer: Self-pay

## 2023-12-21 DIAGNOSIS — Z87891 Personal history of nicotine dependence: Secondary | ICD-10-CM

## 2023-12-21 DIAGNOSIS — F17210 Nicotine dependence, cigarettes, uncomplicated: Secondary | ICD-10-CM

## 2023-12-21 DIAGNOSIS — Z122 Encounter for screening for malignant neoplasm of respiratory organs: Secondary | ICD-10-CM

## 2023-12-22 ENCOUNTER — Other Ambulatory Visit (HOSPITAL_COMMUNITY): Payer: Self-pay

## 2023-12-22 ENCOUNTER — Telehealth: Payer: Self-pay

## 2023-12-22 NOTE — Telephone Encounter (Signed)
 Jubbonti is now preferred for patient's insurance (pharmacy benefit). No PA is required and patient's copay is $0.

## 2023-12-23 ENCOUNTER — Ambulatory Visit (INDEPENDENT_AMBULATORY_CARE_PROVIDER_SITE_OTHER)

## 2023-12-23 DIAGNOSIS — E538 Deficiency of other specified B group vitamins: Secondary | ICD-10-CM

## 2023-12-23 MED ORDER — CYANOCOBALAMIN 1000 MCG/ML IJ SOLN
1000.0000 ug | Freq: Once | INTRAMUSCULAR | Status: AC
  Administered 2023-12-23: 1000 ug via INTRAMUSCULAR

## 2023-12-23 NOTE — Progress Notes (Signed)
 Patient is in office today for a nurse visit for B12 Injection. Patient Injection was given in the  Left deltoid. Patient tolerated injection well.

## 2023-12-24 DIAGNOSIS — I4891 Unspecified atrial fibrillation: Secondary | ICD-10-CM | POA: Diagnosis not present

## 2023-12-30 ENCOUNTER — Ambulatory Visit

## 2023-12-30 ENCOUNTER — Ambulatory Visit: Admitting: *Deleted

## 2023-12-30 DIAGNOSIS — M810 Age-related osteoporosis without current pathological fracture: Secondary | ICD-10-CM | POA: Diagnosis not present

## 2023-12-30 DIAGNOSIS — E538 Deficiency of other specified B group vitamins: Secondary | ICD-10-CM

## 2023-12-30 MED ORDER — DENOSUMAB-BBDZ 60 MG/ML ~~LOC~~ SOSY: 60.0000 mg | PREFILLED_SYRINGE | Freq: Once | SUBCUTANEOUS | Status: AC

## 2023-12-30 MED ORDER — CYANOCOBALAMIN 1000 MCG/ML IJ SOLN
1000.0000 ug | Freq: Once | INTRAMUSCULAR | Status: AC
  Administered 2023-12-30: 1000 ug via INTRAMUSCULAR

## 2023-12-30 NOTE — Progress Notes (Signed)
 Per orders of Dr. Theophilus, injection of Jubbonti and B12 given by Vernell English. Patient tolerated injection well.

## 2023-12-31 ENCOUNTER — Telehealth: Payer: Self-pay

## 2023-12-31 NOTE — Telephone Encounter (Signed)
 Returned call. Reviewed recent lung CT results. No questions.

## 2024-01-01 ENCOUNTER — Other Ambulatory Visit: Payer: Self-pay | Admitting: Internal Medicine

## 2024-01-01 DIAGNOSIS — E559 Vitamin D deficiency, unspecified: Secondary | ICD-10-CM

## 2024-01-06 ENCOUNTER — Ambulatory Visit (INDEPENDENT_AMBULATORY_CARE_PROVIDER_SITE_OTHER): Admitting: *Deleted

## 2024-01-06 DIAGNOSIS — E538 Deficiency of other specified B group vitamins: Secondary | ICD-10-CM

## 2024-01-06 MED ORDER — CYANOCOBALAMIN 1000 MCG/ML IJ SOLN: 1000.0000 ug | Freq: Once | INTRAMUSCULAR | 0 refills | Status: DC

## 2024-01-06 MED ORDER — CYANOCOBALAMIN 1000 MCG/ML IJ SOLN
1000.0000 ug | Freq: Once | INTRAMUSCULAR | Status: AC
  Administered 2024-01-06: 1000 ug via INTRAMUSCULAR

## 2024-01-06 NOTE — Progress Notes (Signed)
Per orders of Dr. Burchette, injection of Cyanocobalamin 1000mcg given by Towanda Hornstein A. Patient tolerated injection well.  

## 2024-02-08 ENCOUNTER — Other Ambulatory Visit

## 2024-02-08 ENCOUNTER — Ambulatory Visit

## 2024-02-29 ENCOUNTER — Other Ambulatory Visit: Payer: Self-pay | Admitting: Internal Medicine

## 2024-02-29 ENCOUNTER — Ambulatory Visit: Payer: Self-pay

## 2024-02-29 DIAGNOSIS — R052 Subacute cough: Secondary | ICD-10-CM

## 2024-02-29 DIAGNOSIS — R062 Wheezing: Secondary | ICD-10-CM

## 2024-02-29 MED ORDER — ALBUTEROL SULFATE HFA 108 (90 BASE) MCG/ACT IN AERS: 1.0000 | INHALATION_SPRAY | Freq: Four times a day (QID) | RESPIRATORY_TRACT | 0 refills | Status: DC | PRN

## 2024-02-29 NOTE — Telephone Encounter (Signed)
 Request for albuterol  inhaler to [facility] on [address]. Advised 3 day turn around time.

## 2024-02-29 NOTE — Telephone Encounter (Signed)
 FYI Only or Action Required?: FYI only for provider: appointment scheduled on 03/01/24.  Patient was last seen in primary care on 12/16/2023 by Theophilus Andrews, Tully GRADE, MD.  Called Nurse Triage reporting Shortness of Breath.  Symptoms began several days ago.  Interventions attempted: OTC medications: Mucinex , vicks pearles.  Symptoms are: unchanged.  Triage Disposition: See PCP When Office is Open (Within 3 Days)  Patient/caregiver understands and will follow disposition?: Yes      Copied from CRM #[id number]. Topic: Clinical - Red Word Triage >> Feb 29, 2024  9:07 AM Avram MATSU wrote: Red Word that prompted transfer to Nurse Triage: sob/labor breathing and patient has asthma . Chest congestions in lungs can get up mucus. Has cold symptoms, felt like had a fever a couple of days ago. Reason for Disposition  [1] MODERATE longstanding difficulty breathing (e.g., speaks in phrases, SOB even at rest, pulse 100-120) AND [2] SAME as normal  Answer Assessment - Initial Assessment Questions 1. ONSET: When did the cough begin?      Couple of days ago  2. SEVERITY: How bad is the cough today?      Not as bad right now  3. SPUTUM: Describe the color of your sputum (e.g., none, dry cough; clear, white, yellow, green)     No  4. HEMOPTYSIS: Are you coughing up any blood? If Yes, ask: How much? (e.g., flecks, streaks, tablespoons, etc.)     No  5. DIFFICULTY BREATHING: Are you having difficulty breathing? If Yes, ask: How bad is it? (e.g., mild, moderate, severe)      Yes-with walking  6. FEVER: Do you have a fever? If Yes, ask: What is your temperature, how was it measured, and when did it start?     No  7. CARDIAC HISTORY: Do you have any history of heart disease? (e.g., heart attack, congestive heart failure)      Yes  8. LUNG HISTORY: Do you have any history of lung disease?  (e.g., pulmonary embolus, asthma, emphysema)     Yes   10. OTHER SYMPTOMS: Do  you have any other symptoms? (e.g., runny nose, wheezing, chest pain)       Some wheezing  Answer Assessment - Initial Assessment Questions Patient gave permission to speak with her friend Tammy. Tammy says patient has had a cold couple of days, chest congestion, coughing congested sounding cough with nothing coming up due to patient doesn't have the lung capacity to bring anything up. She says she needs the albuterol  inhaler so that she could use it. Tammy says patient left it at her home and Tammy would like to have one at her house where the patient is staying for the next couple of weeks. Tammy says her breathing is labored when she's up walking. She walked maybe 30 feet and couldn't get in a good breath of air. She says sitting she recovered and breathing is not as labored. Patient says she's able to lay flat and sleep at night, but restless legs is what keeps her up, no her breathing. Tammy says she gave her mucinex  pills and also vicks pearles. She says the cough is not bad right now. Advised first available is tomorrow at 7:30, she agreed to that visit with PCP.   1. RESPIRATORY STATUS: Describe your breathing? (e.g., wheezing, shortness of breath, unable to speak, severe coughing)      SOB with walking 2. ONSET: When did this breathing problem begin?      Couple of days  after colg  3. PATTERN Does the difficult breathing come and go, or has it been constant since it started?      Comes and goes with walking  4. SEVERITY: How bad is your breathing? (e.g., mild, moderate, severe)      Moderate    6. CARDIAC HISTORY: Do you have any history of heart disease? (e.g., heart attack, angina, bypass surgery, angioplasty)      Yes  7. LUNG HISTORY: Do you have any history of lung disease?  (e.g., pulmonary embolus, asthma, emphysema)     Yes  8. CAUSE: What do you think is causing the breathing problem?      From a cold  9. OTHER SYMPTOMS: Do you have any other symptoms?  (e.g., chest pain, cough, dizziness, fever, runny nose)     No  Protocols used: Breathing Difficulty-A-AH, Cough - Acute Non-Productive-A-AH

## 2024-03-01 ENCOUNTER — Encounter: Payer: Self-pay | Admitting: Internal Medicine

## 2024-03-01 ENCOUNTER — Ambulatory Visit: Admitting: Internal Medicine

## 2024-03-01 VITALS — BP 110/80 | HR 105 | Temp 97.4°F | Wt 157.7 lb

## 2024-03-01 DIAGNOSIS — J209 Acute bronchitis, unspecified: Secondary | ICD-10-CM

## 2024-03-01 MED ORDER — AMOXICILLIN-POT CLAVULANATE 875-125 MG PO TABS: 1.0000 | ORAL_TABLET | Freq: Two times a day (BID) | ORAL | 0 refills | Status: AC

## 2024-03-01 NOTE — Progress Notes (Signed)
 "    Established Patient Office Visit     CC/Reason for Visit: Wheezing, cough, shortness of breath  HPI: Diane Nielsen is a 68 y.o. female who is coming in today for the above mentioned reasons.  For the past week she has been dealing with URI symptoms colluding a productive cough, nasal congestion, rhinorrhea, postnasal drip.  Over the past 3 days she has noticed increased shortness of breath on exertion, some mild dizziness, no chest pain, no fever.  She smokes at least half a pack a day.   Past Medical/Surgical History: Past Medical History:  Diagnosis Date   Acute exacerbation of CHF (congestive heart failure) (HCC) 02/27/2017   Arthritis    Hypertension     Past Surgical History:  Procedure Laterality Date   CLIPPING OF ATRIAL APPENDAGE N/A 03/04/2017   Procedure: CLIPPING OF ATRIAL APPENDAGE;  Surgeon: Fleeta Hanford Coy, MD;  Location: [facility] OR;  Service: Open Heart Surgery;  Laterality: N/A;   MAZE N/A 03/04/2017   Procedure: MAZE;  Surgeon: Fleeta Hanford Coy, MD;  Location: [facility] OR;  Service: Open Heart Surgery;  Laterality: N/A;   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 She started smoking about 38 years ago. She has a 15 pack-year smoking history. She has quit using smokeless tobacco. She reports that she does not currently use alcohol. She reports that she does not use drugs.  Allergies: Allergies[1]  Family History:  Family History  Problem Relation Age of Onset   Lung cancer Mother    Lung cancer Sister    Breast  cancer Neg Hx     Current Medications[2]  Review of Systems:  Negative unless indicated in HPI.   Physical Exam: Vitals:   03/01/24 0731  BP: 110/80  Pulse: (!) 105  Temp: (!) 97.4 F (36.3 C)  TempSrc: Oral  SpO2: 90%  Weight: 157 lb 11.2 oz (71.5 kg)    Body mass index is 26.24 kg/m.   Physical Exam Vitals reviewed.  Constitutional:      Appearance: Normal appearance.  HENT:     Head: Normocephalic and atraumatic.  Eyes:     Conjunctiva/sclera: Conjunctivae normal.     Pupils: Pupils are equal, round, and reactive to light.  Cardiovascular:     Rate and Rhythm: Normal rate and regular rhythm.  Pulmonary:     Effort: Pulmonary effort is normal.     Breath sounds: Examination of the right-upper field reveals rhonchi. Examination of the left-upper field reveals rhonchi. Examination of the right-middle field reveals rhonchi. Examination of the left-middle field reveals rhonchi. Examination of the right-lower field reveals rhonchi. Examination of the left-lower field reveals rhonchi. Rhonchi present.  Skin:    General: Skin is warm and dry.  Neurological:     General: No focal deficit present.     Mental Status: She is alert and oriented to person, place, and time.  Psychiatric:        Mood and Affect: Mood normal.        Behavior: Behavior normal.  Thought Content: Thought content normal.        Judgment: Judgment normal.      Impression and Plan:  Acute bronchitis, unspecified organism -     Amoxicillin -Pot Clavulanate; Take 1 tablet by mouth 2 (two) times daily for 7 days.  Dispense: 14 tablet; Refill: 0   - With diffuse rhonchi and wheezes suspect she has acute bronchitis.  Will treat with Augmentin , she already has an albuterol  inhaler.  Time spent:30 minutes reviewing chart, interviewing and examining patient and formulating plan of care.     [name], MD [facility]     [1] No Known Allergies [2]   Current Outpatient Medications:    albuterol  (VENTOLIN  HFA) 108 (90 Base) MCG/ACT inhaler, Inhale 1-2 puffs into the lungs every 6 (six) hours as needed for wheezing or shortness of breath., Disp: 18 each, Rfl: 0   amiodarone  (PACERONE ) 200 MG tablet, Take 1 tablet (200 mg total) by mouth 2 (two) times daily., Disp: 60 tablet, Rfl: 3   amoxicillin -clavulanate (AUGMENTIN ) 875-125 MG tablet, Take 1 tablet by mouth 2 (two) times daily for 7 days., Disp: 14 tablet, Rfl: 0   levothyroxine  (SYNTHROID ) 137 MCG tablet, Take 1 tablet (137 mcg total) by mouth daily before breakfast. TAKE 1 TABLET BY MOUTH EVERY DAY BEFORE BREAKFAST, Disp: 90 tablet, Rfl: 1   losartan  (COZAAR ) 25 MG tablet, Take 25 mg by mouth daily., Disp: , Rfl:    risperiDONE (RISPERDAL) 0.5 MG tablet, Take 0.5 mg by mouth at bedtime., Disp: , Rfl:    rosuvastatin (CRESTOR) 10 MG tablet, Take 10 mg by mouth at bedtime., Disp: , Rfl:    sertraline (ZOLOFT) 100 MG tablet, Take 150 mg by mouth daily., Disp: , Rfl:    traZODone (DESYREL) 150 MG tablet, Take 150 mg by mouth at bedtime., Disp: , Rfl:    warfarin (COUMADIN ) 4 MG tablet, Take 1 tablet (4 mg total) by mouth daily at 6 PM., Disp: 35 tablet, Rfl: 3  Current Facility-Administered Medications:    [START ON 06/29/2024] denosumab -bbdz (JUBBONTI ) injection 60 mg, 60 mg, Subcutaneous, Once, Theophilus Andrews, Tully GRADE, MD  "

## 2024-03-15 ENCOUNTER — Other Ambulatory Visit: Payer: Self-pay

## 2024-03-15 MED ORDER — DENOSUMAB-BBDZ 60 MG/ML ~~LOC~~ SOSY
60.0000 mg | PREFILLED_SYRINGE | SUBCUTANEOUS | 0 refills | Status: AC
  Filled 2024-04-01: qty 1, 180d supply, fill #0

## 2024-03-15 NOTE — Progress Notes (Signed)
 Order placed for next Jub[employer]i . PA already received for this injection.

## 2024-03-15 NOTE — Addendum Note (Signed)
 Addended by: DIONISIO COLLIE PARAS on: 03/15/2024 08:36 AM   Modules accepted: Orders

## 2024-03-16 ENCOUNTER — Other Ambulatory Visit: Payer: Self-pay

## 2024-03-16 NOTE — Progress Notes (Signed)
 See benefits inv encounter from 10/14. Test claim shows copay is $5.10

## 2024-03-21 ENCOUNTER — Other Ambulatory Visit: Payer: Self-pay

## 2024-03-22 ENCOUNTER — Other Ambulatory Visit: Payer: Self-pay | Admitting: Internal Medicine

## 2024-03-22 DIAGNOSIS — E559 Vitamin D deficiency, unspecified: Secondary | ICD-10-CM

## 2024-03-25 ENCOUNTER — Other Ambulatory Visit: Payer: Self-pay

## 2024-03-27 ENCOUNTER — Other Ambulatory Visit: Payer: Self-pay | Admitting: Internal Medicine

## 2024-03-27 DIAGNOSIS — R052 Subacute cough: Secondary | ICD-10-CM

## 2024-03-27 DIAGNOSIS — R062 Wheezing: Secondary | ICD-10-CM

## 2024-03-28 ENCOUNTER — Other Ambulatory Visit: Payer: Self-pay

## 2024-03-31 ENCOUNTER — Other Ambulatory Visit: Payer: Self-pay | Admitting: Internal Medicine

## 2024-03-31 DIAGNOSIS — E039 Hypothyroidism, unspecified: Secondary | ICD-10-CM

## 2024-04-01 ENCOUNTER — Other Ambulatory Visit: Payer: Self-pay

## 2024-04-05 ENCOUNTER — Other Ambulatory Visit: Payer: Self-pay

## 2024-04-05 NOTE — Progress Notes (Signed)
 Patient has not returned calls. Dis-enrolling

## 2024-06-30 ENCOUNTER — Ambulatory Visit
